# Patient Record
Sex: Male | Born: 1950
Health system: Southern US, Community
[De-identification: ages and names within clinical notes are randomized; demographics above are authoritative.]

## PROBLEM LIST (undated history)

## (undated) DIAGNOSIS — L709 Acne, unspecified: Secondary | ICD-10-CM

## (undated) DIAGNOSIS — M79605 Pain in left leg: Secondary | ICD-10-CM

## (undated) DIAGNOSIS — M51379 Other intervertebral disc degeneration, lumbosacral region without mention of lumbar back pain or lower extremity pain: Secondary | ICD-10-CM

## (undated) DIAGNOSIS — F419 Anxiety disorder, unspecified: Secondary | ICD-10-CM

## (undated) DIAGNOSIS — M79604 Pain in right leg: Secondary | ICD-10-CM

## (undated) DIAGNOSIS — K5730 Diverticulosis of large intestine without perforation or abscess without bleeding: Secondary | ICD-10-CM

## (undated) DIAGNOSIS — M5137 Other intervertebral disc degeneration, lumbosacral region: Secondary | ICD-10-CM

## (undated) DIAGNOSIS — M545 Low back pain, unspecified: Secondary | ICD-10-CM

## (undated) HISTORY — DX: Diverticulosis of large intestine without perforation or abscess without bleeding: K57.30

## (undated) HISTORY — DX: Other intervertebral disc degeneration, lumbosacral region without mention of lumbar back pain or lower extremity pain: M51.379

## (undated) HISTORY — PX: TONSILLECTOMY: SUR1361

## (undated) HISTORY — DX: Pain in left leg: M79.605

## (undated) HISTORY — PX: REFRACTIVE SURGERY: SHX103

## (undated) HISTORY — DX: Low back pain, unspecified: M54.50

## (undated) HISTORY — DX: Acne, unspecified: L70.9

## (undated) HISTORY — PX: HERNIA REPAIR: SHX51

## (undated) HISTORY — DX: Low back pain: M54.5

## (undated) HISTORY — PX: COLONOSCOPY: SHX174

## (undated) HISTORY — DX: Pain in right leg: M79.604

## (undated) HISTORY — DX: Other intervertebral disc degeneration, lumbosacral region: M51.37

---

## 2010-09-22 ENCOUNTER — Ambulatory Visit (INDEPENDENT_AMBULATORY_CARE_PROVIDER_SITE_OTHER): Payer: BC Managed Care – PPO | Admitting: Internal Medicine

## 2010-09-22 ENCOUNTER — Other Ambulatory Visit (INDEPENDENT_AMBULATORY_CARE_PROVIDER_SITE_OTHER): Payer: BC Managed Care – PPO

## 2010-09-22 ENCOUNTER — Encounter: Payer: Self-pay | Admitting: Internal Medicine

## 2010-09-22 VITALS — BP 118/80 | HR 60 | Temp 98.6°F | Resp 16 | Ht 69.0 in | Wt 215.0 lb

## 2010-09-22 DIAGNOSIS — M545 Low back pain, unspecified: Secondary | ICD-10-CM | POA: Insufficient documentation

## 2010-09-22 DIAGNOSIS — IMO0002 Reserved for concepts with insufficient information to code with codable children: Secondary | ICD-10-CM

## 2010-09-22 DIAGNOSIS — Z0000 Encounter for general adult medical examination without abnormal findings: Secondary | ICD-10-CM | POA: Insufficient documentation

## 2010-09-22 DIAGNOSIS — Z136 Encounter for screening for cardiovascular disorders: Secondary | ICD-10-CM

## 2010-09-22 DIAGNOSIS — M5416 Radiculopathy, lumbar region: Secondary | ICD-10-CM

## 2010-09-22 LAB — COMPREHENSIVE METABOLIC PANEL
Albumin: 3.7 g/dL (ref 3.5–5.2)
BUN: 14 mg/dL (ref 6–23)
CO2: 26 mEq/L (ref 19–32)
Calcium: 8.7 mg/dL (ref 8.4–10.5)
GFR: 82.64 mL/min (ref >60.00)
Glucose, Bld: 95 mg/dL (ref 70–99)
Potassium: 4.5 mEq/L (ref 3.5–5.1)
Sodium: 141 mEq/L (ref 135–145)
Total Protein: 6.7 g/dL (ref 6.0–8.3)

## 2010-09-22 LAB — CBC WITH DIFFERENTIAL/PLATELET
Basophils Relative: 0.3 % (ref 0.0–3.0)
Eosinophils Relative: 2.1 % (ref 0.0–5.0)
HCT: 44 % (ref 39.0–52.0)
MCV: 90.1 fl (ref 78.0–100.0)
Monocytes Absolute: 0.3 10*3/uL (ref 0.1–1.0)
Monocytes Relative: 7 % (ref 3.0–12.0)
Neutrophils Relative %: 52.5 % (ref 43.0–77.0)
Platelets: 214 10*3/uL (ref 150.0–400.0)
RBC: 4.89 Mil/uL (ref 4.22–5.81)
WBC: 5 10*3/uL (ref 4.5–10.5)

## 2010-09-22 LAB — LIPID PANEL
HDL: 73.5 mg/dL (ref >39.00)
Total CHOL/HDL Ratio: 3
VLDL: 8.4 mg/dL (ref 0.0–40.0)

## 2010-09-22 LAB — TSH: TSH: 1.15 u[IU]/mL (ref 0.35–5.50)

## 2010-09-22 LAB — PSA: PSA: 1.03 ng/mL (ref 0.10–4.00)

## 2010-09-22 MED ORDER — NAPROXEN-ESOMEPRAZOLE 500-20 MG PO TBEC: 1.0000 | DELAYED_RELEASE_TABLET | Freq: Two times a day (BID) | ORAL | Status: DC | PRN

## 2010-09-22 NOTE — Assessment & Plan Note (Addendum)
Routine labs ordered, EKG is normal, pt ed material given, screening colonoscopy ordered

## 2010-09-22 NOTE — Assessment & Plan Note (Signed)
I have ordered an MRI to look for HNP, spinal stenosis, DDD, tumor, etc.

## 2010-09-22 NOTE — Assessment & Plan Note (Signed)
Will check an MRI to look for hnp, mass, spinal stenosis, nerve impingement, etc.

## 2010-09-22 NOTE — Patient Instructions (Signed)
Back Pain (Lumbosacral Strain) Back pain is one of the most common causes of pain. There are many causes of back pain. Most are not serious conditions.  CAUSES Your backbone (spinal column) is made up of 24 main vertebral bodies, the sacrum, and the coccyx. These are held together by muscles and tough, fibrous tissue (ligaments). Nerve roots pass through the openings between the vertebrae. A sudden move or injury to the back may cause injury to, or pressure on, these nerves. This may result in localized back pain or pain movement (radiation) into the buttocks, down the leg, and into the foot. Sharp, shooting pain from the buttock down the back of the leg (sciatica) is frequently associated with a ruptured (herniated) disc. Pain may be caused by muscle spasm alone. Your caregiver can often find the cause of your pain by the details of your symptoms and an exam. In some cases, you may need tests (such as X-rays). Your caregiver will work with you to decide if any tests are needed based on your specific exam. HOME CARE INSTRUCTIONS  Avoid an underactive lifestyle. Active exercise, as directed by your caregiver, is your greatest weapon against back pain.   Avoid hard physical activities (tennis, racquetball, water-skiing) if you are not in proper physical condition for it. This may aggravate and/or create problems.   If you have a back problem, avoid sports requiring sudden body movements. Swimming and walking are generally safer activities.   Maintain good posture.   Avoid becoming overweight (obese).   Use bed rest for only the most extreme, sudden (acute) episode. Your caregiver will help you determine how much bed rest is necessary.   For acute conditions, you may put ice on the injured area.   Put ice in a plastic bag.   Place a towel between your skin and the bag.   Leave the ice on for 20 minutes at a time, every 2 hours, or as needed.   After you are improved and more active, it may  help to apply heat for 30 minutes before activities.  See your caregiver if you are having pain that lasts longer than expected. Your caregiver can advise appropriate exercises and/or therapy if needed. With conditioning, most back problems can be avoided. SEEK IMMEDIATE MEDICAL CARE IF:  You have numbness, tingling, weakness, or problems with the use of your arms or legs.   You experience severe back pain not relieved with medicines.   There is a change in bowel or bladder control.   You have increasing pain in any area of the body, including your belly (abdomen).   You notice shortness of breath, dizziness, or feel faint.   You feel sick to your stomach (nauseous), are throwing up (vomiting), or become sweaty.   You notice discoloration of your toes or legs, or your feet get very cold.   Your back pain is getting worse.  You have an oral temperature above 100.5Health Maintenance in Males MAINTAIN REGULAR HEALTH EXAMS Maintain a healthy diet and normal weight. Increased weight leads to problems with blood pressure and diabetes. Decrease fat in the diet and increase exercise. Obtain a proper diet from your caregiver if necessary.  High blood pressure causes heart and blood vessel problems. Check blood pressures regularly and keep your blood pressure at normal limits. Aerobic exercise helps this. Persistent elevations of blood pressure should be treated with medications if weight loss and exercise are ineffective.  Avoid smoking, drinking in excess (more than 2 drinks per  day), or use of street drugs. Do not share needles with anyone. Ask for help if you need assistance or instructions on stopping the use of alcohol, cigarettes, or drugs.  Maintain normal blood lipids and cholesterol. Your caregiver can give you information to lower your risk of heart disease or stroke.  Ask your caregiver if you are in need of early heart disease screening because of a strong family history of heart disease  or signs of elevated testosterone (male sex hormone) levels. These can predispose you to early heart disease.  Practice safe sex. Practicing safe sex decreases your risk for a sexually transmitted infection (STI). Some of the STIs are gonorrhea, chlamydia, syphilis, trichimonas, herpes, human papillomavirus (HPV), and human immunodeficiency virus (HIV). Herpes, HIV, and HPV are viral illnesses that have no cure. These can result in disability, cancer, and death.  It is not safe for someone who has AIDS or is HIV positive to have unprotected sex with a partner who is HIV positive. The reason for this is the fact that there are many different strains of HIV. If you have a strain that is readily treated with medications and then suddenly introduce a strain from a partner that has no further treatment options, you may suddenly have a strain of HIV that is untreatable. Even if you are both positive for HIV, it is still necessary to practice safe sex.  Use sunscreen with a SPF of 15 or greater. Being outside in the sun when your shadow caused by the sun is shorter than you are, means you are being exposed to sun at greater intensity. Lighter skinned people are at a greater risk of skin cancer.  Keep carbon monoxide and smoke detectors in your home and functioning at all times. Change the batteries every 6 months.  Do monthly examinations of your testicles. The best time to do this is after a hot shower or bath when the tissues are loose. Notify your caregivers of any lumps, tenderness, or changes in size or shape.  Notify your caregiver of new moles or changes in moles, especially if there is a change in shape or color. Also notify your caregiver if a mole is larger than the size of a pencil eraser.  Stay current with your tetanus shots and other required immunizations.  The Body Mass Index (BMI) is a way of measuring how much of your body is fat. Having a BMI above 27 increases the risk of heart disease,  diabetes, hypertension, stroke, and other problems related to obesity. Document Released: 11/05/2007 Document Re-Released: 10/27/2009  [facility] Patient Information 2011 [place], [place]., not controlled by medicine.  MAKE SURE YOU:   Understand these instructions.   Will watch your condition.   Will get help right away if you are not doing well or get worse.  Document Released: 02/16/2005 Document Re-Released: 08/03/2009 [facility] Patient Information 2011 [place], [place].

## 2010-09-22 NOTE — Assessment & Plan Note (Addendum)
Start vimovo for pain and check labs to look for secondary causes

## 2010-09-22 NOTE — Progress Notes (Signed)
Subjective:    Patient ID: Daniel [name], male    DOB: 1951/05/01, 60 y.o.   MRN: [id number]  Back Pain This is a chronic problem. The current episode started more than 1 year ago. The problem occurs constantly. The problem has been gradually worsening since onset. The pain is present in the lumbar spine. The quality of the pain is described as stabbing and shooting. The pain radiates to the left knee and right knee. The pain is at a severity of 4/10. The pain is mild. The pain is worse during the day. The symptoms are aggravated by bending. Stiffness is present all day. Pertinent negatives include no abdominal pain, bladder incontinence, bowel incontinence, chest pain, dysuria, fever, headaches, leg pain, numbness, paresis, paresthesias, pelvic pain, perianal numbness, tingling, weakness or weight loss. Risk factors include sedentary lifestyle. He has tried chiropractic manipulation for the symptoms. The treatment provided no relief.   Also, he wants to do a complete physical today.   Review of Systems  Constitutional: Positive for unexpected weight change (weight gain). Negative for fever, chills, weight loss, diaphoresis, activity change, appetite change and fatigue.  HENT: Negative for facial swelling, neck pain and neck stiffness.   Eyes: Negative for photophobia and visual disturbance.  Respiratory: Negative for apnea, cough, choking, chest tightness, shortness of breath, wheezing and stridor.   Cardiovascular: Negative for chest pain, palpitations and leg swelling.  Gastrointestinal: Negative for nausea, vomiting, abdominal pain, diarrhea, blood in stool and bowel incontinence.  Genitourinary: Negative for bladder incontinence, dysuria, urgency, frequency, hematuria, flank pain, decreased urine volume, discharge, penile swelling, scrotal swelling, enuresis, difficulty urinating, genital sores, penile pain, testicular pain and pelvic pain.  Musculoskeletal: Positive for back pain. Negative  for myalgias, joint swelling, arthralgias and gait problem.  Skin: Negative for color change, pallor and rash.  Neurological: Negative for dizziness, tingling, tremors, seizures, syncope, facial asymmetry, speech difficulty, weakness, light-headedness, numbness, headaches and paresthesias.  Hematological: Negative for adenopathy. Does not bruise/bleed easily.  Psychiatric/Behavioral: Negative for suicidal ideas, hallucinations, behavioral problems, confusion, sleep disturbance, self-injury, dysphoric mood, decreased concentration and agitation. The patient is not nervous/anxious and is not hyperactive.        Objective:   Physical Exam  Vitals reviewed. Constitutional: He appears well-developed and well-nourished. No distress.  HENT:  Head: Normocephalic and atraumatic.  Right Ear: External ear normal.  Left Ear: External ear normal.  Nose: Nose normal.  Mouth/Throat: Oropharynx is clear and moist. No oropharyngeal exudate.  Eyes: Conjunctivae and EOM are normal. Pupils are equal, round, and reactive to light. Right eye exhibits no discharge. Left eye exhibits no discharge. No scleral icterus.  Neck: Normal range of motion. Neck supple. No JVD present. No tracheal deviation present. No thyromegaly present.  Cardiovascular: Normal rate, regular rhythm, normal heart sounds and intact distal pulses.  Exam reveals no gallop and no friction rub.   No murmur heard. Pulmonary/Chest: Breath sounds normal. No stridor. No respiratory distress. He has no wheezes. He has no rales. He exhibits no tenderness.  Abdominal: Soft. Bowel sounds are normal. He exhibits no distension and no mass. There is no tenderness. There is no rebound and no guarding. Hernia confirmed negative in the right inguinal area and confirmed negative in the left inguinal area.  Genitourinary: Rectum normal, prostate normal, testes normal and penis normal. Rectal exam shows no external hemorrhoid, no internal hemorrhoid, no fissure,  no mass, no tenderness and anal tone normal. Guaiac negative stool. Prostate is not enlarged and not tender. Right  testis shows no mass, no swelling and no tenderness. Left testis shows no mass, no swelling and no tenderness. Circumcised. No penile erythema or penile tenderness. No discharge found.  Musculoskeletal: He exhibits no edema and no tenderness.       Lumbar back: He exhibits decreased range of motion and pain. He exhibits no tenderness, no bony tenderness, no swelling, no edema, no deformity and no spasm.  Lymphadenopathy:    He has no cervical adenopathy.       Right: No inguinal adenopathy present.       Left: No inguinal adenopathy present.  Neurological: He is alert. He has normal strength. He is not disoriented. He displays no atrophy, no tremor and normal reflexes. No cranial nerve deficit or sensory deficit. He exhibits normal muscle tone. He displays a negative Romberg sign. He displays no seizure activity. Coordination and gait normal.  Reflex Scores:      Tricep reflexes are 1+ on the right side and 1+ on the left side.      Bicep reflexes are 1+ on the right side and 1+ on the left side.      Brachioradialis reflexes are 1+ on the right side and 1+ on the left side.      Patellar reflexes are 1+ on the right side and 1+ on the left side.      Achilles reflexes are 1+ on the right side and 1+ on the left side. Skin: Skin is warm and dry. No rash noted. He is not diaphoretic. No erythema. No pallor.  Psychiatric: He has a normal mood and affect. His behavior is normal. Judgment and thought content normal.          Assessment & Plan:

## 2010-09-28 ENCOUNTER — Ambulatory Visit (AMBULATORY_SURGERY_CENTER): Payer: BC Managed Care – PPO

## 2010-09-28 ENCOUNTER — Encounter: Payer: Self-pay | Admitting: Gastroenterology

## 2010-09-28 VITALS — Ht 69.0 in | Wt 219.3 lb

## 2010-09-28 DIAGNOSIS — Z1211 Encounter for screening for malignant neoplasm of colon: Secondary | ICD-10-CM

## 2010-09-28 MED ORDER — PEG-KCL-NACL-NASULF-NA ASC-C 100 G PO SOLR: 1.0000 | Freq: Once | ORAL | Status: AC

## 2010-09-30 ENCOUNTER — Encounter: Payer: Self-pay | Admitting: Internal Medicine

## 2010-09-30 ENCOUNTER — Ambulatory Visit (HOSPITAL_COMMUNITY)
Admission: RE | Admit: 2010-09-30 | Discharge: 2010-09-30 | Disposition: A | Discharge status: Home / Self Care | Payer: BC Managed Care – PPO | Source: Ambulatory Visit | Attending: Internal Medicine | Admitting: Internal Medicine

## 2010-09-30 DIAGNOSIS — M5137 Other intervertebral disc degeneration, lumbosacral region: Secondary | ICD-10-CM | POA: Insufficient documentation

## 2010-09-30 DIAGNOSIS — M545 Low back pain, unspecified: Secondary | ICD-10-CM | POA: Insufficient documentation

## 2010-09-30 DIAGNOSIS — M51379 Other intervertebral disc degeneration, lumbosacral region without mention of lumbar back pain or lower extremity pain: Secondary | ICD-10-CM | POA: Insufficient documentation

## 2010-09-30 DIAGNOSIS — M47817 Spondylosis without myelopathy or radiculopathy, lumbosacral region: Secondary | ICD-10-CM | POA: Insufficient documentation

## 2010-09-30 DIAGNOSIS — R209 Unspecified disturbances of skin sensation: Secondary | ICD-10-CM | POA: Insufficient documentation

## 2010-09-30 DIAGNOSIS — Q7649 Other congenital malformations of spine, not associated with scoliosis: Secondary | ICD-10-CM | POA: Insufficient documentation

## 2010-09-30 DIAGNOSIS — M5416 Radiculopathy, lumbar region: Secondary | ICD-10-CM

## 2010-09-30 DIAGNOSIS — M79609 Pain in unspecified limb: Secondary | ICD-10-CM | POA: Insufficient documentation

## 2010-10-01 ENCOUNTER — Telehealth: Payer: Self-pay | Admitting: *Deleted

## 2010-10-01 DIAGNOSIS — M5416 Radiculopathy, lumbar region: Secondary | ICD-10-CM

## 2010-10-01 DIAGNOSIS — M545 Low back pain, unspecified: Secondary | ICD-10-CM

## 2010-10-01 MED ORDER — NAPROXEN-ESOMEPRAZOLE 500-20 MG PO TBEC: 1.0000 | DELAYED_RELEASE_TABLET | Freq: Two times a day (BID) | ORAL | Status: DC | PRN

## 2010-10-01 NOTE — Telephone Encounter (Signed)
done

## 2010-10-01 NOTE — Telephone Encounter (Signed)
Patient informed - Pharm told pt that he needed PA. Explained PA process, no samples avail at this time. He is taking otc aleve but says vimovo helps more than otc med. Advised pt take some PPI/acid reducer of choice while taking aleve to protect his stomach and would for forward with PA   SHARON, please help, when you see this PA, I will help complete to get covered. THANKS!

## 2010-10-01 NOTE — Telephone Encounter (Signed)
Patient requesting RX for "pain killer" he was given samples of at [facility].

## 2010-10-06 NOTE — Telephone Encounter (Signed)
Completed form faxed for Approval.

## 2010-10-06 NOTE — Telephone Encounter (Signed)
[place], [place] # [phone] ID# [id number] Form received, completed and pending signature

## 2010-10-06 NOTE — Telephone Encounter (Signed)
Still have not seen/recvd PA for this Pt.?

## 2010-10-07 NOTE — Telephone Encounter (Signed)
Approval recvd & faxed to pharmacy. Pt informed.

## 2010-10-11 ENCOUNTER — Ambulatory Visit (AMBULATORY_SURGERY_CENTER): Payer: BC Managed Care – PPO | Admitting: Gastroenterology

## 2010-10-11 ENCOUNTER — Encounter: Payer: Self-pay | Admitting: Gastroenterology

## 2010-10-11 VITALS — BP 129/76 | HR 56 | Temp 99.0°F | Resp 20 | Ht 69.0 in | Wt 215.0 lb

## 2010-10-11 DIAGNOSIS — Z1211 Encounter for screening for malignant neoplasm of colon: Secondary | ICD-10-CM

## 2010-10-11 DIAGNOSIS — K5730 Diverticulosis of large intestine without perforation or abscess without bleeding: Secondary | ICD-10-CM | POA: Insufficient documentation

## 2010-10-11 MED ORDER — SODIUM CHLORIDE 0.9 % IV SOLN: 500.0000 mL | INTRAVENOUS | Status: DC

## 2010-10-11 NOTE — Patient Instructions (Signed)
Discharged instructions given with verbal understanding. Handout on diverticulosis given. Resume previous medications. 

## 2010-10-12 ENCOUNTER — Telehealth: Payer: Self-pay

## 2010-10-12 NOTE — Telephone Encounter (Signed)
Left message on answering machine. 

## 2010-11-23 ENCOUNTER — Encounter: Payer: Self-pay | Admitting: Internal Medicine

## 2010-11-23 ENCOUNTER — Ambulatory Visit (INDEPENDENT_AMBULATORY_CARE_PROVIDER_SITE_OTHER): Payer: BC Managed Care – PPO | Admitting: Internal Medicine

## 2010-11-23 ENCOUNTER — Other Ambulatory Visit: Payer: Self-pay | Admitting: *Deleted

## 2010-11-23 VITALS — BP 148/92 | HR 77 | Temp 98.0°F | Resp 14 | Wt 214.2 lb

## 2010-11-23 DIAGNOSIS — J209 Acute bronchitis, unspecified: Secondary | ICD-10-CM

## 2010-11-23 MED ORDER — HYDROCODONE-HOMATROPINE 5-1.5 MG/5ML PO SYRP: 5.0000 mL | ORAL_SOLUTION | Freq: Four times a day (QID) | ORAL | Status: AC | PRN

## 2010-11-23 MED ORDER — LEVOFLOXACIN 500 MG PO TABS: 500.0000 mg | ORAL_TABLET | Freq: Every day | ORAL | Status: AC

## 2010-11-23 MED ORDER — VALACYCLOVIR HCL 1 G PO TABS: 1000.0000 mg | ORAL_TABLET | Freq: Three times a day (TID) | ORAL | Status: AC | PRN

## 2010-11-23 NOTE — Progress Notes (Signed)
  Subjective:    Patient ID: Daniel [name], male    DOB: [birth year], 60 y.o.   MRN: [id number]  HPI Here with acute onset mild to mod 2-3 days ST, HA, general weakness and malaise, with prod cough greenish sputum, but Pt denies chest pain, increased sob or doe, wheezing, orthopnea, PND, increased LE swelling, palpitations, dizziness or syncope. Pt denies new neurological symptoms such as new headache, or facial or extremity weakness or numbness.  Pt denies polydipsia, polyuria. Past Medical History  Diagnosis Date  . Acne   . Low back pain radiating to both legs   . Neuromuscular disorder     chronic back pain   Past Surgical History  Procedure Date  . Hernia repair   . Tonsillectomy   . Refractive surgery     left eye/ with enhancement    reports that he has never smoked. He has never used smokeless tobacco. He reports that he drinks about 8.4 ounces of alcohol per week. He reports that he does not use illicit drugs. family history includes Arthritis in his father and mother; Diabetes in his mother; Heart disease in his father; and Hypertension in his mother.  There is no history of Cancer. No Known Allergies Current Outpatient Prescriptions on File Prior to Visit  Medication Sig Dispense Refill  . DOXYCYCLINE, ROSACEA, PO Take by mouth as needed.       Marland Kitchen glucosamine-chondroitin 500-400 MG tablet Take 1 tablet by mouth every morning.        . Multiple Vitamins-Minerals (SENIOR MULTIVITAMIN PLUS) TABS Take 1 tablet by mouth every morning.        . Naproxen-Esomeprazole 500-20 MG TBEC Take 1 tablet by mouth 2 (two) times daily as needed (pain).  60 tablet  2  . Omega-3 Fatty Acids (FISH OIL) 1200 MG CAPS Take 1 capsule by mouth every morning.        . vitamin B-12 (CYANOCOBALAMIN) 1000 MCG tablet Take 1,000 mcg by mouth daily.         Current Facility-Administered Medications on File Prior to Visit  Medication Dose Route Frequency Provider Last Rate Last Dose  . 0.9 %  sodium chloride  infusion  500 mL Intravenous Continuous Sheryn Bison, MD       Review of Systems Review of Systems  Constitutional: Negative for diaphoresis and unexpected weight change.  HENT: Negative for drooling and tinnitus.   Eyes: Negative for photophobia and visual disturbance.  Respiratory: Negative for choking and stridor.         Objective:   Physical Exam BP 148/92  Pulse 77  Temp(Src) 98 F (36.7 C) (Oral)  Resp 14  Wt 214 lb 4 oz (97.183 kg)  SpO2 97%Physical Exam  VS noted, mild ill Constitutional: Pt appears well-developed and well-nourished.  HENT: Head: Normocephalic.  Right Ear: External ear normal.  Left Ear: External ear normal.  Bilat tm's mild erythema.  Sinus nontender.  Pharynx mild erythema Eyes: Conjunctivae and EOM are normal. Pupils are equal, round, and reactive to light.  Neck: Normal range of motion. Neck supple.  Cardiovascular: Normal rate and regular rhythm.   Pulmonary/Chest: Effort normal and breath sounds normal.  Neurological: Pt is alert. No cranial nerve deficit.  Skin: Skin is warm. No erythema.  Psychiatric: Pt behavior is normal. Thought content normal.         Assessment & Plan:

## 2010-11-23 NOTE — Assessment & Plan Note (Signed)
Mild to mod, for antibx course,  to f/u any worsening symptoms or concerns 

## 2010-11-23 NOTE — Patient Instructions (Signed)
Take all new medications as prescribed Continue all other medications as before  

## 2011-04-11 ENCOUNTER — Telehealth: Payer: Self-pay | Admitting: Gastroenterology

## 2011-04-11 ENCOUNTER — Telehealth: Payer: Self-pay

## 2011-04-11 NOTE — Telephone Encounter (Signed)
Patient called upset that he was billed for a colonoscopy in which his insurance company pays for as preventative. I advised patient that billing would have come from GI Dr. Jarold Motto office. I recommended that patient calls their number to resolve. Patient was given phone number and transferred to their floor.

## 2011-04-12 NOTE — Telephone Encounter (Signed)
Told patient that his colonscopy was supposed to be filed as a screening colonoscopy and not a diagnostic.  I emailed Ina Kick to get this corrected and refiled.

## 2011-04-19 ENCOUNTER — Encounter: Payer: Self-pay | Admitting: Internal Medicine

## 2011-04-19 ENCOUNTER — Ambulatory Visit (INDEPENDENT_AMBULATORY_CARE_PROVIDER_SITE_OTHER): Payer: BC Managed Care – PPO | Admitting: Internal Medicine

## 2011-04-19 VITALS — BP 112/90 | HR 74 | Temp 98.7°F | Ht 69.0 in | Wt 218.5 lb

## 2011-04-19 DIAGNOSIS — M5137 Other intervertebral disc degeneration, lumbosacral region: Secondary | ICD-10-CM

## 2011-04-19 DIAGNOSIS — IMO0002 Reserved for concepts with insufficient information to code with codable children: Secondary | ICD-10-CM

## 2011-04-19 DIAGNOSIS — M545 Low back pain, unspecified: Secondary | ICD-10-CM

## 2011-04-19 DIAGNOSIS — M5416 Radiculopathy, lumbar region: Secondary | ICD-10-CM

## 2011-04-19 MED ORDER — BUPRENORPHINE 10 MCG/HR TD PTWK: 1.0000 | MEDICATED_PATCH | TRANSDERMAL | Status: DC

## 2011-04-19 MED ORDER — METHYLPREDNISOLONE ACETATE 80 MG/ML IJ SUSP
120.0000 mg | Freq: Once | INTRAMUSCULAR | Status: AC
  Administered 2011-04-19: 120 mg via INTRAMUSCULAR

## 2011-04-19 NOTE — Patient Instructions (Signed)
Degenerative Disc Disease Degenerative disc disease is a condition caused by the changes that occur in the cushions of the backbone (spinal discs) as you grow older. Spinal discs are soft and compressible discs located between the bones of the spine (vertebrae). They act like shock absorbers. Degenerative disc disease can affect the wholespine. However, the neck and lower back are most commonly affected. Many changes can occur in the spinal discs with aging, such as:  The spinal discs may dry and shrink.   Small tears may occur in the tough, outer covering of the disc (annulus).   The disc space may become smaller due to loss of water.   Abnormal growths in the bone (spurs) may occur. This can put pressure on the nerve roots exiting the spinal canal, causing pain.   The spinal canal may become narrowed.  CAUSES  Degenerative disc disease is a condition caused by the changes that occur in the spinal discs with aging. The exact cause is not known, but there is a genetic basis for many patients. Degenerative changes can occur due to loss of fluid in the disc. This makes the disc thinner and reduces the space between the backbones. Small cracks can develop in the outer layer of the disc. This can lead to the breakdown of the disc. You are more likely to get degenerative disc disease if you are overweight. Smoking cigarettes and doing heavy work such as weightlifting can also increase your risk of this condition. Degenerative changes can start after a sudden injury. Growth of bone spurs can compress the nerve roots and cause pain.  SYMPTOMS  The symptoms vary from person to person. Some people may have no pain, while others have severe pain. The pain may be so severe that it can limit your activities. The location of the pain depends on the part of your backbone that is affected. You will have neck or arm pain if a disc in the neck area is affected. You will have pain in your back, buttocks, or legs if a  disc in the lower back is affected. The pain becomes worse while bending, reaching up, or with twisting movements. The pain may start gradually and then get worse as time passes. It may also start after a major or minor injury. You may feel numbness or tingling in the arms or legs.  DIAGNOSIS  Your caregiver will ask you about your symptoms and about activities or habits that may cause the pain. He or she may also ask about any injuries, diseases, ortreatments you have had earlier. Your caregiver will examine you to check for the range of movement that is possible in the affected area, to check for strength in your extremities, and to check for sensation in the areas of the arms and legs supplied by different nerve roots. An X-ray of the spine may be taken. Your caregiver may suggest other imaging tests, such as a computerized magnetic scan (MRI), if needed.  TREATMENT  Treatment includes rest, modifying your activities, and applying ice and heat. Your caregiver may prescribe medicines to reduce your pain and may ask you to do some exercises to strengthen your back. In some cases, you may need surgery. You and your caregiver will decide on the treatment that is best for you. HOME CARE INSTRUCTIONS   Follow proper lifting and walking techniques as advised by your caregiver.   Maintain good posture.   Exercise regularly as advised.   Perform relaxation exercises.   Change your sitting,   standing, and sleeping habits as advised. Change positions frequently.   Lose weight as advised.   Stop smoking if you smoke.   Wear supportive footwear.  SEEK MEDICAL CARE IF:  The pain does not go away within 1 to 4 weeks. SEEK IMMEDIATE MEDICAL CARE IF:   The pain is severe.   You notice weakness in your arms, hands, or legs.   You begin to lose control of your bladder or bowel.  MAKE SURE YOU:   Understand these instructions.   Will watch your condition.   Will get help right away if you are not  doing well or get worse.  Document Released: 03/06/2007 Document Revised: 01/19/2011 Document Reviewed: 03/06/2007 [employer] Patient Information 2012 [employer]. 

## 2011-04-20 NOTE — Assessment & Plan Note (Signed)
He can continue the vimovo and will add on butrans patch as well

## 2011-04-20 NOTE — Assessment & Plan Note (Signed)
I will see if an injection of depo-medrol will help

## 2011-04-20 NOTE — Assessment & Plan Note (Signed)
His MRI shows DDD with nerve impingement so I have asked him to see pain management to see if an ESI will help, he may also consider surgery but he is not ready for that yet

## 2011-04-20 NOTE — Progress Notes (Signed)
Subjective:    Patient ID: Daniel [name], male    DOB: 1951-03-22, 60 y.o.   MRN: [id number]  Back Pain This is a recurrent problem. The current episode started in the past 7 days. The problem occurs constantly. The problem has been gradually worsening since onset. The pain is present in the lumbar spine. The quality of the pain is described as shooting. The pain radiates to the right thigh. The pain is at a severity of 4/10. The pain is moderate. The pain is worse during the day. The symptoms are aggravated by bending. Stiffness is present all day. Associated symptoms include leg pain. Pertinent negatives include no abdominal pain, bladder incontinence, bowel incontinence, chest pain, dysuria, fever, headaches, numbness, paresis, paresthesias, pelvic pain, perianal numbness, tingling, weakness or weight loss. Risk factors include lack of exercise and obesity. He has tried NSAIDs for the symptoms. The treatment provided moderate relief.      Review of Systems  Constitutional: Negative for fever, chills, weight loss, diaphoresis, activity change, appetite change, fatigue and unexpected weight change.  HENT: Negative.   Eyes: Negative.   Respiratory: Negative for cough, chest tightness, shortness of breath, wheezing and stridor.   Cardiovascular: Negative for chest pain, palpitations and leg swelling.  Gastrointestinal: Negative for nausea, vomiting, abdominal pain, diarrhea, constipation, abdominal distention and bowel incontinence.  Genitourinary: Negative for bladder incontinence, dysuria, urgency, frequency, hematuria, flank pain, decreased urine volume, enuresis, difficulty urinating and pelvic pain.  Musculoskeletal: Positive for back pain. Negative for myalgias, joint swelling, arthralgias and gait problem.  Skin: Negative for color change, pallor, rash and wound.  Neurological: Negative for dizziness, tingling, tremors, seizures, syncope, facial asymmetry, speech difficulty, weakness,  light-headedness, numbness, headaches and paresthesias.  Hematological: Negative for adenopathy. Does not bruise/bleed easily.  Psychiatric/Behavioral: Negative.        Objective:   Physical Exam  Vitals reviewed. Constitutional: He is oriented to person, place, and time. He appears well-developed and well-nourished. No distress.  HENT:  Head: Normocephalic and atraumatic.  Mouth/Throat: Oropharynx is clear and moist. No oropharyngeal exudate.  Eyes: Conjunctivae are normal. Right eye exhibits no discharge. Left eye exhibits no discharge. No scleral icterus.  Neck: Normal range of motion. Neck supple. No JVD present. No tracheal deviation present. No thyromegaly present.  Cardiovascular: Normal rate, regular rhythm, normal heart sounds and intact distal pulses.  Exam reveals no gallop and no friction rub.   No murmur heard. Pulmonary/Chest: Effort normal and breath sounds normal. No stridor. No respiratory distress. He has no wheezes. He has no rales. He exhibits no tenderness.  Abdominal: Soft. Bowel sounds are normal. He exhibits no distension and no mass. There is no tenderness. There is no rebound and no guarding.  Musculoskeletal: Normal range of motion. He exhibits no edema and no tenderness.       Lumbar back: Normal. He exhibits normal range of motion, no tenderness, no bony tenderness, no swelling, no edema, no deformity, no laceration, no pain, no spasm and normal pulse.       + SLR in right leg - SLR in left leg  Lymphadenopathy:    He has no cervical adenopathy.  Neurological: He is alert and oriented to person, place, and time. He displays no atrophy, no tremor and normal reflexes. No cranial nerve deficit or sensory deficit. He exhibits abnormal muscle tone. He displays a negative Romberg sign. He displays no seizure activity. Coordination and gait normal. He displays no Babinski's sign on the right side. He displays no  Babinski's sign on the left side.  Reflex Scores:       Tricep reflexes are 1+ on the right side and 1+ on the left side.      Bicep reflexes are 1+ on the right side and 1+ on the left side.      Brachioradialis reflexes are 1+ on the right side and 1+ on the left side.      Patellar reflexes are 1+ on the right side and 1+ on the left side.      Achilles reflexes are 1+ on the right side and 1+ on the left side.      He has mild weakness throughout his RLE  Skin: Skin is warm and dry. No rash noted. He is not diaphoretic. No erythema. No pallor.  Psychiatric: He has a normal mood and affect. His behavior is normal. Judgment and thought content normal.          Assessment & Plan:

## 2011-05-04 ENCOUNTER — Telehealth: Payer: Self-pay

## 2011-05-04 NOTE — Telephone Encounter (Signed)
Patient called lmovm c/o cold congestion.

## 2011-05-05 ENCOUNTER — Ambulatory Visit (INDEPENDENT_AMBULATORY_CARE_PROVIDER_SITE_OTHER): Payer: BC Managed Care – PPO | Admitting: Internal Medicine

## 2011-05-05 ENCOUNTER — Encounter: Payer: Self-pay | Admitting: Internal Medicine

## 2011-05-05 VITALS — BP 142/80 | HR 57 | Temp 98.2°F | Resp 16 | Wt 217.0 lb

## 2011-05-05 DIAGNOSIS — M545 Low back pain, unspecified: Secondary | ICD-10-CM

## 2011-05-05 DIAGNOSIS — J019 Acute sinusitis, unspecified: Secondary | ICD-10-CM

## 2011-05-05 MED ORDER — CEFUROXIME AXETIL 500 MG PO TABS: 500.0000 mg | ORAL_TABLET | Freq: Two times a day (BID) | ORAL | Status: DC

## 2011-05-05 NOTE — Patient Instructions (Signed)

## 2011-05-05 NOTE — Progress Notes (Signed)
Subjective:    Patient ID: Daniel [name], male    DOB: 1950-08-20, 60 y.o.   MRN: [id number]  Sinusitis This is a new problem. The current episode started in the past 7 days. The problem has been gradually worsening since onset. There has been no fever. His pain is at a severity of 0/10. He is experiencing no pain. Associated symptoms include chills, congestion, headaches, sinus pressure and a sore throat. Pertinent negatives include no coughing, diaphoresis, ear pain, hoarse voice, neck pain, shortness of breath, sneezing or swollen glands. Past treatments include nothing.      Review of Systems  Constitutional: Positive for chills and fatigue. Negative for fever, diaphoresis, activity change, appetite change and unexpected weight change.  HENT: Positive for congestion, sore throat and sinus pressure. Negative for ear pain, hoarse voice, facial swelling, sneezing, drooling, mouth sores, trouble swallowing, neck pain, neck stiffness, dental problem, voice change and ear discharge.   Eyes: Negative.   Respiratory: Negative for cough, shortness of breath, wheezing and stridor.   Cardiovascular: Negative for chest pain, palpitations and leg swelling.  Gastrointestinal: Negative for nausea, vomiting, abdominal pain, diarrhea, constipation, blood in stool and abdominal distention.  Genitourinary: Negative for dysuria, urgency, frequency, hematuria, flank pain, decreased urine volume, enuresis and difficulty urinating.  Musculoskeletal: Positive for back pain (pain is much better on butrans patch). Negative for myalgias, joint swelling, arthralgias and gait problem.  Skin: Negative for color change, pallor, rash and wound.  Neurological: Positive for headaches. Negative for dizziness, tremors, seizures, syncope, facial asymmetry, speech difficulty, weakness, light-headedness and numbness.  Hematological: Negative for adenopathy. Does not bruise/bleed easily.  Psychiatric/Behavioral: Negative.        Objective:   Physical Exam  Vitals reviewed. Constitutional: He is oriented to person, place, and time. He appears well-developed and well-nourished. No distress.  HENT:  Head: No trismus in the jaw.  Right Ear: Hearing, tympanic membrane, external ear and ear canal normal.  Left Ear: Hearing, tympanic membrane, external ear and ear canal normal.  Nose: No mucosal edema, rhinorrhea, nose lacerations, sinus tenderness, nasal deformity, septal deviation or nasal septal hematoma. No epistaxis.  No foreign bodies. Right sinus exhibits maxillary sinus tenderness and frontal sinus tenderness. Left sinus exhibits maxillary sinus tenderness and frontal sinus tenderness.  Mouth/Throat: Oropharynx is clear and moist and mucous membranes are normal. Mucous membranes are not pale, not dry and not cyanotic. No uvula swelling. No oropharyngeal exudate, posterior oropharyngeal edema, posterior oropharyngeal erythema or tonsillar abscesses.  Eyes: Conjunctivae are normal. Right eye exhibits no discharge. Left eye exhibits no discharge. No scleral icterus.  Neck: Normal range of motion. Neck supple. No JVD present. No tracheal deviation present. No thyromegaly present.  Cardiovascular: Normal rate, regular rhythm, normal heart sounds and intact distal pulses.  Exam reveals no gallop and no friction rub.   No murmur heard. Pulmonary/Chest: Effort normal and breath sounds normal. No stridor. No respiratory distress. He has no wheezes. He has no rales. He exhibits no tenderness.  Abdominal: Soft. Bowel sounds are normal.  Musculoskeletal: Normal range of motion. He exhibits no edema and no tenderness.  Lymphadenopathy:    He has no cervical adenopathy.  Neurological: He is oriented to person, place, and time.  Skin: Skin is warm and dry. No rash noted. He is not diaphoretic. No erythema. No pallor.  Psychiatric: He has a normal mood and affect. His behavior is normal. Judgment and thought content normal.  Assessment & Plan:

## 2011-05-05 NOTE — Assessment & Plan Note (Signed)
Pain is better on butrans patch

## 2011-05-05 NOTE — Assessment & Plan Note (Signed)
Start ceftin for the infection 

## 2011-05-06 ENCOUNTER — Ambulatory Visit: Payer: BC Managed Care – PPO | Admitting: Physical Medicine & Rehabilitation

## 2011-05-06 ENCOUNTER — Encounter: Payer: BC Managed Care – PPO | Attending: Physical Medicine & Rehabilitation

## 2011-05-06 DIAGNOSIS — M5137 Other intervertebral disc degeneration, lumbosacral region: Secondary | ICD-10-CM

## 2011-05-06 DIAGNOSIS — M549 Dorsalgia, unspecified: Secondary | ICD-10-CM | POA: Insufficient documentation

## 2011-05-06 DIAGNOSIS — M48061 Spinal stenosis, lumbar region without neurogenic claudication: Secondary | ICD-10-CM

## 2011-05-06 DIAGNOSIS — M79609 Pain in unspecified limb: Secondary | ICD-10-CM | POA: Insufficient documentation

## 2011-05-06 DIAGNOSIS — M47817 Spondylosis without myelopathy or radiculopathy, lumbosacral region: Secondary | ICD-10-CM | POA: Insufficient documentation

## 2011-05-06 DIAGNOSIS — M129 Arthropathy, unspecified: Secondary | ICD-10-CM | POA: Insufficient documentation

## 2011-05-06 DIAGNOSIS — IMO0002 Reserved for concepts with insufficient information to code with codable children: Secondary | ICD-10-CM

## 2011-05-06 DIAGNOSIS — M51379 Other intervertebral disc degeneration, lumbosacral region without mention of lumbar back pain or lower extremity pain: Secondary | ICD-10-CM | POA: Insufficient documentation

## 2011-05-09 NOTE — Consult Note (Signed)
CHIEF COMPLAINT:  Back pain and right lower extremity pain.  A 60 year old male who notes onset of back pain and right lower extremity pain somewhere around 2006.  He feels like it has worsened over the last several years.  He has been trialed on Vimovo which is a combination between naproxen and Prilosec.  This improved his driving tolerance from 1 hour to 1.5 hours, but really no other significant relief.  He was started on Butrans 10 mcg/hour and this does give him good relief.  However, he does not like the idea that he is on a narcotic and that it also makes him tired all the time.  His pain is worse with bending, sitting, also with twisting.  Ice does tend to relieve his pain.  His relief from meds is good.  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  He is up on his feet frequently.  He climbs steps.  He drives.  PAST MEDICAL HISTORY:  Fairly unremarkable.  He takes topical medications for rosacea.  He does take anti-fungal medication.  CURRENT MEDICATIONS: 1. Butrans 10 mcg/hour, changed every weeks. 2. Ceftin 500 mg b.i.d. 3. Doxycycline 1 p.o. daily, questioned dose. 4. Glucosamine chondroitin 1 daily. 5. Naproxen. 6. Omeprazole 1 p.o. b.i.d. 7. Omega-3 fatty acids q.a.m. 8. Lamisil 250 daily. 9. B12 1000 mcg q.24 h. as well.  PAST SURGICAL HISTORY:  Tonsillectomy in 1960, hernia in 2006.  SOCIAL HISTORY:  Single.  Alcohol use varies.  He will either have a drink or a glass of wine at night.  This is one of the reasons he really does want to be on the patch.  His blood pressure 139/93, pulse 50, respiration is regular, O2 sat 99% on room air.  Height 5 feet, 9 inches, weight 216 pounds.  In general, no acute distress.  Mood and affect appropriate.  His upper extremity strength is normal.  Upper extremity range of motion is normal.  Upper extremity sensation is normal.  His neck range of motion is full.  In the lower extremities, straight leg raising test  is negative.  Lower extremity strength is normal.  Lower extremity sensation reduced right L3-L4, L5-S1 dermatomal distribution.  Deep tendon reflexes are normal. Strength is normal with exception of mild right ankle dorsiflexor weakness, although this maybe more of a pain inhibition.  His gait shows no evidence of toe drag or knee instability.  His back range of motion is 75% range forward flexion, extension, lateral rotation and bending.  I reviewed his MRI.  It does show multilevel lumbar degenerative disk, disk osteophyte complex at L2-3, mild facet hypertrophy, some left subarticular lateral stenosis at that level.  L3-4 showed right facet arthropathy with disk osteophyte complex, right inferior foraminal disk protrusion, borderline right foraminal stenosis.  AP diameter 9-mm borderline subarticular lateral stenosis bilaterally.  At L4-5 mild left facet arthropathy and mild-to-moderate left foraminal stenosis and at L5-S1 it looks normal.  Of note is that there was a transitional lumbosacral vertebra at L5.  IMPRESSION: Lumbar spondylosis, degenerative disk with right lower extremity radicular symptoms.  Correlating with the MRI appears that L3-4 is the most likely suspect.  I would start out with a right L3-4 translaminar lumbar epidural steroid injection under fluoroscopic guidance and if this fails would go to the transforaminal route.  We discussed medication management.  We will trial him on some tramadol, but he is to come off the [place].  Overall, he would really like to  avoid medications.  We also discussed the importance of building core strength.  I will give him 10 mg of Valium prior to his injection.  We discussed with the patient, agrees with plan.  I used spine model to help straight technique of the procedure and that went over risks and benefits.     Erick Colace, M.D. Electronically Signed    AEK/MedQ D:05/06/2011 15:44:05  T:05/06/2011  21:34:06  Job #:  [id number]  cc:   Sanda Linger, MD [address] [place] [address] [place] [address]

## 2011-05-09 NOTE — Telephone Encounter (Signed)
appt set

## 2011-05-19 ENCOUNTER — Telehealth: Payer: Self-pay

## 2011-05-19 ENCOUNTER — Encounter (HOSPITAL_BASED_OUTPATIENT_CLINIC_OR_DEPARTMENT_OTHER): Payer: BC Managed Care – PPO | Admitting: Physical Medicine & Rehabilitation

## 2011-05-19 DIAGNOSIS — J019 Acute sinusitis, unspecified: Secondary | ICD-10-CM

## 2011-05-19 DIAGNOSIS — IMO0002 Reserved for concepts with insufficient information to code with codable children: Secondary | ICD-10-CM

## 2011-05-19 MED ORDER — CEFUROXIME AXETIL 500 MG PO TABS: 500.0000 mg | ORAL_TABLET | Freq: Two times a day (BID) | ORAL | Status: AC

## 2011-05-19 NOTE — Telephone Encounter (Signed)
Patient notified//LMOVM advising per MD 

## 2011-05-19 NOTE — Telephone Encounter (Signed)
Refill sent.

## 2011-05-19 NOTE — Procedures (Signed)
[name], Daniel [name]               ACCOUNT NO.:  [id number]  MEDICAL RECORD NO.:  [id number]           PATIENT TYPE:  O  LOCATION:  [facility]                          FACILITY:  [facility]  PHYSICIAN:  Erick Colace, M.D.DATE OF BIRTH:  [birth year]  DATE OF PROCEDURE: DATE OF DISCHARGE:                              OPERATIVE REPORT  PROCEDURE:  Right L3-4 transforaminal lumbar epidural steroid injection under fluoroscopic guidance.  INDICATION:  Right L3 radiculitis with L3-4 disk.  He does have mild-to- moderate right foraminal stenosis as well as lateral recess stenosis at that level.  His pain is only partially response to medication management and other conservative care.  Informed consent was obtained after describing risks and benefits of the procedure with the patient.  These include bleeding, bruising, and infection.  He elects to proceed and has given written consent.  The patient placed prone on fluoroscopy table.  Betadine prep, sterile drape, 25-gauge inch and half needle was used to anesthetize skin and subcu tissue, 1% lidocaine x2 mL.  Then, a 22-gauge 3.5 inch spinal needle was inserted under fluoroscopic guidance at right L3-4 intervertebral foramen.  AP and lateral images utilized.  Omnipaque 180 under live fluoro demonstrated no intravascular uptake.  Then, solution containing 2 mL of 1% MPF lidocaine, 1 mL of 10 mg/mL dexamethasone was injected.  The patient tolerated procedure well.  Postprocedure instructions given.  I will see him back in 1 month.     Erick Colace, M.D. Electronically Signed    AEK/MEDQ  D:  05/19/2011 12:19:20  T:  05/19/2011 20:04:34  Job:  [id number]

## 2011-05-19 NOTE — Telephone Encounter (Signed)
Patient called lmovm stated that he was treated with ceftin for a sinus infection. He is about 80% better but still has a few symptoms. He would like to know if MD feels a partial round of abx is needed. Please advise, if so pt will need a rx sent to his pharmacy.

## 2011-06-17 ENCOUNTER — Ambulatory Visit (HOSPITAL_BASED_OUTPATIENT_CLINIC_OR_DEPARTMENT_OTHER): Payer: BC Managed Care – PPO | Admitting: Physical Medicine & Rehabilitation

## 2011-06-17 ENCOUNTER — Encounter: Payer: BC Managed Care – PPO | Attending: Physical Medicine & Rehabilitation

## 2011-06-17 DIAGNOSIS — M545 Low back pain, unspecified: Secondary | ICD-10-CM

## 2011-06-17 DIAGNOSIS — M549 Dorsalgia, unspecified: Secondary | ICD-10-CM | POA: Insufficient documentation

## 2011-06-17 DIAGNOSIS — G894 Chronic pain syndrome: Secondary | ICD-10-CM

## 2011-06-17 DIAGNOSIS — M47817 Spondylosis without myelopathy or radiculopathy, lumbosacral region: Secondary | ICD-10-CM | POA: Insufficient documentation

## 2011-06-17 DIAGNOSIS — M51379 Other intervertebral disc degeneration, lumbosacral region without mention of lumbar back pain or lower extremity pain: Secondary | ICD-10-CM | POA: Insufficient documentation

## 2011-06-17 DIAGNOSIS — M48061 Spinal stenosis, lumbar region without neurogenic claudication: Secondary | ICD-10-CM | POA: Insufficient documentation

## 2011-06-17 DIAGNOSIS — M5137 Other intervertebral disc degeneration, lumbosacral region: Secondary | ICD-10-CM | POA: Insufficient documentation

## 2011-06-17 DIAGNOSIS — M129 Arthropathy, unspecified: Secondary | ICD-10-CM | POA: Insufficient documentation

## 2011-06-17 DIAGNOSIS — M79609 Pain in unspecified limb: Secondary | ICD-10-CM | POA: Insufficient documentation

## 2011-06-18 NOTE — Assessment & Plan Note (Signed)
This is patient of Dr. Wynn Banker seen for low back pain.  He did a right L3 transforaminal ESI in late December.  The patient states he is doing much better since then.  He rates his average pain as 0-2.  General activity level is 1-2.  He has some pain in the evening.  Sleep patterns are fair. Sitting aggravates.  Medication helps but he is not using any medication currently.  He does climb steps and drive.  Functionally he is employed.  He works about 50 hours a week.  REVIEW OF SYSTEMS:  Notable for difficulties described above but otherwise unremarkable.  PAST MEDICAL HISTORY/SOCIAL HISTORY/FAMILY HISTORY:  Unchanged.  PHYSICAL EXAMINATION:  His blood pressure is 138/82, pulse 61, respirations 14, O2 sats 96 on room air.  Motor strength and sensation are intact.  Constitutionally, he is within normal limits.  He is alert and oriented x3.  He has normal gait.  ASSESSMENT:  Lumbago with some radiculitis.  PLAN:  He will follow up in 2 months.  He states he may call and cancel that if he is doing well.  Otherwise his questions were encouraged and answered.     Daniel [name] L. Blima Dessert Electronically Signed    RLW/MedQ D:  06/17/2011 10:32:46  T:  06/18/2011 01:25:57  Job #:  [id number]

## 2011-08-15 ENCOUNTER — Ambulatory Visit: Payer: BC Managed Care – PPO | Admitting: Physical Medicine & Rehabilitation

## 2013-04-11 ENCOUNTER — Ambulatory Visit (INDEPENDENT_AMBULATORY_CARE_PROVIDER_SITE_OTHER): Payer: BC Managed Care – PPO | Admitting: Internal Medicine

## 2013-04-11 ENCOUNTER — Encounter: Payer: Self-pay | Admitting: Internal Medicine

## 2013-04-11 VITALS — BP 124/82 | HR 94 | Temp 101.7°F | Wt 220.1 lb

## 2013-04-11 DIAGNOSIS — K5732 Diverticulitis of large intestine without perforation or abscess without bleeding: Secondary | ICD-10-CM

## 2013-04-11 DIAGNOSIS — K5792 Diverticulitis of intestine, part unspecified, without perforation or abscess without bleeding: Secondary | ICD-10-CM

## 2013-04-11 MED ORDER — PROMETHAZINE HCL 25 MG PO TABS: 25.0000 mg | ORAL_TABLET | Freq: Four times a day (QID) | ORAL | Status: DC | PRN

## 2013-04-11 MED ORDER — CIPROFLOXACIN HCL 500 MG PO TABS: 500.0000 mg | ORAL_TABLET | Freq: Two times a day (BID) | ORAL | Status: DC

## 2013-04-11 MED ORDER — METRONIDAZOLE 500 MG PO TABS: 500.0000 mg | ORAL_TABLET | Freq: Three times a day (TID) | ORAL | Status: DC

## 2013-04-11 NOTE — Progress Notes (Signed)
Subjective:    Patient ID: Daniel [name], male    DOB: 29-Nov-1950, 62 y.o.   MRN: [id number]  Abdominal Pain This is a new problem. The current episode started in the past 7 days. The onset quality is gradual. The problem occurs 2 to 4 times per day. The problem has been gradually worsening. The pain is located in the LLQ and generalized abdominal region. The pain is moderate. The quality of the pain is colicky, aching and a sensation of fullness. The abdominal pain does not radiate. Associated symptoms include anorexia, diarrhea, a fever (in past 24h), nausea and vomiting. Pertinent negatives include no constipation, dysuria, frequency, headaches, hematochezia, hematuria, melena, myalgias or weight loss. Nothing aggravates the pain. The pain is relieved by nothing. He has tried acetaminophen and antacids for the symptoms. The treatment provided mild relief. Prior diagnostic workup includes lower endoscopy. There is no history of abdominal surgery, gallstones, GERD, irritable bowel syndrome or pancreatitis.  Emesis  This is a new problem. The current episode started yesterday. The problem occurs less than 2 times per day. The emesis has an appearance of bile. The fever has been present for less than 1 day. Associated symptoms include abdominal pain, chills, diarrhea and a fever (in past 24h). Pertinent negatives include no chest pain, coughing, headaches, myalgias, URI or weight loss. Risk factors: no ill contacts, travel or recent med change. He has tried acetaminophen and increased fluids for the symptoms. The treatment provided no relief.    Past Medical History  Diagnosis Date  . Acne   . Low back pain radiating to both legs   . Diverticulosis of colon (without mention of hemorrhage)     colo 09/2010  . DDD (degenerative disc disease), lumbosacral     chronic back pain    Review of Systems  Constitutional: Positive for fever (in past 24h) and chills. Negative for weight loss.  Respiratory:  Negative for cough.   Cardiovascular: Negative for chest pain.  Gastrointestinal: Positive for nausea, vomiting, abdominal pain, diarrhea and anorexia. Negative for constipation, melena and hematochezia.  Genitourinary: Negative for dysuria, frequency and hematuria.  Musculoskeletal: Negative for myalgias.  Neurological: Negative for headaches.       Objective:   Physical Exam BP 124/82  Pulse 94  Temp(Src) 101.7 F (38.7 C) (Oral)  Wt 220 lb 1.9 oz (99.846 kg)  SpO2 95% Wt Readings from Last 3 Encounters:  04/11/13 220 lb 1.9 oz (99.846 kg)  05/05/11 217 lb (98.431 kg)  04/19/11 218 lb 8 oz (99.111 kg)   Constitutional: he appears well-developed and well-nourished. No distress. nontoxic Eyes: Conjunctivae and EOM are normal. Pupils are equal, round, and reactive to light. No scleral icterus.  Neck: Normal range of motion. Neck supple. No JVD present. No thyromegaly present.  Cardiovascular: Normal rate, regular rhythm and normal heart sounds.  No murmur heard. No BLE edema. Pulmonary/Chest: Effort normal and breath sounds normal. No respiratory distress. he has no wheezes.  Abdominal: Soft. Bowel sounds are normal. he exhibits no distension. There is tenderness without rebound/gaurding along LLQ, left side. no masses  Skin: Skin is warm and dry. No rash noted. No erythema.  Psychiatric: he has a normal mood and affect. behavior is normal. Judgment and thought content normal.  Lab Results  Component Value Date   WBC 5.0 09/22/2010   HGB 15.0 09/22/2010   HCT 44.0 09/22/2010   PLT 214.0 09/22/2010   GLUCOSE 95 09/22/2010   CHOL 195 09/22/2010   TRIG 42.0  09/22/2010   HDL 73.50 09/22/2010   LDLCALC 113* 09/22/2010   ALT 20 09/22/2010   AST 20 09/22/2010   NA 141 09/22/2010   K 4.5 09/22/2010   CL 107 09/22/2010   CREATININE 1.0 09/22/2010   BUN 14 09/22/2010   CO2 26 09/22/2010   TSH 1.15 09/22/2010   PSA 1.03 09/22/2010       Assessment & Plan:   Acute diverticulitis with diarrhea, abdominal pain,  fever and nausea -  symptoms ongoing times one week, worse in the last 24 hours  Treat with Flagyl Cipro, promethazine as needed -erx done  Discussed role of labs and potential CT to help guide clarification of diagnosis and treatment, patient declines these at this time due to cost concerns, but agrees to call if symptoms unimproved, sooner if worse  Ok for OTC antidiarrheal as needed

## 2013-04-11 NOTE — Patient Instructions (Addendum)
It was good to see you today.  We have reviewed your prior records including labs and tests today  Cipro and flagyl antibiotics x 1 week - also promethazine as needed for nausea - Your prescription(s) have been submitted to your pharmacy. Please take as directed and contact our office if you believe you are having problem(s) with the medication(s).  Other Medications reviewed and updated, no other changes recommended at this time.  Ok for imodium as needed for diarrhea  Call if symptoms unimproved in next 72h for other testing as discussed - call sooner or go to ER if worse  Diverticulitis A diverticulum is a small pouch or sac on the colon. Diverticulosis is the presence of these diverticula on the colon. Diverticulitis is the irritation (inflammation) or infection of diverticula. CAUSES  The colon and its diverticula contain bacteria. If food particles block the tiny opening to a diverticulum, the bacteria inside can grow and cause an increase in pressure. This leads to infection and inflammation and is called diverticulitis. SYMPTOMS   Abdominal pain and tenderness. Usually, the pain is located on the left side of your abdomen. However, it could be located elsewhere.  Fever.  Bloating.  Feeling sick to your stomach (nausea).  Throwing up (vomiting).  Abnormal stools. DIAGNOSIS  Your caregiver will take a history and perform a physical exam. Since many things can cause abdominal pain, other tests may be necessary. Tests may include:  Blood tests.  Urine tests.  X-ray of the abdomen.  CT scan of the abdomen. Sometimes, surgery is needed to determine if diverticulitis or other conditions are causing your symptoms. TREATMENT  Most of the time, you can be treated without surgery. Treatment includes:  Resting the bowels by only having liquids for a few days. As you improve, you will need to eat a low-fiber diet.  Intravenous (IV) fluids if you are losing body fluids  (dehydrated).  Antibiotic medicines that treat infections may be given.  Pain and nausea medicine, if needed.  Surgery if the inflamed diverticulum has burst. HOME CARE INSTRUCTIONS   Try a clear liquid diet (broth, tea, or water for as long as directed by your caregiver). You may then gradually begin a low-fiber diet as tolerated.  A low-fiber diet is a diet with less than 10 grams of fiber. Choose the foods below to reduce fiber in the diet:  White breads, cereals, rice, and pasta.  Cooked fruits and vegetables or soft fresh fruits and vegetables without the skin.  Ground or well-cooked tender beef, ham, veal, lamb, pork, or poultry.  Eggs and seafood.  After your diverticulitis symptoms have improved, your caregiver may put you on a high-fiber diet. A high-fiber diet includes 14 grams of fiber for every 1000 calories consumed. For a standard 2000 calorie diet, you would need 28 grams of fiber. Follow these diet guidelines to help you increase the fiber in your diet. It is important to slowly increase the amount fiber in your diet to avoid gas, constipation, and bloating.  Choose whole-grain breads, cereals, pasta, and brown rice.  Choose fresh fruits and vegetables with the skin on. Do not overcook vegetables because the more vegetables are cooked, the more fiber is lost.  Choose more nuts, seeds, legumes, dried peas, beans, and lentils.  Look for food products that have greater than 3 grams of fiber per serving on the Nutrition Facts label.  Take all medicine as directed by your caregiver.  If your caregiver has given you a  follow-up appointment, it is very important that you go. Not going could result in lasting (chronic) or permanent injury, pain, and disability. If there is any problem keeping the appointment, call to reschedule. SEEK MEDICAL CARE IF:   Your pain does not improve.  You have a hard time advancing your diet beyond clear liquids.  Your bowel movements do  not return to normal. SEEK IMMEDIATE MEDICAL CARE IF:   Your pain becomes worse.  You have an oral temperature above 102 F (38.9 C), not controlled by medicine.  You have repeated vomiting.  You have bloody or black, tarry stools.  Symptoms that brought you to your caregiver become worse or are not getting better. MAKE SURE YOU:   Understand these instructions.  Will watch your condition.  Will get help right away if you are not doing well or get worse. Document Released: 02/16/2005 Document Revised: 08/01/2011 Document Reviewed: 06/14/2010 [facility] Patient Information 2014 [employer], [place].

## 2013-04-11 NOTE — Progress Notes (Signed)
Pre-visit discussion using our clinic review tool. No additional management support is needed unless otherwise documented below in the visit note.  

## 2013-04-24 ENCOUNTER — Ambulatory Visit (INDEPENDENT_AMBULATORY_CARE_PROVIDER_SITE_OTHER): Payer: BC Managed Care – PPO | Admitting: Internal Medicine

## 2013-04-24 ENCOUNTER — Other Ambulatory Visit (INDEPENDENT_AMBULATORY_CARE_PROVIDER_SITE_OTHER): Payer: BC Managed Care – PPO

## 2013-04-24 ENCOUNTER — Encounter: Payer: Self-pay | Admitting: Internal Medicine

## 2013-04-24 VITALS — BP 120/78 | HR 61 | Temp 97.8°F | Resp 16 | Ht 69.0 in | Wt 214.8 lb

## 2013-04-24 DIAGNOSIS — Z0000 Encounter for general adult medical examination without abnormal findings: Secondary | ICD-10-CM

## 2013-04-24 DIAGNOSIS — R10814 Left lower quadrant abdominal tenderness: Secondary | ICD-10-CM

## 2013-04-24 DIAGNOSIS — Z6831 Body mass index (BMI) 31.0-31.9, adult: Secondary | ICD-10-CM

## 2013-04-24 LAB — CBC WITH DIFFERENTIAL/PLATELET
Basophils Absolute: 0 10*3/uL (ref 0.0–0.1)
Basophils Relative: 0.5 % (ref 0.0–3.0)
Eosinophils Relative: 4.5 % (ref 0.0–5.0)
HCT: 45.6 % (ref 39.0–52.0)
Hemoglobin: 15.4 g/dL (ref 13.0–17.0)
Lymphocytes Relative: 39 % (ref 12.0–46.0)
Monocytes Relative: 6.9 % (ref 3.0–12.0)
Neutro Abs: 3 10*3/uL (ref 1.4–7.7)
RBC: 5.14 Mil/uL (ref 4.22–5.81)
WBC: 6.1 10*3/uL (ref 4.5–10.5)

## 2013-04-24 LAB — COMPREHENSIVE METABOLIC PANEL
BUN: 13 mg/dL (ref 6–23)
CO2: 29 mEq/L (ref 19–32)
Calcium: 9.1 mg/dL (ref 8.4–10.5)
Chloride: 106 mEq/L (ref 96–112)
Creatinine, Ser: 0.8 mg/dL (ref 0.4–1.5)
GFR: 99.01 mL/min (ref >60.00)
Total Bilirubin: 0.7 mg/dL (ref 0.3–1.2)

## 2013-04-24 LAB — URINALYSIS, ROUTINE W REFLEX MICROSCOPIC
RBC / HPF: NONE SEEN (ref 0–?)
Specific Gravity, Urine: 1.02 (ref 1.000–1.030)
Total Protein, Urine: NEGATIVE
Urine Glucose: NEGATIVE
Urobilinogen, UA: 0.2 (ref 0.0–1.0)
WBC, UA: NONE SEEN (ref 0–?)
pH: 6 (ref 5.0–8.0)

## 2013-04-24 LAB — LIPID PANEL
Cholesterol: 191 mg/dL (ref 0–200)
HDL: 55.6 mg/dL (ref >39.00)
LDL Cholesterol: 124 mg/dL — ABNORMAL HIGH (ref 0–99)
VLDL: 11 mg/dL (ref 0.0–40.0)

## 2013-04-24 NOTE — Progress Notes (Signed)
Subjective:    Patient ID: Daniel [name], male    DOB: 1951-03-18, 62 y.o.   MRN: [id number]  Abdominal Pain This is a recurrent problem. The current episode started in the past 7 days. The onset quality is gradual. The problem occurs intermittently. The problem has been unchanged. The pain is located in the LLQ. The pain is at a severity of 1/10. The pain is mild. The quality of the pain is aching. The abdominal pain does not radiate. Associated symptoms include diarrhea. Pertinent negatives include no anorexia, arthralgias, belching, constipation, dysuria, fever, flatus, frequency, headaches, hematochezia, hematuria, melena, myalgias, nausea, vomiting or weight loss. Nothing aggravates the pain. The pain is relieved by nothing. He has tried antibiotics (antibiotics and antidiarrheals) for the symptoms. The treatment provided mild relief. There is no history of abdominal surgery, colon cancer, Crohn's disease, gallstones, GERD, irritable bowel syndrome, pancreatitis, PUD or ulcerative colitis.      Review of Systems  Constitutional: Negative.  Negative for fever, chills, weight loss, diaphoresis, activity change, appetite change, fatigue and unexpected weight change.  HENT: Negative.   Eyes: Negative.   Respiratory: Negative.  Negative for cough, chest tightness, shortness of breath, wheezing and stridor.   Cardiovascular: Negative.  Negative for chest pain, palpitations and leg swelling.  Gastrointestinal: Positive for abdominal pain and diarrhea. Negative for nausea, vomiting, constipation, blood in stool, melena, hematochezia, abdominal distention, anal bleeding, rectal pain, anorexia and flatus.  Endocrine: Negative.   Genitourinary: Negative.  Negative for dysuria, urgency, frequency, hematuria, flank pain, decreased urine volume, discharge, penile swelling, scrotal swelling, enuresis, difficulty urinating, genital sores, penile pain and testicular pain.  Musculoskeletal: Negative.   Negative for arthralgias and myalgias.  Skin: Negative.   Allergic/Immunologic: Negative.   Neurological: Negative.  Negative for headaches.  Hematological: Negative.  Negative for adenopathy. Does not bruise/bleed easily.  Psychiatric/Behavioral: Negative.        Objective:   Physical Exam  Vitals reviewed. Constitutional: He is oriented to person, place, and time. He appears well-developed and well-nourished.  Non-toxic appearance. He does not have a sickly appearance. He does not appear ill. No distress.  HENT:  Head: Normocephalic and atraumatic.  Mouth/Throat: Oropharynx is clear and moist. No oropharyngeal exudate.  Eyes: Conjunctivae are normal. Right eye exhibits no discharge. Left eye exhibits no discharge. No scleral icterus.  Neck: Normal range of motion. Neck supple. No JVD present. No tracheal deviation present. No thyromegaly present.  Cardiovascular: Normal rate, regular rhythm, normal heart sounds and intact distal pulses.  Exam reveals no gallop and no friction rub.   No murmur heard. Pulmonary/Chest: Effort normal and breath sounds normal. No stridor. No respiratory distress. He has no wheezes. He has no rales. He exhibits no tenderness.  Abdominal: Soft. Normal appearance and bowel sounds are normal. He exhibits no shifting dullness, no distension, no pulsatile liver, no fluid wave, no abdominal bruit, no ascites, no pulsatile midline mass and no mass. There is no hepatosplenomegaly, splenomegaly or hepatomegaly. There is tenderness in the left lower quadrant. There is no rebound, no guarding, no CVA tenderness, no tenderness at McBurney's point and negative Murphy's sign. No hernia. Hernia confirmed negative in the ventral area, confirmed negative in the right inguinal area and confirmed negative in the left inguinal area.  Genitourinary: Rectum normal, testes normal and penis normal. Rectal exam shows no external hemorrhoid, no internal hemorrhoid, no fissure, no mass, no  tenderness and anal tone normal. Guaiac negative stool. Prostate is enlarged (1+ smooth symm  BPH). Prostate is not tender. Right testis shows no mass, no swelling and no tenderness. Right testis is descended. Left testis shows no swelling and no tenderness. Left testis is descended. Circumcised. No penile erythema or penile tenderness. No discharge found.  Musculoskeletal: Normal range of motion. He exhibits no edema and no tenderness.  Lymphadenopathy:    He has no cervical adenopathy.       Right: No inguinal adenopathy present.       Left: No inguinal adenopathy present.  Neurological: He is oriented to person, place, and time.  Skin: Skin is warm and dry. No rash noted. He is not diaphoretic. No erythema. No pallor.  Psychiatric: He has a normal mood and affect. His behavior is normal. Judgment and thought content normal.     Lab Results  Component Value Date   WBC 5.0 09/22/2010   HGB 15.0 09/22/2010   HCT 44.0 09/22/2010   PLT 214.0 09/22/2010   GLUCOSE 95 09/22/2010   CHOL 195 09/22/2010   TRIG 42.0 09/22/2010   HDL 73.50 09/22/2010   LDLCALC 113* 09/22/2010   ALT 20 09/22/2010   AST 20 09/22/2010   NA 141 09/22/2010   K 4.5 09/22/2010   CL 107 09/22/2010   CREATININE 1.0 09/22/2010   BUN 14 09/22/2010   CO2 26 09/22/2010   TSH 1.15 09/22/2010   PSA 1.03 09/22/2010       Assessment & Plan:

## 2013-04-24 NOTE — Assessment & Plan Note (Signed)
He will work on his lifestyle modifications to lose weight 

## 2013-04-24 NOTE — Progress Notes (Signed)
Pre visit review using our clinic review tool, if applicable. No additional management support is needed unless otherwise documented below in the visit note. 

## 2013-04-24 NOTE — Patient Instructions (Addendum)
Health Maintenance, Males A healthy lifestyle and preventative care can promote health and wellness.  Maintain regular health, dental, and eye exams.  Eat a healthy diet. Foods like vegetables, fruits, whole grains, low-fat dairy products, and lean protein foods contain the nutrients you need without too many calories. Decrease your intake of foods high in solid fats, added sugars, and salt. Get information about a proper diet from your caregiver, if necessary.  Regular physical exercise is one of the most important things you can do for your health. Most adults should get at least 150 minutes of moderate-intensity exercise (any activity that increases your heart rate and causes you to sweat) each week. In addition, most adults need muscle-strengthening exercises on 2 or more days a week.   Maintain a healthy weight. The body mass index (BMI) is a screening tool to identify possible weight problems. It provides an estimate of body fat based on height and weight. Your caregiver can help determine your BMI, and can help you achieve or maintain a healthy weight. For adults 20 years and older:  A BMI below 18.5 is considered underweight.  A BMI of 18.5 to 24.9 is normal.  A BMI of 25 to 29.9 is considered overweight.  A BMI of 30 and above is considered obese.  Maintain normal blood lipids and cholesterol by exercising and minimizing your intake of saturated fat. Eat a balanced diet with plenty of fruits and vegetables. Blood tests for lipids and cholesterol should begin at age 20 and be repeated every 5 years. If your lipid or cholesterol levels are high, you are over 50, or you are a high risk for heart disease, you may need your cholesterol levels checked more frequently.Ongoing high lipid and cholesterol levels should be treated with medicines, if diet and exercise are not effective.  If you smoke, find out from your caregiver how to quit. If you do not use tobacco, do not start.  Lung  cancer screening is recommended for adults aged 55 80 years who are at high risk for developing lung cancer because of a history of smoking. Yearly low-dose computed tomography (CT) is recommended for people who have at least a 30-pack-year history of smoking and are a current smoker or have quit within the past 15 years. A pack year of smoking is smoking an average of 1 pack of cigarettes a day for 1 year (for example: 1 pack a day for 30 years or 2 packs a day for 15 years). Yearly screening should continue until the smoker has stopped smoking for at least 15 years. Yearly screening should also be stopped for people who develop a health problem that would prevent them from having lung cancer treatment.  If you choose to drink alcohol, do not exceed 2 drinks per day. One drink is considered to be 12 ounces (355 mL) of beer, 5 ounces (148 mL) of wine, or 1.5 ounces (44 mL) of liquor.  Avoid use of street drugs. Do not share needles with anyone. Ask for help if you need support or instructions about stopping the use of drugs.  High blood pressure causes heart disease and increases the risk of stroke. Blood pressure should be checked at least every 1 to 2 years. Ongoing high blood pressure should be treated with medicines if weight loss and exercise are not effective.  If you are 45 to 60 years old, ask your caregiver if you should take aspirin to prevent heart disease.  Diabetes screening involves taking a blood   sample to check your fasting blood sugar level. This should be done once every 3 years, after age 76, if you are within normal weight and without risk factors for diabetes. Testing should be considered at a younger age or be carried out more frequently if you are overweight and have at least 1 risk factor for diabetes.  Colorectal cancer can be detected and often prevented. Most routine colorectal cancer screening begins at the age of 75 and continues through age 33. However, your caregiver may  recommend screening at an earlier age if you have risk factors for colon cancer. On a yearly basis, your caregiver may provide home test kits to check for hidden blood in the stool. Use of a small camera at the end of a tube, to directly examine the colon (sigmoidoscopy or colonoscopy), can detect the earliest forms of colorectal cancer. Talk to your caregiver about this at age 81, when routine screening begins. Direct examination of the colon should be repeated every 5 to 10 years through age 17, unless early forms of pre-cancerous polyps or small growths are found.  Hepatitis C blood testing is recommended for all people born from [birth year] through 1963 and any individual with known risks for hepatitis C.  Healthy men should no longer receive prostate-specific antigen (PSA) blood tests as part of routine cancer screening. Consult with your caregiver about prostate cancer screening.  Testicular cancer screening is not recommended for adolescents or adult males who have no symptoms. Screening includes self-exam, caregiver exam, and other screening tests. Consult with your caregiver about any symptoms you have or any concerns you have about testicular cancer.  Practice safe sex. Use condoms and avoid high-risk sexual practices to reduce the spread of sexually transmitted infections (STIs).  Use sunscreen. Apply sunscreen liberally and repeatedly throughout the day. You should seek shade when your shadow is shorter than you. Protect yourself by wearing long sleeves, pants, a wide-brimmed hat, and sunglasses year round, whenever you are outdoors.  Notify your caregiver of new moles or changes in moles, especially if there is a change in shape or color. Also notify your caregiver if a mole is larger than the size of a pencil eraser.  A one-time screening for abdominal aortic aneurysm (AAA) and surgical repair of large AAAs by sound wave imaging (ultrasonography) is recommended for ages 62 to 69 years who are  current or former smokers.  Stay current with your immunizations. Document Released: 11/05/2007 Document Revised: 09/03/2012 Document Reviewed: 10/04/2010 [facility] Patient Information 2014 [place], [place]. Abdominal Pain Abdominal pain can be caused by many things. Your caregiver decides the seriousness of your pain by an examination and possibly blood tests and X-rays. Many cases can be observed and treated at home. Most abdominal pain is not caused by a disease and will probably improve without treatment. However, in many cases, more time must pass before a clear cause of the pain can be found. Before that point, it may not be known if you need more testing, or if hospitalization or surgery is needed. HOME CARE INSTRUCTIONS   Do not take laxatives unless directed by your caregiver.  Take pain medicine only as directed by your caregiver.  Only take over-the-counter or prescription medicines for pain, discomfort, or fever as directed by your caregiver.  Try a clear liquid diet (broth, tea, or water) for as long as directed by your caregiver. Slowly move to a bland diet as tolerated. SEEK IMMEDIATE MEDICAL CARE IF:   The pain does not  go away.  You have a fever.  You keep throwing up (vomiting).  The pain is felt only in portions of the abdomen. Pain in the right side could possibly be appendicitis. In an adult, pain in the left lower portion of the abdomen could be colitis or diverticulitis.  You pass bloody or black tarry stools. MAKE SURE YOU:   Understand these instructions.  Will watch your condition.  Will get help right away if you are not doing well or get worse. Document Released: 02/16/2005 Document Revised: 08/01/2011 Document Reviewed: 12/26/2007 [facility] Patient Information 2014 [place], [place].

## 2013-04-24 NOTE — Assessment & Plan Note (Signed)
He has a history of diverticulitis and was treated a week ago with antibiotics He does not feel like he is much better I will check his labs today to look for secondary causes of pain and I have asked him to get a CT scan done to see if there is a kidney stone,       abscess, ileus, SBO, etc.

## 2013-04-24 NOTE — Assessment & Plan Note (Signed)
Exam done Vaccines were updated Labs ordered Pt ed material was given 

## 2013-04-25 ENCOUNTER — Encounter: Payer: Self-pay | Admitting: Internal Medicine

## 2013-05-02 ENCOUNTER — Other Ambulatory Visit: Payer: BC Managed Care – PPO

## 2014-01-02 ENCOUNTER — Telehealth: Payer: Self-pay | Admitting: Internal Medicine

## 2014-01-02 NOTE — Telephone Encounter (Signed)
I don't know anything about a head injury

## 2014-01-02 NOTE — Telephone Encounter (Signed)
Pt called request referral to neurologist due to the head injury that he had, offer an appt but he want to see what Dr. Cordelia Poche say first.

## 2014-01-06 ENCOUNTER — Encounter: Payer: Self-pay | Admitting: Internal Medicine

## 2014-01-06 ENCOUNTER — Ambulatory Visit (INDEPENDENT_AMBULATORY_CARE_PROVIDER_SITE_OTHER): Payer: BC Managed Care – PPO | Admitting: Internal Medicine

## 2014-01-06 ENCOUNTER — Ambulatory Visit (INDEPENDENT_AMBULATORY_CARE_PROVIDER_SITE_OTHER)
Admission: RE | Admit: 2014-01-06 | Discharge: 2014-01-06 | Disposition: A | Discharge status: Home / Self Care | Payer: BC Managed Care – PPO | Source: Ambulatory Visit | Attending: Internal Medicine | Admitting: Internal Medicine

## 2014-01-06 VITALS — BP 140/90 | HR 69 | Temp 98.2°F | Resp 16 | Ht 69.0 in | Wt 211.4 lb

## 2014-01-06 DIAGNOSIS — S0990XA Unspecified injury of head, initial encounter: Secondary | ICD-10-CM | POA: Insufficient documentation

## 2014-01-06 NOTE — Progress Notes (Signed)
Pre visit review using our clinic review tool, if applicable. No additional management support is needed unless otherwise documented below in the visit note. 

## 2014-01-06 NOTE — Patient Instructions (Signed)

## 2014-01-06 NOTE — Assessment & Plan Note (Signed)
It sounds like a minor head injury but he has persistent symptoms He wants to see a neurologist - referral done I will get a CT scan done today to look for SDH, ICH, fracture, etc

## 2014-01-06 NOTE — Progress Notes (Signed)
Subjective:    Patient ID: Daniel [name], male    DOB: March 11, 1951, 63 y.o.   MRN: [id number]  Head Injury  Incident onset: 6 weeks ago. Injury mechanism: he was cutting the grass and walked into a tree branch, hit the left side of his scalp. He lost consciousness for a period of less than one minute. The volume of blood lost was minimal. The quality of the pain is described as aching. The pain is at a severity of 2/10. The pain is mild. The pain has been intermittent since the injury. Associated symptoms include headaches. Pertinent negatives include no blurred vision, disorientation, memory loss, numbness, tinnitus, vomiting or weakness. He has tried prescription drugs (he was seen at an [facility] 1 week ago and was given meclizine but it has not helped) for the symptoms. The treatment provided no relief.      Review of Systems  Constitutional: Negative.  Negative for fever, chills, diaphoresis, appetite change and fatigue.  HENT: Negative for tinnitus.   Eyes: Negative.  Negative for blurred vision.  Respiratory: Negative.   Cardiovascular: Negative.   Gastrointestinal: Positive for nausea. Negative for vomiting, abdominal pain and diarrhea.  Endocrine: Negative.   Genitourinary: Negative.   Musculoskeletal: Negative.  Negative for arthralgias, back pain, gait problem, joint swelling, myalgias, neck pain and neck stiffness.  Skin: Negative.   Allergic/Immunologic: Negative.   Neurological: Positive for dizziness and headaches. Negative for seizures, syncope, facial asymmetry, speech difficulty, weakness, light-headedness and numbness.  Hematological: Negative.  Negative for adenopathy. Does not bruise/bleed easily.  Psychiatric/Behavioral: Negative.  Negative for memory loss.       Objective:   Physical Exam  Vitals reviewed. Constitutional: He is oriented to person, place, and time. He appears well-developed and well-nourished. No distress.  HENT:  Head: Normocephalic and atraumatic.    Mouth/Throat: Oropharynx is clear and moist. No oropharyngeal exudate.  Eyes: Conjunctivae and EOM are normal. Pupils are equal, round, and reactive to light. Right eye exhibits no discharge. Left eye exhibits no discharge. No scleral icterus.  Neck: Normal range of motion. Neck supple. No JVD present. No tracheal deviation present. No thyromegaly present.  Cardiovascular: Normal rate, regular rhythm, normal heart sounds and intact distal pulses.  Exam reveals no gallop and no friction rub.   No murmur heard. Pulmonary/Chest: Effort normal and breath sounds normal. No stridor. No respiratory distress. He has no wheezes. He has no rales. He exhibits no tenderness.  Abdominal: Soft. Bowel sounds are normal. He exhibits no distension and no mass. There is no tenderness. There is no rebound and no guarding.  Musculoskeletal: Normal range of motion. He exhibits no edema and no tenderness.  Lymphadenopathy:    He has no cervical adenopathy.  Neurological: He is alert and oriented to person, place, and time. He has normal reflexes. He displays normal reflexes. No cranial nerve deficit. He exhibits normal muscle tone. Coordination normal.  Skin: Skin is warm and dry. No rash noted. He is not diaphoretic. No erythema. No pallor.  Psychiatric: He has a normal mood and affect. His behavior is normal. Judgment and thought content normal.    Lab Results  Component Value Date   WBC 6.1 04/24/2013   HGB 15.4 04/24/2013   HCT 45.6 04/24/2013   PLT 277.0 04/24/2013   GLUCOSE 95 04/24/2013   CHOL 191 04/24/2013   TRIG 55.0 04/24/2013   HDL 55.60 04/24/2013   LDLCALC 124* 04/24/2013   ALT 31 04/24/2013   AST 26 04/24/2013  NA 140 04/24/2013   K 5.1 04/24/2013   CL 106 04/24/2013   CREATININE 0.8 04/24/2013   BUN 13 04/24/2013   CO2 29 04/24/2013   TSH 1.02 04/24/2013   PSA 1.12 04/24/2013        Assessment & Plan:

## 2014-01-07 ENCOUNTER — Other Ambulatory Visit: Payer: BC Managed Care – PPO

## 2014-02-14 ENCOUNTER — Ambulatory Visit (INDEPENDENT_AMBULATORY_CARE_PROVIDER_SITE_OTHER): Payer: BC Managed Care – PPO | Admitting: Neurology

## 2014-02-14 ENCOUNTER — Encounter: Payer: Self-pay | Admitting: Neurology

## 2014-02-14 VITALS — BP 130/74 | HR 68 | Resp 16 | Ht 69.0 in | Wt 212.0 lb

## 2014-02-14 DIAGNOSIS — G4440 Drug-induced headache, not elsewhere classified, not intractable: Secondary | ICD-10-CM

## 2014-02-14 DIAGNOSIS — F0781 Postconcussional syndrome: Secondary | ICD-10-CM

## 2014-02-14 MED ORDER — AMITRIPTYLINE HCL 10 MG PO TABS: 10.0000 mg | ORAL_TABLET | Freq: Every day | ORAL | Status: DC

## 2014-02-14 NOTE — Progress Notes (Signed)
NEUROLOGY CONSULTATION NOTE  Daniel [name] MRN: [id number] DOB: 11-27-1950  Referring provider: Dr. Ronnald Ramp Primary care provider: Dr. Ronnald Ramp  Reason for consult:  Head injury  HISTORY OF PRESENT ILLNESS: Daniel [name] is a 63 year old right-handed man with no significant past medical history who presents for head injury.  Limited records and CT reviewed  In July, he was cutting the grass and hit the left side of his scalp on a tree branch.  He walked about another 40 feet before he realized something was wrong.  He noted blood running down his face.  He didn't quite feel right.  The next day, he still didn't feel well and noted neck pain, so he went to urgent care, where an Xray of the cervical spine was reported unremarkable.  He really doesn't remember those first two days very well.  He developed headaches, described as bi-temporal shooting pain, as well as soreness on the back of his neck with a numb feeling on his occiput.  He also has had difficulty focusing.  He has difficulty processing information and cannot follow conversations.  He also has been experiencing insomnia.  He has difficulty falling asleep.  When he finally falls asleep, he sleeps for 9 or 10 hours. He saw his PCP, Dr. Ronnald Ramp.  CT of the head without contrast was performed on 01/06/14, which was unremarkable with no evidence of hemorrhage or contusions.  For the headaches, he was initially taking Aleve daily up until mid August.  He was advised to stop due to potential liver toxicity and he has since been taking Tylenol twice a day.  The Tylenol doesn't work as well as the [place].  He also went to a chiropractor for a month, which has helped with the neck and head pain.  He has noticed improvement in cognition and people have told him he seems more lucid.  He still has symptoms and is not back to baseline, however.  He still has been able to go to work.  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  For insomnia, he takes melatonin 2.5mg , which  makes him very tired during the day.  He feels fatigued but not depressed.  PAST MEDICAL HISTORY: Past Medical History  Diagnosis Date  . Acne   . Low back pain radiating to both legs   . Diverticulosis of colon (without mention of hemorrhage)     colo 09/2010  . DDD (degenerative disc disease), lumbosacral     chronic back pain    PAST SURGICAL HISTORY: Past Surgical History  Procedure Laterality Date  . Hernia repair    . Tonsillectomy    . Refractive surgery      left eye/ with enhancement    MEDICATIONS: Current Outpatient Prescriptions on File Prior to Visit  Medication Sig Dispense Refill  . DOXYCYCLINE, ROSACEA, PO Take by mouth as needed.       . Multiple Vitamins-Minerals (MULTIVITAMIN PO) Take by mouth.      . Omega-3 Fatty Acids (FISH OIL) 1200 MG CAPS Take 1 capsule by mouth every morning.        . vitamin B-12 (CYANOCOBALAMIN) 1000 MCG tablet Take 1,000 mcg by mouth daily.         No current facility-administered medications on file prior to visit.    ALLERGIES: No Known Allergies  FAMILY HISTORY: Family History  Problem Relation Age of Onset  . Diabetes Mother   . Hypertension Mother   . Arthritis Mother   . Arthritis Father   .  Heart disease Father   . Cancer Neg Hx   . Stroke Neg Hx   . Kidney disease Neg Hx   . Early death Neg Hx   . Diabetes Sister     SOCIAL HISTORY: History   Social History  . Marital Status: Single    Spouse Name: N/A    Number of Children: N/A  . Years of Education: N/A   Occupational History  . Not on file.   Social History Main Topics  . Smoking status: Never Smoker   . Smokeless tobacco: Never Used  . Alcohol Use: 8.4 oz/week    7 Glasses of wine, 7 Shots of liquor per week  . Drug Use: No  . Sexual Activity: No   Other Topics Concern  . Not on file   Social History Narrative   Caffienated beverages-Yes   Seat belts often-Yes   Smoke alarm in home-Yes   Firearms/guns in home-Yes   History of  physical abuse-NO   HLE- Masters degree    REVIEW OF SYSTEMS: Constitutional: Fatigue.  No fevers, chills, or sweats, change in appetite Eyes: No visual changes, double vision, eye pain Ear, nose and throat: No hearing loss, ear pain, nasal congestion, sore throat Cardiovascular: No chest pain, palpitations Respiratory:  No shortness of breath at rest or with exertion, wheezes GastrointestinaI: No nausea, vomiting, diarrhea, abdominal pain, fecal incontinence Genitourinary:  No dysuria, urinary retention or frequency Musculoskeletal:  Neck pain, no back pain Integumentary: No rash, pruritus, skin lesions Neurological: as above Psychiatric: Insomnia.  No depression, anxiety Endocrine: No palpitations, fatigue, diaphoresis, mood swings, change in appetite, change in weight, increased thirst Hematologic/Lymphatic:  No anemia, purpura, petechiae. Allergic/Immunologic: no itchy/runny eyes, nasal congestion, recent allergic reactions, rashes  PHYSICAL EXAM: Filed Vitals:   02/14/14 0914  BP: 130/74  Pulse: 68  Resp: 16   General: No acute distress Head:  Normocephalic/atraumatic Neck: supple, no paraspinal tenderness, full range of motion Back: No paraspinal tenderness Heart: regular rate and rhythm Lungs: Clear to auscultation bilaterally. Vascular: No carotid bruits. Neurological Exam: Mental status: alert and oriented to person, place, and time, delayed recall poor, remote memory intact, fund of knowledge intact, attention intact, concentration somewhat impaired, speech fluent and not dysarthric, language intact. Montreal Cognitive Assessment  02/14/2014  Visuospatial/ Executive (0/5) 5  Naming (0/3) 3  Attention: Read list of digits (0/2) 2  Attention: Read list of letters (0/1) 1  Attention: Serial 7 subtraction starting at 100 (0/3) 3  Language: Repeat phrase (0/2) 1  Language : Fluency (0/1) 0  Abstraction (0/2) 2  Delayed Recall (0/5) 1  Orientation (0/6) 6  Total 24    Adjusted Score (based on education) 24   Cranial nerves: CN I: not tested CN II: pupils equal, round and reactive to light, visual fields intact, fundi unremarkable, without vessel changes, exudates, hemorrhages or papilledema. CN III, IV, VI:  full range of motion, no nystagmus, no ptosis CN V: endorses mildly reduced sensation in right V1 distribution. CN VII: upper and lower face symmetric CN VIII: hearing intact CN IX, X: gag intact, uvula midline CN XI: sternocleidomastoid and trapezius muscles intact CN XII: tongue midline Bulk & Tone: normal, no fasciculations. Motor: 5/5 throughout Sensation: temperature and vibration intact Deep Tendon Reflexes: 2+ throughout, except 3+ in the knees bilaterally, toes downgoin Finger to nose testing: some bilateral intention tremor but no dysmetria Heel to shin: no dysmetria Gait: normal station and stride.  Able to turn.  Some difficulty with  tandem walking. Romberg with mild sway.  IMPRESSION: Postconcussion syndrome.  Symptoms will take time to resolve.  The fact that he has noted improvement is a good sign. Medication-overuse headache  He also endorsed mild reduced sensation on the right side of his forehead.  This is such a mild finding, and in the absence of other focal abnormalities, I don't think further imaging with an MRI is warranted at this time.  PLAN: 1.  Will start amitriptyline 10mg  at bedtime to help address headache, insomnia and cognitive problems.  He is to call in 4 weeks with update. 2.  Advised to stop Tylenol all together.  May use Aleve but no more than 2 days out of the week to prevent rebound headache 3.  Follow up in 3 months for reassessment.  Thank you for allowing me to take part in the care of this patient.  Metta Clines, DO  CC:  Scarlette Calico, MD

## 2014-02-14 NOTE — Patient Instructions (Signed)
Your symptoms are related to the concussion.  Sometimes it may take awhile for the symptoms to resolve.  Since you have noticed some improvement already, that is a good sign.    1.  Start amitriptyline 10mg  at bedtime.  This is an antidepressant that is effective for post-concussion symptoms such as headache, insomnia, and difficulty focusing.  Side effects may include sleepiness, dizziness or dry mouth.  Call in 4 weeks and we can adjust dose if needed. 2.  Stop taking the Tylenol.  Instead, you may take the Aleve again, but you cannot take it or any other pain reliever more than 2 days out of the week to prevent rebound headache. 3.  Follow up in 3 months.

## 2014-03-17 ENCOUNTER — Other Ambulatory Visit: Payer: Self-pay | Admitting: *Deleted

## 2014-03-17 ENCOUNTER — Telehealth: Payer: Self-pay | Admitting: *Deleted

## 2014-03-17 ENCOUNTER — Telehealth: Payer: Self-pay | Admitting: Neurology

## 2014-03-17 DIAGNOSIS — G43009 Migraine without aura, not intractable, without status migrainosus: Secondary | ICD-10-CM

## 2014-03-17 MED ORDER — AMITRIPTYLINE HCL 10 MG PO TABS: 10.0000 mg | ORAL_TABLET | Freq: Every day | ORAL | Status: DC

## 2014-03-17 NOTE — Telephone Encounter (Signed)
Pt calling with 30 day update on how he is doing. Confirmed phone number # [phone] / Sherri S.

## 2014-03-17 NOTE — Telephone Encounter (Signed)
Patient called stating the Elavil was working great he has only had maybe 2 headaches . I will call 90 day supply into pharmacy

## 2014-03-17 NOTE — Telephone Encounter (Signed)
Pt calling with 30 day update on how he is doing. Confirmed phone number # [phone] / Sherri S.

## 2014-03-17 NOTE — Telephone Encounter (Signed)
Update on new medication it is working  refills sent to pharmacy

## 2014-05-05 ENCOUNTER — Encounter: Payer: Self-pay | Admitting: Neurology

## 2014-05-05 ENCOUNTER — Ambulatory Visit (INDEPENDENT_AMBULATORY_CARE_PROVIDER_SITE_OTHER): Payer: BC Managed Care – PPO | Admitting: Neurology

## 2014-05-05 VITALS — BP 130/70 | HR 66 | Temp 98.0°F | Resp 16 | Ht 69.0 in | Wt 204.4 lb

## 2014-05-05 DIAGNOSIS — G44219 Episodic tension-type headache, not intractable: Secondary | ICD-10-CM

## 2014-05-05 DIAGNOSIS — M542 Cervicalgia: Secondary | ICD-10-CM

## 2014-05-05 MED ORDER — TIZANIDINE HCL 2 MG PO CAPS: 2.0000 mg | ORAL_CAPSULE | Freq: Three times a day (TID) | ORAL | Status: DC | PRN

## 2014-05-05 NOTE — Patient Instructions (Signed)
1.  Continue amitriptyline 10mg  at bedtime 2.  When you start to feel the tension in the neck, take tizanidine 2mg .  It is a muscle relaxant and hopefully will treat the neck tension before it progresses to headache.  Caution as it may cause drowsiness. 3.  Follow up in 6 months or as needed.

## 2014-05-05 NOTE — Progress Notes (Signed)
NEUROLOGY FOLLOW UP OFFICE NOTE  Daniel [name] [id number]  HISTORY OF PRESENT ILLNESS: Daniel [name] is a 63 year old right-handed man with no significant past medical history who follows up for postconcussion syndrome and medication overuse headache.  UPDATE: He was started on amitriptyline 10mg  and was advised to limit use of Aleve to no more than 2 days out of the week.  He hasn't been taking any pain relievers, however.  Headaches have significantly improved.  He gets them once a week but they last all day because he hasn't taken anything.  The headaches are preceded by neck tension which is related to stress.  The amitriptyline has helped with his sleep and he no longer requires other sleep aids.  His focus and concentration improved.  HISTORY: In July, he was cutting the grass and hit the left side of his scalp on a tree branch.  He walked about another 40 feet before he realized something was wrong.  He noted blood running down his face.  He didn't quite feel right.  The next day, he still didn't feel well and noted neck pain, so he went to urgent care, where an Xray of the cervical spine was reported unremarkable.  He really doesn't remember those first two days very well.  He developed headaches, described as bi-temporal shooting pain, as well as soreness on the back of his neck with a numb feeling on his occiput.  He also has had difficulty focusing.  He has difficulty processing information and cannot follow conversations.  He also has been experiencing insomnia.  He has difficulty falling asleep.  When he finally falls asleep, he sleeps for 9 or 10 hours. He saw his PCP, Dr. Ronnald Ramp.  CT of the head without contrast was performed on 01/06/14, which was unremarkable with no evidence of hemorrhage or contusions.  For the headaches, he was initially taking Aleve daily up until mid August.  He was advised to stop due to potential liver toxicity and he has since been taking Tylenol twice a day.   The Tylenol doesn't work as well as the [place].  He also went to a chiropractor for a month, which has helped with the neck and head pain.  He has noticed improvement in cognition and people have told him he seems more lucid.  He still has symptoms and is not back to baseline, however.  He still has been able to go to work.  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  For insomnia, he takes melatonin 2.5mg , which makes him very tired during the day.  He feels fatigued but not depressed.  PAST MEDICAL HISTORY: Past Medical History  Diagnosis Date  . Acne   . Low back pain radiating to both legs   . Diverticulosis of colon (without mention of hemorrhage)     colo 09/2010  . DDD (degenerative disc disease), lumbosacral     chronic back pain    MEDICATIONS: Current Outpatient Prescriptions on File Prior to Visit  Medication Sig Dispense Refill  . amitriptyline (ELAVIL) 10 MG tablet Take 1 tablet (10 mg total) by mouth at bedtime. 30 tablet 2  . DOXYCYCLINE, ROSACEA, PO Take by mouth as needed.     . Multiple Vitamins-Minerals (MULTIVITAMIN PO) Take by mouth.    . Omega-3 Fatty Acids (FISH OIL) 1200 MG CAPS Take 1 capsule by mouth every morning.      . vitamin B-12 (CYANOCOBALAMIN) 1000 MCG tablet Take 1,000 mcg by mouth daily.      Marland Kitchen  Melatonin 5 MG CAPS Take 5 mg by mouth.     No current facility-administered medications on file prior to visit.    ALLERGIES: No Known Allergies  FAMILY HISTORY: Family History  Problem Relation Age of Onset  . Diabetes Mother   . Hypertension Mother   . Arthritis Mother   . Arthritis Father   . Heart disease Father   . Cancer Neg Hx   . Stroke Neg Hx   . Kidney disease Neg Hx   . Early death Neg Hx   . Diabetes Sister     SOCIAL HISTORY: History   Social History  . Marital Status: Single    Spouse Name: N/A    Number of Children: N/A  . Years of Education: N/A   Occupational History  . Not on file.   Social History Main Topics  . Smoking status:  Never Smoker   . Smokeless tobacco: Never Used  . Alcohol Use: 8.4 oz/week    7 Glasses of wine, 7 Shots of liquor per week  . Drug Use: No  . Sexual Activity: No   Other Topics Concern  . Not on file   Social History Narrative   Caffienated beverages-Yes   Seat belts often-Yes   Smoke alarm in home-Yes   Firearms/guns in home-Yes   History of physical abuse-NO   HLE- Masters degree    REVIEW OF SYSTEMS: Constitutional: No fevers, chills, or sweats, no generalized fatigue, change in appetite Eyes: No visual changes, double vision, eye pain Ear, nose and throat: No hearing loss, ear pain, nasal congestion, sore throat Cardiovascular: No chest pain, palpitations Respiratory:  No shortness of breath at rest or with exertion, wheezes GastrointestinaI: No nausea, vomiting, diarrhea, abdominal pain, fecal incontinence Genitourinary:  No dysuria, urinary retention or frequency Musculoskeletal:  No neck pain, back pain Integumentary: No rash, pruritus, skin lesions Neurological: as above Psychiatric: No depression, insomnia, anxiety Endocrine: No palpitations, fatigue, diaphoresis, mood swings, change in appetite, change in weight, increased thirst Hematologic/Lymphatic:  No anemia, purpura, petechiae. Allergic/Immunologic: no itchy/runny eyes, nasal congestion, recent allergic reactions, rashes  PHYSICAL EXAM: Filed Vitals:   05/05/14 1115  BP: 130/70  Pulse: 66  Temp: 98 F (36.7 C)  Resp: 16   General: No acute distress Head:  Normocephalic/atraumatic Eyes:  Fundoscopic exam unremarkable without vessel changes, exudates, hemorrhages or papilledema. Neck: supple, no paraspinal tenderness, full range of motion Heart:  Regular rate and rhythm Lungs:  Clear to auscultation bilaterally Back: No paraspinal tenderness Neurological Exam: alert and oriented to person, place, and time. Attention span and concentration intact, recent and remote memory intact, fund of knowledge  intact.  Speech fluent and not dysarthric, language intact.  CN II-XII intact. Fundoscopic exam unremarkable without vessel changes, exudates, hemorrhages or papilledema.  Bulk and tone normal, muscle strength 5/5 throughout.  Sensation to light touch intact.  Deep tendon reflexes 2+ throughout.  Finger to nose testing intact.  Gait normal, Romberg negative.  IMPRESSION: Tension-type headaches, improved Neck pain  PLAN: 1.  Continue amitriptyline 10mg  at bedtime. 2.  Will prescribe tizanidine 2mg  to take when he feels neck tension, in hopes to prevent from progressing to headache.  May take OTC pain reliever but limited to no more than 2 days out of the week. 3.  Follow up in 6 months.  15 minutes spent with patient, over 50% spent discussing prognosis and management.  Metta Clines, DO  CC:  Scarlette Calico, MD

## 2014-05-19 ENCOUNTER — Ambulatory Visit: Payer: BC Managed Care – PPO | Admitting: Neurology

## 2014-06-10 ENCOUNTER — Other Ambulatory Visit: Payer: Self-pay | Admitting: Neurology

## 2014-09-09 ENCOUNTER — Other Ambulatory Visit: Payer: Self-pay | Admitting: Neurology

## 2014-11-04 ENCOUNTER — Ambulatory Visit (INDEPENDENT_AMBULATORY_CARE_PROVIDER_SITE_OTHER): Payer: BLUE CROSS/BLUE SHIELD | Admitting: Neurology

## 2014-11-04 ENCOUNTER — Encounter: Payer: Self-pay | Admitting: Neurology

## 2014-11-04 VITALS — BP 126/70 | HR 68 | Resp 16 | Ht 69.0 in | Wt 203.9 lb

## 2014-11-04 DIAGNOSIS — M542 Cervicalgia: Secondary | ICD-10-CM | POA: Diagnosis not present

## 2014-11-04 DIAGNOSIS — G44219 Episodic tension-type headache, not intractable: Secondary | ICD-10-CM

## 2014-11-04 DIAGNOSIS — M25522 Pain in left elbow: Secondary | ICD-10-CM

## 2014-11-04 MED ORDER — TIZANIDINE HCL 2 MG PO CAPS: 2.0000 mg | ORAL_CAPSULE | Freq: Three times a day (TID) | ORAL | Status: DC | PRN

## 2014-11-04 NOTE — Patient Instructions (Signed)
1.  Continue amitriptyline 10mg  at bedtime 2.  Take tizanidine as needed for neck pain 3.  Will refer to [name] (Sports Medicine) for neck pain and left elbow pain 4.  Follow up in 6 months.  Call sooner with any worsening symptoms

## 2014-11-04 NOTE — Progress Notes (Signed)
NEUROLOGY FOLLOW UP OFFICE NOTE  Daniel [name] [id number]  HISTORY OF PRESENT ILLNESS: Daniel [name] is a 64 year old right-handed man with no significant past medical history who follows up for tension type headache.  UPDATE: He takes amitriptyline 10mg  daily.  He was prescribed tizanidine 2mg  for neck tension.  He has noted improvement in headache.  He will get a headache once or twice a week and easily manageable with Tylenol.  He still has neck tension and tingling going into the left trapezius muscle.  He also notes pain and tenderness in the left elbow.  He can't pick up a whole lot of weight with the left arm.  He denies numbness or weakness in the left arm.  HISTORY: In July, he was cutting the grass and hit the left side of his scalp on a tree branch.  He walked about another 40 feet before he realized something was wrong.  He noted blood running down his face.  He didn't quite feel right.  The next day, he still didn't feel well and noted neck pain, so he went to urgent care, where an Xray of the cervical spine was reported unremarkable.  He really doesn't remember those first two days very well.  He developed headaches, described as bi-temporal shooting pain, as well as soreness on the back of his neck with a numb feeling on his occiput.  He also has had difficulty focusing.  He has difficulty processing information and cannot follow conversations.  He also has been experiencing insomnia.  He has difficulty falling asleep.  When he finally falls asleep, he sleeps for 9 or 10 hours. He saw his PCP, Dr. Ronnald Ramp.  CT of the head without contrast was performed on 01/06/14, which was unremarkable with no evidence of hemorrhage or contusions.  For the headaches, he was initially taking Aleve daily up until mid August.  He was advised to stop due to potential liver toxicity and he has since been taking Tylenol twice a day.  The Tylenol doesn't work as well as the [place].  He also went to a  chiropractor for a month, which has helped with the neck and head pain.  He has noticed improvement in cognition and people have told him he seems more lucid.  He still has symptoms and is not back to baseline, however.  He still has been able to go to work.  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  For insomnia, he takes melatonin 2.5mg , which makes him very tired during the day.  He feels fatigued but not depressed.  PAST MEDICAL HISTORY: Past Medical History  Diagnosis Date  . Acne   . Low back pain radiating to both legs   . Diverticulosis of colon (without mention of hemorrhage)     colo 09/2010  . DDD (degenerative disc disease), lumbosacral     chronic back pain    MEDICATIONS: Current Outpatient Prescriptions on File Prior to Visit  Medication Sig Dispense Refill  . amitriptyline (ELAVIL) 10 MG tablet TAKE 1 TABLET BY MOUTH AT BEDTIME 30 tablet 2  . doxycycline (VIBRAMYCIN) 50 MG capsule   6  . DOXYCYCLINE, ROSACEA, PO Take by mouth as needed.     . Melatonin 5 MG CAPS Take 5 mg by mouth.    . Multiple Vitamins-Minerals (MULTIVITAMIN PO) Take by mouth.    . Omega-3 Fatty Acids (FISH OIL) 1200 MG CAPS Take 1 capsule by mouth every morning.      . vitamin B-12 (CYANOCOBALAMIN)  1000 MCG tablet Take 1,000 mcg by mouth daily.       No current facility-administered medications on file prior to visit.    ALLERGIES: No Known Allergies  FAMILY HISTORY: Family History  Problem Relation Age of Onset  . Diabetes Mother   . Hypertension Mother   . Arthritis Mother   . Arthritis Father   . Heart disease Father   . Cancer Neg Hx   . Stroke Neg Hx   . Kidney disease Neg Hx   . Early death Neg Hx   . Diabetes Sister     SOCIAL HISTORY: History   Social History  . Marital Status: Single    Spouse Name: N/A  . Number of Children: N/A  . Years of Education: N/A   Occupational History  . Not on file.   Social History Main Topics  . Smoking status: Never Smoker   . Smokeless tobacco:  Never Used  . Alcohol Use: 4.2 oz/week    7 Shots of liquor per week  . Drug Use: No  . Sexual Activity: No   Other Topics Concern  . Not on file   Social History Narrative   Caffienated beverages-Yes   Seat belts often-Yes   Smoke alarm in home-Yes   Firearms/guns in home-Yes   History of physical abuse-NO   HLE- Masters degree    REVIEW OF SYSTEMS: Constitutional: No fevers, chills, or sweats, no generalized fatigue, change in appetite Eyes: No visual changes, double vision, eye pain Ear, nose and throat: No hearing loss, ear pain, nasal congestion, sore throat Cardiovascular: No chest pain, palpitations Respiratory:  No shortness of breath at rest or with exertion, wheezes GastrointestinaI: No nausea, vomiting, diarrhea, abdominal pain, fecal incontinence Genitourinary:  No dysuria, urinary retention or frequency Musculoskeletal:  No neck pain, back pain Integumentary: No rash, pruritus, skin lesions Neurological: as above Psychiatric: No depression, insomnia, anxiety Endocrine: No palpitations, fatigue, diaphoresis, mood swings, change in appetite, change in weight, increased thirst Hematologic/Lymphatic:  No anemia, purpura, petechiae. Allergic/Immunologic: no itchy/runny eyes, nasal congestion, recent allergic reactions, rashes  PHYSICAL EXAM: Filed Vitals:   11/04/14 0907  BP: 126/70  Pulse: 68  Resp: 16   General: No acute distress Head:  Normocephalic/atraumatic Eyes:  Fundoscopic exam unremarkable without vessel changes, exudates, hemorrhages or papilledema. Neck: supple, no paraspinal tenderness, full range of motion.  Tenderness to palpation of the left trapezius as well as in the left shoulder. Heart:  Regular rate and rhythm Lungs:  Clear to auscultation bilaterally Back: No paraspinal tenderness Neurological Exam: alert and oriented to person, place, and time. Attention span and concentration intact, recent and remote memory intact, fund of knowledge  intact.  Speech fluent and not dysarthric, language intact.  CN II-XII intact. Fundoscopic exam unremarkable without vessel changes, exudates, hemorrhages or papilledema.  Bulk and tone normal, muscle strength 4+/5 in left triceps.  4+/5 in left deltoid related to give-way weakness from pain.  Otherwise  5/5 throughout.  Sensation to light touch, temperature and vibration intact.  Deep tendon reflexes 2+ throughout, toes downgoing.  Finger to nose and heel to shin testing intact.  Gait normal, Romberg negative.  IMPRESSION: Tension-type headaches, improved Neck pain Left elbow pain.  Not sure if it could be arthritis versus tendonitis He does exhibit some weakness in the left triceps. I don't think it is related to the pain, because he wasn't in that much discomfort.  It could be coming from the neck, but he has pain involving  the elbow and shoulder joint as well.  PLAN: 1.  Will refer to Dr. Tamala Julian of Sports Medicine regarding neck and elbow pain.  He would like to hold off on MRI of cervical spine at this time until after seeing Dr. Tamala Julian 2.  Continue amitriptyline 10mg  at bedtime and tizanidine 2mg  as needed 3.  Follow up in 6 months but he is to call sooner with worsening pain or symptoms  15 minutes spent face to face with patient, over 50% spent discussing symptoms, possible diagnoses and management.  Metta Clines, DO  CC: Scarlette Calico, MD

## 2014-11-17 ENCOUNTER — Encounter: Payer: Self-pay | Admitting: Family Medicine

## 2014-11-17 ENCOUNTER — Ambulatory Visit (INDEPENDENT_AMBULATORY_CARE_PROVIDER_SITE_OTHER): Payer: BLUE CROSS/BLUE SHIELD | Admitting: Family Medicine

## 2014-11-17 ENCOUNTER — Other Ambulatory Visit (INDEPENDENT_AMBULATORY_CARE_PROVIDER_SITE_OTHER): Payer: BLUE CROSS/BLUE SHIELD

## 2014-11-17 VITALS — BP 110/80 | HR 61 | Ht 69.0 in | Wt 205.0 lb

## 2014-11-17 DIAGNOSIS — M9903 Segmental and somatic dysfunction of lumbar region: Secondary | ICD-10-CM | POA: Diagnosis not present

## 2014-11-17 DIAGNOSIS — M9904 Segmental and somatic dysfunction of sacral region: Secondary | ICD-10-CM

## 2014-11-17 DIAGNOSIS — M25522 Pain in left elbow: Secondary | ICD-10-CM

## 2014-11-17 DIAGNOSIS — M9901 Segmental and somatic dysfunction of cervical region: Secondary | ICD-10-CM

## 2014-11-17 DIAGNOSIS — M999 Biomechanical lesion, unspecified: Secondary | ICD-10-CM | POA: Insufficient documentation

## 2014-11-17 DIAGNOSIS — G8929 Other chronic pain: Secondary | ICD-10-CM

## 2014-11-17 DIAGNOSIS — M9908 Segmental and somatic dysfunction of rib cage: Secondary | ICD-10-CM

## 2014-11-17 DIAGNOSIS — M542 Cervicalgia: Secondary | ICD-10-CM | POA: Diagnosis not present

## 2014-11-17 NOTE — Assessment & Plan Note (Signed)
She does notice some of the triggers [place] pain is from stress. We discussed icing regimen and home exercises. We discussed postural control exercises that I think will also be beneficial. Patient will continue with the amitriptyline that is open with the headaches. Hopes that this will also help his neck. Patient did respond very well to osteopathic manipulation today. Patient will come back and see me again in 3 weeks for further evaluation and treatment.

## 2014-11-17 NOTE — Assessment & Plan Note (Signed)
Patient does have a lateral epicondylitis. Patient given home exercises, icing protocol and will be put in a brace for short course. Patient will avoid extension and we discussed the proper ways to be lifting. Patient given some topical anti-inflammatory will try some over-the-counter natural supplementation. Patient come back and see me again in 3-4 weeks for further evaluation and treatment.

## 2014-11-17 NOTE — Patient Instructions (Addendum)
Good to see you.  Ice 20 minutes 2 times daily. Usually after activity and before bed. Exercises 3 times a week.  Alternate the back and the elbow exercises pennsaid pinkie amount topically 2 times daily as needed.  Avoid wrist extension and lift with thumbs up.  On wall with heels butt shoulder and head touching for goal of 5 minutes daily.  Turmeric 500mg  twice daily See me again in 3 weeks.   Standing:  Secure a rubber exercise band/tubing so that it is at the height of your shoulders when you are either standing or sitting on a firm arm-less chair.  Grasp an end of the band/tubing in each hand and have your palms face each other. Straighten your elbows and lift your hands straight in front of you at shoulder height. Step back away from the secured end of band/tubing until it becomes tense.  Squeeze your shoulder blades together. Keeping your elbows locked and your hands at shoulder-height, bring your hands out to your side.  Hold __________ seconds. Slowly ease the tension on the band/tubing as you reverse the directions and return to the starting position. Repeat __________ times. Complete this exercise __________ times per day. STRENGTH - Scapular Retractors  Secure a rubber exercise band/tubing so that it is at the height of your shoulders when you are either standing or sitting on a firm arm-less chair.  With a palm-down grip, grasp an end of the band/tubing in each hand. Straighten your elbows and lift your hands straight in front of you at shoulder height. Step back away from the secured end of band/tubing until it becomes tense.  Squeezing your shoulder blades together, draw your elbows back as you bend them. Keep your upper arm lifted away from your body throughout the exercise.  Hold __________ seconds. Slowly ease the tension on the band/tubing as you reverse the directions and return to the starting position. Repeat __________ times. Complete this exercise __________ times  per day. STRENGTH - Shoulder Extensors   Secure a rubber exercise band/tubing so that it is at the height of your shoulders when you are either standing or sitting on a firm arm-less chair.  With a thumbs-up grip, grasp an end of the band/tubing in each hand. Straighten your elbows and lift your hands straight in front of you at shoulder height. Step back away from the secured end of band/tubing until it becomes tense.  Squeezing your shoulder blades together, pull your hands down to the sides of your thighs. Do not allow your hands to go behind you.  Hold for __________ seconds. Slowly ease the tension on the band/tubing as you reverse the directions and return to the starting position. Repeat __________ times. Complete this exercise __________ times per day.  STRENGTH - Scapular Retractors and External Rotators  Secure a rubber exercise band/tubing so that it is at the height of your shoulders when you are either standing or sitting on a firm arm-less chair.  With a palm-down grip, grasp an end of the band/tubing in each hand. Bend your elbows 90 degrees and lift your elbows to shoulder height at your sides. Step back away from the secured end of band/tubing until it becomes tense.  Squeezing your shoulder blades together, rotate your shoulder so that your upper arm and elbow remain stationary, but your fists travel upward to head-height.  Hold __________ for seconds. Slowly ease the tension on the band/tubing as you reverse the directions and return to the starting position. Repeat __________ times.  Complete this exercise __________ times per day.  STRENGTH - Scapular Retractors and External Rotators, Rowing  Secure a rubber exercise band/tubing so that it is at the height of your shoulders when you are either standing or sitting on a firm arm-less chair.  With a palm-down grip, grasp an end of the band/tubing in each hand. Straighten your elbows and lift your hands straight in front of  you at shoulder height. Step back away from the secured end of band/tubing until it becomes tense.  Step 1: Squeeze your shoulder blades together. Bending your elbows, draw your hands to your chest as if you are rowing a boat. At the end of this motion, your hands and elbow should be at shoulder-height and your elbows should be out to your sides.  Step 2: Rotate your shoulder to raise your hands above your head. Your forearms should be vertical and your upper-arms should be horizontal.  Hold for __________ seconds. Slowly ease the tension on the band/tubing as you reverse the directions and return to the starting position. Repeat __________ times. Complete this exercise __________ times per day.  STRENGTH - Scapular Retractors and Elevators  Secure a rubber exercise band/tubing so that it is at the height of your shoulders when you are either standing or sitting on a firm arm-less chair.  With a thumbs-up grip, grasp an end of the band/tubing in each hand. Step back away from the secured end of band/tubing until it becomes tense.  Squeezing your shoulder blades together, straighten your elbows and lift your hands straight over your head.  Hold for __________ seconds. Slowly ease the tension on the band/tubing as you reverse the directions and return to the starting position. Repeat __________ times. Complete this exercise __________ times per day.  Document Released: 05/09/2005 Document Revised: 08/01/2011 Document Reviewed: 08/21/2008 [facility] Patient Information 2015 [place], [place]. This information is not intended to replace advice given to you by your health care provider. Make sure you discuss any questions you have with your health care provider.

## 2014-11-17 NOTE — Assessment & Plan Note (Signed)
Decision today to treat with OMT was based on Physical Exam  After verbal consent patient was treated with HVLA, ME techniques in cervical, thoracic and rib areas  Patient tolerated the procedure well with improvement in symptoms  Patient given exercises, stretches and lifestyle modifications  See medications in patient instructions if given  Patient will follow up in 2-3 weeks

## 2014-11-17 NOTE — Progress Notes (Signed)
Pre visit review using our clinic review tool, if applicable. No additional management support is needed unless otherwise documented below in the visit note. 

## 2014-11-17 NOTE — Progress Notes (Signed)
[name] [address], [place] [address] Phone: [phone] Subjective:    I'm seeing this patient by the request  of:    CC: Headaches, neck pain and left elbow pain  [id number] Daniel [name] is a 64 y.o. male coming in with complaint of headaches and neck pain. Patient has been seen by neurology for this. Patient is taking amitriptyline 10 mg daily as well as has a muscle relaxer for breakthrough pain. Patient states that he has noticed improvement since seen neurology. Patient states he still can have some neck tension and in some that seems to be radiating towards the left side of his trapezius muscle. Patient states that this is more of a dull throbbing aching sensation. No radiation down the arm though. Patient still can do daily activities. Patient is noticing he is improving slowly with the headaches but states that this tingling in the trapezius muscle is not significantly better.  He is also complaining of left elbow pain. Patient has had this left elbow pain for approximate 4 months. Discuss it as a dull throbbing aching sensation that he still hurts only with activity and now seems to be all the time. Patient denies any significant weakness but if he tries to lift something above his head he has severe pain. Patient states that he is started using his right arm for more and more activities. 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  Past Medical History  Diagnosis Date  . Acne   . Low back pain radiating to both legs   . Diverticulosis of colon (without mention of hemorrhage)     colo 09/2010  . DDD (degenerative disc disease), lumbosacral     chronic back pain   Past Surgical History  Procedure Laterality Date  . Hernia repair    . Tonsillectomy    . Refractive surgery      left eye/ with enhancement   History  Substance Use Topics   . Smoking status: Never Smoker   . Smokeless tobacco: Never Used  . Alcohol Use: 4.2 oz/week    7 Shots of liquor per week   No Known Allergies Family History  Problem Relation Age of Onset  . Diabetes Mother   . Hypertension Mother   . Arthritis Mother   . Arthritis Father   . Heart disease Father   . Cancer Neg Hx   . Stroke Neg Hx   . Kidney disease Neg Hx   . Early death Neg Hx   . Diabetes Sister        Past medical history, social, surgical and family history all reviewed in electronic medical record.   Review of Systems: No headache, visual changes, nausea, vomiting, diarrhea, constipation, dizziness, abdominal pain, skin rash, fevers, chills, night sweats, weight loss, swollen lymph nodes, body aches, joint swelling, muscle aches, chest pain, shortness of breath, mood changes.   Objective Blood pressure 110/80, pulse 61, height 5\' 9"  (1.753 m), weight 205 lb (92.987 kg), SpO2 98 %.  General: No apparent distress alert and oriented x3 mood and affect normal, dressed appropriately.  HEENT: Pupils equal, extraocular movements intact  Respiratory: Patient's speak in full sentences and does not appear short of breath  Cardiovascular: No lower extremity edema, non tender, no erythema  Skin: Warm dry intact with no signs of infection or rash on extremities or on  axial skeleton.  Abdomen: Soft nontender  Neuro: Cranial nerves II through XII are intact, neurovascularly intact in all extremities with 2+ DTRs and 2+ pulses.  Lymph: No lymphadenopathy of posterior or anterior cervical chain or axillae bilaterally.  Gait normal with good balance and coordination.  MSK:  Non tender with full range of motion and good stability and symmetric strength and tone of shoulders,  wrist, hip, knee and ankles bilaterally.  Neck: Inspection unremarkable. No palpable stepoffs. Negative Spurling's maneuver. Full neck range of motion Grip strength and sensation normal in bilateral  hands Strength good C4 to T1 distribution No sensory change to C4 to T1 Negative Hoffman sign bilaterally Reflexes normal  Elbow: Left Unremarkable to inspection. Range of motion full pronation, supination, flexion, extension. Strength is full to all of the above directions Stable to varus, valgus stress. Negative moving valgus stress test. Really tender over the lateral epicondylar region Ulnar nerve does not sublux. Negative cubital tunnel Tinel's. Contralateral elbow unremarkable  Musculoskeletal ultrasound was performed and interpreted by Charlann Boxer D.O.   Elbow:  Lateral epicondyle and common extensor tendon origin visualized small tear in hypoechoic changes and increasing Doppler flow but no true retraction or avulsion fracture noted Radial head unremarkable and located in annular ligament Medial epicondyle and common flexor tendon origin visualized.  No edema, effusions, or avulsions seen. Ulnar nerve in cubital tunnel unremarkable. Olecranon and triceps insertion visualized and unremarkable without edema, effusion, or avulsion.  No signs olecranon bursitis. Power doppler signal normal.  IMPRESSION:  Lateral epicondylitis with small tear of the extensor common tendon   Osteopathic findings  Cervical C4 flexed rotated and side bent right Thoracic T2 extended rotated and side bent left with inhaled second rib   Procedure note 97110; 15 minutes spent for Therapeutic exercises as stated in above notes.  This included exercises focusing on stretching, strengthening, with significant focus on eccentric aspects.  Patient given scapular stabilization techniques, patient also given proper lifting techniques, pronation and supination exercises as well as eccentric loading of the extensor common tendon. Proper technique shown and discussed handout in great detail with ATC.  All questions were discussed and answered.     Impression and Recommendations:     This case required  medical decision making of moderate complexity.

## 2014-12-08 ENCOUNTER — Ambulatory Visit (INDEPENDENT_AMBULATORY_CARE_PROVIDER_SITE_OTHER): Payer: BLUE CROSS/BLUE SHIELD | Admitting: Family Medicine

## 2014-12-08 ENCOUNTER — Encounter: Payer: Self-pay | Admitting: Family Medicine

## 2014-12-08 VITALS — BP 118/72 | HR 56 | Wt 207.0 lb

## 2014-12-08 DIAGNOSIS — M9901 Segmental and somatic dysfunction of cervical region: Secondary | ICD-10-CM

## 2014-12-08 DIAGNOSIS — M9904 Segmental and somatic dysfunction of sacral region: Secondary | ICD-10-CM | POA: Diagnosis not present

## 2014-12-08 DIAGNOSIS — M25522 Pain in left elbow: Secondary | ICD-10-CM

## 2014-12-08 DIAGNOSIS — G8929 Other chronic pain: Secondary | ICD-10-CM

## 2014-12-08 DIAGNOSIS — M999 Biomechanical lesion, unspecified: Secondary | ICD-10-CM

## 2014-12-08 DIAGNOSIS — M542 Cervicalgia: Secondary | ICD-10-CM | POA: Diagnosis not present

## 2014-12-08 DIAGNOSIS — M9903 Segmental and somatic dysfunction of lumbar region: Secondary | ICD-10-CM | POA: Diagnosis not present

## 2014-12-08 NOTE — Assessment & Plan Note (Signed)
Decision today to treat with OMT was based on Physical Exam  After verbal consent patient was treated with HVLA, ME techniques in cervical, thoracic and rib areas  Patient tolerated the procedure well with improvement in symptoms  Patient given exercises, stretches and lifestyle modifications  See medications in patient instructions if given  Patient will follow up in 4 weeks

## 2014-12-08 NOTE — Patient Instructions (Signed)
Good to see you You are doing amazing Conitnue the wrist brace for another week during the day and then only at night for 2 weeks.  Continue the vitamins See me again in 4 weeks to make sure you are all better

## 2014-12-08 NOTE — Progress Notes (Signed)
[name] [name], [name] [address] Phone: [phone] Subjective:    CC: Headaches, neck pain and left elbow pain  [id number] Daniel [name] is a 64 y.o. male coming in with complaint of headaches and neck pain. Patient has been seen by neurology for this. Patient is taking amitriptyline 10 mg daily as well as has a muscle relaxer for breakthrough pain. Patient states that he has noticed improvement since seen neurology. There is question that some of this was more of a cervicogenic headaches. Patient did have osteopathic manipulation and was told different postural changes and home exercises. Patient states he isn't feeling much better. States that the headaches are significantly more manageable. Nothing that is stopping him from any type of activities at this time. States that the manipulation did seem to be very beneficial.  He is also complaining of left elbow pain. Patient was seen and did have a lateral epicondylar inflammation as well as a tear of the common extensor tendon.  This was given a wrist brace for stabilization as well as home exercises and icing protocol. Patient states he is approximately 60-70% better. Denies any numbness or any new symptoms. States that the strength is coming back slowly as well. 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  Past Medical History  Diagnosis Date  . Acne   . Low back pain radiating to both legs   . Diverticulosis of colon (without mention of hemorrhage)     colo 09/2010  . DDD (degenerative disc disease), lumbosacral     chronic back pain   Past Surgical History  Procedure Laterality Date  . Hernia repair    . Tonsillectomy    . Refractive surgery      left eye/ with enhancement   History  Substance Use Topics  . Smoking status: Never Smoker   . Smokeless tobacco: Never Used  . Alcohol Use: 4.2 oz/week    7 Shots of  liquor per week   No Known Allergies Family History  Problem Relation Age of Onset  . Diabetes Mother   . Hypertension Mother   . Arthritis Mother   . Arthritis Father   . Heart disease Father   . Cancer Neg Hx   . Stroke Neg Hx   . Kidney disease Neg Hx   . Early death Neg Hx   . Diabetes Sister        Past medical history, social, surgical and family history all reviewed in electronic medical record.   Review of Systems: No headache, visual changes, nausea, vomiting, diarrhea, constipation, dizziness, abdominal pain, skin rash, fevers, chills, night sweats, weight loss, swollen lymph nodes, body aches, joint swelling, muscle aches, chest pain, shortness of breath, mood changes.   Objective There were no vitals taken for this visit.  General: No apparent distress alert and oriented x3 mood and affect normal, dressed appropriately.  HEENT: Pupils equal, extraocular movements intact  Respiratory: Patient's speak in full sentences and does not appear short of breath  Cardiovascular: No lower extremity edema, non tender, no erythema  Skin: Warm dry intact with no signs of infection or rash on extremities or on axial skeleton.  Abdomen: Soft nontender  Neuro: Cranial nerves II through XII are intact, neurovascularly intact in all extremities with 2+ DTRs and 2+ pulses.  Lymph: No lymphadenopathy of posterior or anterior cervical chain or axillae bilaterally.  Gait  normal with good balance and coordination.  MSK:  Non tender with full range of motion and good stability and symmetric strength and tone of shoulders,  wrist, hip, knee and ankles bilaterally.  Neck: Inspection unremarkable. No palpable stepoffs. Negative Spurling's maneuver. Full neck range of motion Grip strength and sensation normal in bilateral hands Strength good C4 to T1 distribution No sensory change to C4 to T1 Negative Hoffman sign bilaterally Reflexes normal  Elbow: Left Unremarkable to  inspection. Range of motion full pronation, supination, flexion, extension. Strength is full to all of the above directions Stable to varus, valgus stress. Negative moving valgus stress test. Really tender over the lateral epicondylar region Ulnar nerve does not sublux. Negative cubital tunnel Tinel's. Contralateral elbow unremarkable  Musculoskeletal ultrasound was performed and interpreted by Charlann Boxer D.O.   Elbow:  Lateral epicondyle and common extensor tendon origin visualized  with scar tissue formation noted. Patient does have a callus formation noted over the lateral epicondylar region. This was not seen previously but does show interval healing. Radial head unremarkable and located in annular ligament Medial epicondyle and common flexor tendon origin visualized.  No edema, effusions, or avulsions seen. Ulnar nerve in cubital tunnel unremarkable. Olecranon and triceps insertion visualized and unremarkable without edema, effusion, or avulsion.  No signs olecranon bursitis. Power doppler signal normal.  IMPRESSION:  Lateral epicondylitis with healing small avulsion fracture and improved tear   Osteopathic findings  Cervical C4 flexed rotated and side bent right Thoracic T2 extended rotated and side bent left with inhaled second rib T4 extended rotated and side bent right     Impression and Recommendations:     This case required medical decision making of moderate complexity.

## 2014-12-08 NOTE — Assessment & Plan Note (Signed)
Improved at this time. We will make no significant changes at this time. Patient will continue the home exercises and will start wearing the brace less. Patient was given an exercise prescription. Patient will come back and see me again in 4 weeks for further evaluation.

## 2014-12-08 NOTE — Assessment & Plan Note (Signed)
Patient is doing significantly better right now. Patient is responding well to osteopathic manipulation and we'll continue the home exercises. Patient continue the over-the-counter natural supplementations. We discussed again about postural control the can be beneficial. Patient will come back in 4 weeks for further evaluation and treatment.

## 2014-12-14 ENCOUNTER — Other Ambulatory Visit: Payer: Self-pay | Admitting: Neurology

## 2014-12-29 NOTE — Addendum Note (Signed)
Addended by: Douglass Rivers T on: 12/29/2014 09:25 AM   Modules accepted: Orders

## 2015-01-05 ENCOUNTER — Encounter: Payer: Self-pay | Admitting: Family Medicine

## 2015-01-05 ENCOUNTER — Ambulatory Visit (INDEPENDENT_AMBULATORY_CARE_PROVIDER_SITE_OTHER): Payer: BLUE CROSS/BLUE SHIELD | Admitting: Family Medicine

## 2015-01-05 VITALS — BP 116/84 | HR 67 | Ht 69.0 in | Wt 205.0 lb

## 2015-01-05 DIAGNOSIS — G8929 Other chronic pain: Secondary | ICD-10-CM

## 2015-01-05 DIAGNOSIS — M9904 Segmental and somatic dysfunction of sacral region: Secondary | ICD-10-CM | POA: Diagnosis not present

## 2015-01-05 DIAGNOSIS — M542 Cervicalgia: Secondary | ICD-10-CM | POA: Diagnosis not present

## 2015-01-05 DIAGNOSIS — M9901 Segmental and somatic dysfunction of cervical region: Secondary | ICD-10-CM | POA: Diagnosis not present

## 2015-01-05 DIAGNOSIS — M999 Biomechanical lesion, unspecified: Secondary | ICD-10-CM

## 2015-01-05 DIAGNOSIS — M9903 Segmental and somatic dysfunction of lumbar region: Secondary | ICD-10-CM

## 2015-01-05 MED ORDER — HYDROXYZINE HCL 10 MG PO TABS: 10.0000 mg | ORAL_TABLET | Freq: Three times a day (TID) | ORAL | Status: DC | PRN

## 2015-01-05 NOTE — Progress Notes (Signed)
[name] [address], [place] [address] Phone: [phone] Subjective:    CC: Headaches, neck pain and left elbow pain follow up  [id number] Daniel [name] is a 64 y.o. male coming in with complaint of headaches and neck pain. Patient has been seen by neurology for this. Patient is taking amitriptyline 10 mg daily as well as has a muscle relaxer for breakthrough pain. Patient saw me and we did work on some postural changes to try to help with this as well. Patient was given ergonomic changes and home exercises. We discussed over-the-counter natural supplementations as well. Patient states he has had significant stress recently. 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 Patient has had difficulty with this and has been very anxious. Not sleeping well and states that this is giving him more neck pain as well as headaches.  Patient was also seen previously and had a lateral epicondylitis. Patient had been responding very well to conservative therapy. Patient was 70% better at last exam and continued with the conservative therapy. Patient states he was doing much better but unfortunately because he been packing boxes some mild increase in pain. Also not doing the exercises regularly.  Past Medical History  Diagnosis Date  . Acne   . Low back pain radiating to both legs   . Diverticulosis of colon (without mention of hemorrhage)     colo 09/2010  . DDD (degenerative disc disease), lumbosacral     chronic back pain   Past Surgical History  Procedure Laterality Date  . Hernia repair    . Tonsillectomy    . Refractive surgery      left eye/ with enhancement   Social History  Substance Use Topics  . Smoking status: Never Smoker   . Smokeless tobacco: Never Used  . Alcohol Use: 4.2 oz/week    7 Shots of liquor per week   No Known Allergies Family History  Problem Relation Age of Onset  . Diabetes Mother   .  Hypertension Mother   . Arthritis Mother   . Arthritis Father   . Heart disease Father   . Cancer Neg Hx   . Stroke Neg Hx   . Kidney disease Neg Hx   . Early death Neg Hx   . Diabetes Sister        Past medical history, social, surgical and family history all reviewed in electronic medical record.   Review of Systems: No headache, visual changes, nausea, vomiting, diarrhea, constipation, dizziness, abdominal pain, skin rash, fevers, chills, night sweats, weight loss, swollen lymph nodes, body aches, joint swelling, muscle aches, chest pain, shortness of breath, mood changes.   Objective Blood pressure 116/84, pulse 67, height 5\' 9"  (1.753 m), weight 205 lb (92.987 kg), SpO2 96 %.  General: No apparent distress alert and oriented x3 mood and affect normal, dressed appropriately.  HEENT: Pupils equal, extraocular movements intact  Respiratory: Patient's speak in full sentences and does not appear short of breath  Cardiovascular: No lower extremity edema, non tender, no erythema  Skin: Warm dry intact with no signs of infection or rash on extremities or on axial skeleton.  Abdomen: Soft nontender  Neuro: Cranial nerves II through XII are intact, neurovascularly intact in all extremities with 2+ DTRs and 2+ pulses.  Lymph: No lymphadenopathy of posterior or anterior cervical chain or axillae bilaterally.  Gait normal with good balance and coordination.  MSK:  Non tender with  full range of motion and good stability and symmetric strength and tone of shoulders,  wrist, hip, knee and ankles bilaterally.  Neck: Inspection unremarkable. No palpable stepoffs. Negative Spurling's maneuver. Full neck range of motion patient does have more muscle tightness and previous exam Grip strength and sensation normal in bilateral hands Strength good C4 to T1 distribution No sensory change to C4 to T1 Negative Hoffman sign bilaterally Reflexes normal  Elbow: Left Unremarkable to inspection. Range  of motion full pronation, supination, flexion, extension. Strength is full to all of the above directions Stable to varus, valgus stress. Negative moving valgus stress test. Really tender over the lateral epicondylar region Ulnar nerve does not sublux. Negative cubital tunnel Tinel's. Contralateral elbow unremarkable  Osteopathic findings Cervical C4 flexed rotated and side bent right Thoracic T2 extended rotated and side bent left with inhaled second rib T4 extended rotated and side bent right  much tighter paraspinal musculature tightness from previous exam.    Impression and Recommendations:     This case required medical decision making of moderate complexity.

## 2015-01-05 NOTE — Assessment & Plan Note (Signed)
Decision today to treat with OMT was based on Physical Exam  After verbal consent patient was treated with HVLA, ME techniques in cervical, thoracic and rib areas  Patient tolerated the procedure well with improvement in symptoms  Patient given exercises, stretches and lifestyle modifications  See medications in patient instructions if given  Patient will follow up in 2-4 weeks

## 2015-01-05 NOTE — Assessment & Plan Note (Signed)
Patient is having more stress overall. I think patient is actually this is causing more of the pain, psychosomatic. Patient will continue with some the exercises but patient given another medication to help with some anxiety. We discussed icing regimen. Patient has been out of that normal routine which I think will be beneficial when he starts. Patient will come back and see me again in 2-3 weeks for further evaluation and treatment. Did respond fairly well to osteopathic manipulation.

## 2015-01-05 NOTE — Patient Instructions (Signed)
Good to see you Prilosec over the counter Hydroxyzine up to 3 times daily A little tighter today and try to remember to take care of your self See me again in 3-4 weeks.

## 2015-01-05 NOTE — Progress Notes (Signed)
Pre visit review using our clinic review tool, if applicable. No additional management support is needed unless otherwise documented below in the visit note. 

## 2015-02-10 ENCOUNTER — Encounter: Payer: Self-pay | Admitting: Family Medicine

## 2015-02-10 ENCOUNTER — Ambulatory Visit (INDEPENDENT_AMBULATORY_CARE_PROVIDER_SITE_OTHER): Payer: BLUE CROSS/BLUE SHIELD | Admitting: Family Medicine

## 2015-02-10 VITALS — BP 118/82 | HR 66 | Ht 69.0 in | Wt 209.0 lb

## 2015-02-10 DIAGNOSIS — M9901 Segmental and somatic dysfunction of cervical region: Secondary | ICD-10-CM

## 2015-02-10 DIAGNOSIS — M542 Cervicalgia: Secondary | ICD-10-CM

## 2015-02-10 DIAGNOSIS — G8929 Other chronic pain: Secondary | ICD-10-CM | POA: Diagnosis not present

## 2015-02-10 DIAGNOSIS — M999 Biomechanical lesion, unspecified: Secondary | ICD-10-CM

## 2015-02-10 NOTE — Patient Instructions (Addendum)
Good to see you You are doing great. Don't change a thing.  Continue all the medicines.  See me again in 6 weeks.

## 2015-02-10 NOTE — Assessment & Plan Note (Signed)
Decision today to treat with OMT was based on Physical Exam  After verbal consent patient was treated with HVLA, ME techniques in cervical, thoracic and rib areas  Patient tolerated the procedure well with improvement in symptoms  Patient given exercises, stretches and lifestyle modifications  See medications in patient instructions if given  Patient will follow up in 6 weeks

## 2015-02-10 NOTE — Progress Notes (Signed)
Pre visit review using our clinic review tool, if applicable. No additional management support is needed unless otherwise documented below in the visit note. 

## 2015-02-10 NOTE — Assessment & Plan Note (Signed)
Patient is doing much better at this time. Encourage him to continue the ergonomics as well as the home exercises fairly regularly. Patient will continue the medication includes a muscle relaxer at night as well as hydroxyzine when he needs it. We will refill his medications if necessary. Patient will continue all other conservative therapy and see me again in 6 weeks for further evaluation and treatment. Responded very well again to osteopathic manipulation.

## 2015-02-10 NOTE — Progress Notes (Signed)
[name] [address] [place], [place] [address] Phone: [phone] Subjective:    CC: Headaches, neck pain and left elbow pain follow up  [id number] Daniel [name] is a 64 y.o. male coming in with complaint of headaches and neck pain. Patient has been seen by neurology for this. Patient is taking amitriptyline 10 mg daily as well as has a muscle relaxer for breakthrough pain. Patient saw me and we did work on some postural changes to try to help with this as well. Patient was given ergonomic changes and home exercises. Patient has been feeling somewhat better. Patient is not having any significant pain. Patient states that he can do all activities without anything that seems to be causing any difficulty. Mild tightness at the end of a long day but otherwise feeling very well..  Elbow has resolved.  Past Medical History  Diagnosis Date  . Acne   . Low back pain radiating to both legs   . Diverticulosis of colon (without mention of hemorrhage)     colo 09/2010  . DDD (degenerative disc disease), lumbosacral     chronic back pain   Past Surgical History  Procedure Laterality Date  . Hernia repair    . Tonsillectomy    . Refractive surgery      left eye/ with enhancement   Social History  Substance Use Topics  . Smoking status: Never Smoker   . Smokeless tobacco: Never Used  . Alcohol Use: 4.2 oz/week    7 Shots of liquor per week   No Known Allergies Family History  Problem Relation Age of Onset  . Diabetes Mother   . Hypertension Mother   . Arthritis Mother   . Arthritis Father   . Heart disease Father   . Cancer Neg Hx   . Stroke Neg Hx   . Kidney disease Neg Hx   . Early death Neg Hx   . Diabetes Sister        Past medical history, social, surgical and family history all reviewed in electronic medical record.   Review of Systems: No headache, visual changes, nausea, vomiting, diarrhea, constipation, dizziness, abdominal pain,  skin rash, fevers, chills, night sweats, weight loss, swollen lymph nodes, body aches, joint swelling, muscle aches, chest pain, shortness of breath, mood changes.   Objective Blood pressure 118/82, pulse 66, weight 209 lb (94.802 kg), SpO2 97 %.  General: No apparent distress alert and oriented x3 mood and affect normal, dressed appropriately.  HEENT: Pupils equal, extraocular movements intact  Respiratory: Patient's speak in full sentences and does not appear short of breath  Cardiovascular: No lower extremity edema, non tender, no erythema  Skin: Warm dry intact with no signs of infection or rash on extremities or on axial skeleton.  Abdomen: Soft nontender  Neuro: Cranial nerves II through XII are intact, neurovascularly intact in all extremities with 2+ DTRs and 2+ pulses.  Lymph: No lymphadenopathy of posterior or anterior cervical chain or axillae bilaterally.  Gait normal with good balance and coordination.  MSK:  Non tender with full range of motion and good stability and symmetric strength and tone of shoulders,  wrist, hip, knee and ankles bilaterally.  Neck: Inspection unremarkable. No palpable stepoffs. Negative Spurling's maneuver. Significant improvement with full range of motion and no tightness Grip strength and sensation normal in bilateral hands Strength good C4 to T1 distribution No sensory change to C4 to T1 Negative Hoffman sign bilaterally Reflexes normal   Osteopathic  findings Cervical C2 flexed rotated and side bent left C4 flexed rotated and side bent right Thoracic T2 extended rotated and side bent left with inhaled second rib T4 extended rotated and side bent right     Impression and Recommendations:     This case required medical decision making of moderate complexity.

## 2015-03-23 ENCOUNTER — Other Ambulatory Visit: Payer: Self-pay | Admitting: Orthopedic Surgery

## 2015-03-24 ENCOUNTER — Ambulatory Visit: Payer: BLUE CROSS/BLUE SHIELD | Admitting: Internal Medicine

## 2015-03-25 ENCOUNTER — Other Ambulatory Visit: Payer: Self-pay | Admitting: Neurology

## 2015-03-25 DIAGNOSIS — F0781 Postconcussional syndrome: Secondary | ICD-10-CM

## 2015-03-25 NOTE — Telephone Encounter (Signed)
Last OV: 11/04/14 Next OV: 04/27/15

## 2015-05-04 ENCOUNTER — Telehealth: Payer: Self-pay | Admitting: Family Medicine

## 2015-05-04 NOTE — Telephone Encounter (Signed)
Spoke to pt, scheduled him Wednesday @ 315.

## 2015-05-04 NOTE — Telephone Encounter (Signed)
Patient has twisted knee.  He is needing to be worked in sooner if possible.  Will schedule for next available.

## 2015-05-06 ENCOUNTER — Encounter: Payer: Self-pay | Admitting: Family Medicine

## 2015-05-06 ENCOUNTER — Ambulatory Visit (INDEPENDENT_AMBULATORY_CARE_PROVIDER_SITE_OTHER): Payer: BLUE CROSS/BLUE SHIELD | Admitting: Family Medicine

## 2015-05-06 ENCOUNTER — Ambulatory Visit (INDEPENDENT_AMBULATORY_CARE_PROVIDER_SITE_OTHER): Payer: BLUE CROSS/BLUE SHIELD

## 2015-05-06 ENCOUNTER — Ambulatory Visit (INDEPENDENT_AMBULATORY_CARE_PROVIDER_SITE_OTHER)
Admission: RE | Admit: 2015-05-06 | Discharge: 2015-05-06 | Disposition: A | Discharge status: Home / Self Care | Payer: BLUE CROSS/BLUE SHIELD | Source: Ambulatory Visit | Attending: Family Medicine | Admitting: Family Medicine

## 2015-05-06 VITALS — BP 132/84 | HR 78 | Ht 69.0 in | Wt 212.0 lb

## 2015-05-06 DIAGNOSIS — M25561 Pain in right knee: Secondary | ICD-10-CM

## 2015-05-06 DIAGNOSIS — Z23 Encounter for immunization: Secondary | ICD-10-CM

## 2015-05-06 DIAGNOSIS — S83206A Unspecified tear of unspecified meniscus, current injury, right knee, initial encounter: Secondary | ICD-10-CM | POA: Diagnosis not present

## 2015-05-06 MED ORDER — HYDROXYZINE HCL 10 MG PO TABS: 10.0000 mg | ORAL_TABLET | Freq: Three times a day (TID) | ORAL | Status: DC | PRN

## 2015-05-06 NOTE — Progress Notes (Signed)
[name] [address], [place] [address] Phone: [phone] Subjective:     CC: Right knee pain  [id number] Daniel [name] is a 64 y.o. male coming in with complaint of right knee pain. Patient states that this may happen after a fall long time ago. Seems to be injury noted no more proximally the last 3 weeks. Patient describes the pain as more of a dull, throbbing aching sensation on the medial aspect of the knee. Seems worse with any type of extending of the knee or even flexing. Seems to hurt him with any movement but no pain with rest. Patient still able to do daily activities but seems to be worsening somewhat. Denies any fever, chills, any redness. Denies any giving out on him at this point or any significant instability. Patient though feels sometimes that when twisting he can be severe.     Past medical history, social, surgical and family history all reviewed in electronic medical record.   Review of Systems: No headache, visual changes, nausea, vomiting, diarrhea, constipation, dizziness, abdominal pain, skin rash, fevers, chills, night sweats, weight loss, swollen lymph nodes, body aches, joint swelling, muscle aches, chest pain, shortness of breath, mood changes.   Objective Blood pressure 132/84, pulse 78, height 5\' 9"  (1.753 m), weight 212 lb (96.163 kg), SpO2 94 %.  General: No apparent distress alert and oriented x3 mood and affect normal, dressed appropriately.  HEENT: Pupils equal, extraocular movements intact  Respiratory: Patient's speak in full sentences and does not appear short of breath  Cardiovascular: No lower extremity edema, non tender, no erythema  Skin: Warm dry intact with no signs of infection or rash on extremities or on axial skeleton.  Abdomen: Soft nontender  Neuro: Cranial nerves II through XII are intact, neurovascularly intact in all extremities with 2+ DTRs and 2+ pulses.  Lymph: No lymphadenopathy of  posterior or anterior cervical chain or axillae bilaterally.  Gait normal with good balance and coordination.  MSK:  Non tender with full range of motion and good stability and symmetric strength and tone of shoulders, elbows, wrist, hip, and ankles bilaterally.  Knee: Right Trace effusion noted no erythema or warmth to touch. Diffuse tenderness but more over the medial joint line and mild over the lateral joint line. ROM full in flexion and extension and lower leg rotation. Ligaments with solid consistent endpoints including ACL, PCL, LCL, MCL. Positive Mcmurray's, Apley's, and Thessalonian tests. Non painful patellar compression. Patellar glide without crepitus. Patellar and quadriceps tendons unremarkable. Hamstring and quadriceps strength is normal.  Contralateral knee unremarkable.  MSK US performed of: Right knee This study was ordered, performed, and interpreted by Charlann Boxer D.O.  Knee: All structures visualized. Trace effusion noted in the suprapatellar pouch Anterior lateral meniscus does have a tear with approximately 25% displacement. Hypoechoic changes noted. Increasing Doppler flow. Patellar Tendon unremarkable on long and transverse views without effusion. No abnormality of prepatellar bursa. LCL and MCL unremarkable on long and transverse views. No abnormality of origin of medial or lateral head of the gastrocnemius.  IMPRESSION: Lateral meniscal tear  After informed written and verbal consent, patient was seated on exam table. Right knee was prepped with alcohol swab and utilizing anterolateral approach, patient's right knee space was injected with 4:1  marcaine 0.5%: Kenalog 40mg /dL. Patient tolerated the procedure well without immediate complications.    Impression and Recommendations:     This case required medical decision making of moderate complexity.

## 2015-05-06 NOTE — Assessment & Plan Note (Signed)
Patient given injection today and tolerated the procedure well. We discussed icing regimen and home exercises. We discussed which activities to do an which was to potentially avoid. Patient has no signs or symptoms of any type of infectious at this time. We will get x-rays to further evaluate but I do think patient will have moderate arthritis. Patient was given a brace. This will help him and I would like to see him again in 2-3 weeks to make sure patient is doing better. Patient may need aspiration of the knee if we do not help the swelling at this time.

## 2015-05-06 NOTE — Progress Notes (Signed)
Pre visit review using our clinic review tool, if applicable. No additional management support is needed unless otherwise documented below in the visit note. 

## 2015-05-06 NOTE — Patient Instructions (Addendum)
Great to see you Wear brace with activity Ice 20 minutes 2 times daily. Usually after activity and before bed. Exercises 3 times a week.  Avoid deep squats and twisting motions Duexis 1 pill 3times a day for 6 days.  pennsaid pinkie amount topically 2 times daily as needed.  Xray downstairs See me again in 3 weeks Happy holidays!

## 2015-05-07 ENCOUNTER — Encounter: Payer: Self-pay | Admitting: Neurology

## 2015-05-07 ENCOUNTER — Ambulatory Visit: Payer: BLUE CROSS/BLUE SHIELD | Admitting: Family Medicine

## 2015-05-07 ENCOUNTER — Ambulatory Visit (INDEPENDENT_AMBULATORY_CARE_PROVIDER_SITE_OTHER): Payer: BLUE CROSS/BLUE SHIELD | Admitting: Neurology

## 2015-05-07 VITALS — BP 126/82 | HR 77 | Ht 69.0 in | Wt 213.0 lb

## 2015-05-07 DIAGNOSIS — G44219 Episodic tension-type headache, not intractable: Secondary | ICD-10-CM | POA: Diagnosis not present

## 2015-05-07 NOTE — Patient Instructions (Signed)
Continue amitriptyline 10mg  at bedtime Follow up in 6 months

## 2015-05-07 NOTE — Progress Notes (Signed)
NEUROLOGY FOLLOW UP OFFICE NOTE  Daniel [name] [id number]  HISTORY OF PRESENT ILLNESS: Daniel [name] is a 64 year old right-handed man with no significant past medical history who follows up for tension type headache.  UPDATE: He takes amitriptyline 10mg  daily.  He was prescribed tizanidine 2mg  for neck tension.  He is also treated by Dr. Tamala Julian with OMT.   He is doing well.  He gets a headache about once a week but it is manageable.  It tends to occur in the evening when he is tired.   HISTORY: In July, he was cutting the grass and hit the left side of his scalp on a tree branch.  He walked about another 40 feet before he realized something was wrong.  He noted blood running down his face.  He didn't quite feel right.  The next day, he still didn't feel well and noted neck pain, so he went to urgent care, where an Xray of the cervical spine was reported unremarkable.  He really doesn't remember those first two days very well.  He developed headaches, described as bi-temporal shooting pain, as well as soreness on the back of his neck with a numb feeling on his occiput.  He also has had difficulty focusing.  He has difficulty processing information and cannot follow conversations.  He also has been experiencing insomnia.  He has difficulty falling asleep.  When he finally falls asleep, he sleeps for 9 or 10 hours. He saw his PCP, Dr. Ronnald Ramp.  CT of the head without contrast was performed on 01/06/14, which was unremarkable with no evidence of hemorrhage or contusions.  For the headaches, he was initially taking Aleve daily up until mid August.  He was advised to stop due to potential liver toxicity and he has since been taking Tylenol twice a day.  The Tylenol doesn't work as well as the Kent.  He also went to a chiropractor for a month, which has helped with the neck and head pain.  He has noticed improvement in cognition and people have told him he seems more lucid.  He still has symptoms and is not  back to baseline, however.  He still has been able to go to work.  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  For insomnia, he takes melatonin 2.5mg , which makes him very tired during the day.  He feels fatigued but not depressed.  PAST MEDICAL HISTORY: Past Medical History  Diagnosis Date  . Acne   . Low back pain radiating to both legs   . Diverticulosis of colon (without mention of hemorrhage)     colo 09/2010  . DDD (degenerative disc disease), lumbosacral     chronic back pain    MEDICATIONS: Current Outpatient Prescriptions on File Prior to Visit  Medication Sig Dispense Refill  . amitriptyline (ELAVIL) 10 MG tablet TAKE 1 TABLET EVERY DAY AT BEDTIME 30 tablet 1  . DOXYCYCLINE, ROSACEA, PO Take by mouth as needed.     . hydrOXYzine (ATARAX/VISTARIL) 10 MG tablet Take 1 tablet (10 mg total) by mouth 3 (three) times daily as needed. 90 tablet 0  . Multiple Vitamins-Minerals (MULTIVITAMIN PO) Take by mouth.    . Omega-3 Fatty Acids (FISH OIL) 1200 MG CAPS Take 1 capsule by mouth every morning.      . vitamin B-12 (CYANOCOBALAMIN) 1000 MCG tablet Take 1,000 mcg by mouth daily.       No current facility-administered medications on file prior to visit.    ALLERGIES: No  Known Allergies  FAMILY HISTORY: Family History  Problem Relation Age of Onset  . Diabetes Mother   . Hypertension Mother   . Arthritis Mother   . Arthritis Father   . Heart disease Father   . Cancer Neg Hx   . Stroke Neg Hx   . Kidney disease Neg Hx   . Early death Neg Hx   . Diabetes Sister     SOCIAL HISTORY: Social History   Social History  . Marital Status: Single    Spouse Name: N/A  . Number of Children: N/A  . Years of Education: N/A   Occupational History  . Not on file.   Social History Main Topics  . Smoking status: Never Smoker   . Smokeless tobacco: Never Used  . Alcohol Use: 4.2 oz/week    7 Shots of liquor per week  . Drug Use: No  . Sexual Activity: No   Other Topics Concern  . Not on  file   Social History Narrative   Caffienated beverages-Yes   Seat belts often-Yes   Smoke alarm in home-Yes   Firearms/guns in home-Yes   History of physical abuse-NO   HLE- Masters degree    REVIEW OF SYSTEMS: Constitutional: No fevers, chills, or sweats, no generalized fatigue, change in appetite Eyes: No visual changes, double vision, eye pain Ear, nose and throat: No hearing loss, ear pain, nasal congestion, sore throat Cardiovascular: No chest pain, palpitations Respiratory:  No shortness of breath at rest or with exertion, wheezes GastrointestinaI: No nausea, vomiting, diarrhea, abdominal pain, fecal incontinence Genitourinary:  No dysuria, urinary retention or frequency Musculoskeletal:  No neck pain, back pain Integumentary: No rash, pruritus, skin lesions Neurological: as above Psychiatric: No depression, insomnia, anxiety Endocrine: No palpitations, fatigue, diaphoresis, mood swings, change in appetite, change in weight, increased thirst Hematologic/Lymphatic:  No anemia, purpura, petechiae. Allergic/Immunologic: no itchy/runny eyes, nasal congestion, recent allergic reactions, rashes  PHYSICAL EXAM: Filed Vitals:   05/07/15 0906  BP: 126/82  Pulse: 77   General: No acute distress.  Patient appears well-groomed.  normal body habitus. Head:  Normocephalic/atraumatic Eyes:  Fundoscopic exam unremarkable without vessel changes, exudates, hemorrhages or papilledema. Neck: supple, no paraspinal tenderness, full range of motion Heart:  Regular rate and rhythm Lungs:  Clear to auscultation bilaterally Back: No paraspinal tenderness Neurological Exam: alert and oriented to person, place, and time. Attention span and concentration intact, recent and remote memory intact, fund of knowledge intact.  Speech fluent and not dysarthric, language intact.  CN II-XII intact. Fundoscopic exam unremarkable without vessel changes, exudates, hemorrhages or papilledema.  Bulk and tone  normal, muscle strength limited in right knee due to pain but otherwise 5/5 throughout.  Sensation to light touch intact.  Deep tendon reflexes 2+ throughout.  Finger to nose and heel to shin testing intact.  Gait normal.  IMPRESSION: Episodic tension-type headache  PLAN: 1.  Continue amitriptyline 10mg  at bedtime 2.  Follow up in 6 months.  15 minutes spent face to face with patient, over 50% spent discussing management.  Metta Clines, DO  CC:  Scarlette Calico, MD

## 2015-05-23 ENCOUNTER — Other Ambulatory Visit: Payer: Self-pay | Admitting: Neurology

## 2015-05-26 NOTE — Telephone Encounter (Signed)
Last OV: 05/07/15 Next OV: 11/09/15

## 2015-05-27 ENCOUNTER — Ambulatory Visit (INDEPENDENT_AMBULATORY_CARE_PROVIDER_SITE_OTHER): Payer: BLUE CROSS/BLUE SHIELD | Admitting: Family Medicine

## 2015-05-27 ENCOUNTER — Encounter: Payer: Self-pay | Admitting: Family Medicine

## 2015-05-27 VITALS — BP 114/82 | HR 62 | Ht 69.0 in | Wt 211.0 lb

## 2015-05-27 DIAGNOSIS — S83206D Unspecified tear of unspecified meniscus, current injury, right knee, subsequent encounter: Secondary | ICD-10-CM

## 2015-05-27 NOTE — Patient Instructions (Signed)
Good to see you Ice is your friend Topical anti-inflammatory  Keep trucking along See me again in 60 weeks Happy New Year!

## 2015-05-27 NOTE — Progress Notes (Signed)
Pre visit review using our clinic review tool, if applicable. No additional management support is needed unless otherwise documented below in the visit note. 

## 2015-05-27 NOTE — Assessment & Plan Note (Signed)
Seems to be doing much better at this time. Only a very small effusion noted. Patient seems to have increase his activity but is still having some mild discomfort over the meniscus. I believe the patient can do well with conservative therapy. Declined formal physical therapy or bracing. Patient will continue with the home exercises. Follow up again in 6 weeks. Any internal derangement signs occur patient will call sooner.

## 2015-05-27 NOTE — Progress Notes (Signed)
  [name] [address], [place] [address] Phone: [phone] Subjective:     CC: Right knee pain f/u [id number] Daniel [name] is a 65 y.o. male coming in with complaint of right knee pain..  Patient was found to have a significant knee effusion with a lateral meniscal tear. Patient was given an injection was also given home exercises and bracing. Patient was doing quite well. Patient states he is a proximal wing 90% better. Overall is feeling better. Not having any of the severe pain. Still having pain though with twisting motion.   Patient had x-rays taken at last exam. These were independently visualized me today. Patient had moderate joint effusion and otherwise very minimal arthritic changes.  Past medical history, social, surgical and family history all reviewed in electronic medical record.   Review of Systems: No headache, visual changes, nausea, vomiting, diarrhea, constipation, dizziness, abdominal pain, skin rash, fevers, chills, night sweats, weight loss, swollen lymph nodes, body aches, joint swelling, muscle aches, chest pain, shortness of breath, mood changes.   Objective Blood pressure 114/82, pulse 62, height 5\' 9"  (1.753 m), weight 211 lb (95.709 kg), SpO2 98 %.  General: No apparent distress alert and oriented x3 mood and affect normal, dressed appropriately.  HEENT: Pupils equal, extraocular movements intact  Respiratory: Patient's speak in full sentences and does not appear short of breath  Cardiovascular: No lower extremity edema, non tender, no erythema  Skin: Warm dry intact with no signs of infection or rash on extremities or on axial skeleton.  Abdomen: Soft nontender  Neuro: Cranial nerves II through XII are intact, neurovascularly intact in all extremities with 2+ DTRs and 2+ pulses.  Lymph: No lymphadenopathy of posterior or anterior cervical chain or axillae bilaterally.  Gait normal with good balance and  coordination.  MSK:  Non tender with full range of motion and good stability and symmetric strength and tone of shoulders, elbows, wrist, hip, and ankles bilaterally.  Knee: Right No effusion noted and only minimally tender over the lateral joint line. ROM full in flexion and extension and lower leg rotation. Ligaments with solid consistent endpoints including ACL, PCL, LCL, MCL. Positive Mcmurray's, Apley's, and Thessalonian test still present. Non painful patellar compression. Patellar glide without crepitus. Patellar and quadriceps tendons unremarkable. Hamstring and quadriceps strength is normal.  Contralateral knee unremarkable.     Impression and Recommendations:     This case required medical decision making of moderate complexity.

## 2015-06-17 ENCOUNTER — Encounter (HOSPITAL_BASED_OUTPATIENT_CLINIC_OR_DEPARTMENT_OTHER): Payer: Self-pay | Admitting: *Deleted

## 2015-06-23 ENCOUNTER — Encounter (HOSPITAL_BASED_OUTPATIENT_CLINIC_OR_DEPARTMENT_OTHER): Payer: Self-pay | Admitting: Orthopedic Surgery

## 2015-06-23 ENCOUNTER — Ambulatory Visit (HOSPITAL_BASED_OUTPATIENT_CLINIC_OR_DEPARTMENT_OTHER)
Admission: RE | Admit: 2015-06-23 | Discharge: 2015-06-23 | Disposition: A | Discharge status: Home / Self Care | Payer: BLUE CROSS/BLUE SHIELD | Source: Ambulatory Visit | Attending: Orthopedic Surgery | Admitting: Orthopedic Surgery

## 2015-06-23 ENCOUNTER — Ambulatory Visit (HOSPITAL_BASED_OUTPATIENT_CLINIC_OR_DEPARTMENT_OTHER): Payer: BLUE CROSS/BLUE SHIELD | Admitting: Certified Registered"

## 2015-06-23 ENCOUNTER — Encounter (HOSPITAL_BASED_OUTPATIENT_CLINIC_OR_DEPARTMENT_OTHER)
Admission: RE | Disposition: A | Discharge status: Home / Self Care | Payer: Self-pay | Source: Ambulatory Visit | Attending: Orthopedic Surgery

## 2015-06-23 DIAGNOSIS — M549 Dorsalgia, unspecified: Secondary | ICD-10-CM | POA: Insufficient documentation

## 2015-06-23 DIAGNOSIS — M1990 Unspecified osteoarthritis, unspecified site: Secondary | ICD-10-CM | POA: Diagnosis not present

## 2015-06-23 DIAGNOSIS — G8929 Other chronic pain: Secondary | ICD-10-CM | POA: Diagnosis not present

## 2015-06-23 DIAGNOSIS — M67442 Ganglion, left hand: Secondary | ICD-10-CM | POA: Diagnosis not present

## 2015-06-23 DIAGNOSIS — Z79899 Other long term (current) drug therapy: Secondary | ICD-10-CM | POA: Insufficient documentation

## 2015-06-23 DIAGNOSIS — R2232 Localized swelling, mass and lump, left upper limb: Secondary | ICD-10-CM | POA: Diagnosis present

## 2015-06-23 HISTORY — PX: CYST EXCISION: SHX5701

## 2015-06-23 HISTORY — DX: Anxiety disorder, unspecified: F41.9

## 2015-06-23 SURGERY — CYST REMOVAL
Anesthesia: Monitor Anesthesia Care | Site: Finger | Laterality: Left

## 2015-06-23 MED ORDER — FENTANYL CITRATE (PF) 100 MCG/2ML IJ SOLN
INTRAMUSCULAR | Status: AC
  Filled 2015-06-23: qty 2

## 2015-06-23 MED ORDER — CEFAZOLIN SODIUM-DEXTROSE 2-3 GM-% IV SOLR
2.0000 g | INTRAVENOUS | Status: AC
  Administered 2015-06-23: 2 g via INTRAVENOUS

## 2015-06-23 MED ORDER — HYDROCODONE-ACETAMINOPHEN 5-325 MG PO TABS: 1.0000 | ORAL_TABLET | Freq: Four times a day (QID) | ORAL | Status: DC | PRN

## 2015-06-23 MED ORDER — CEFAZOLIN SODIUM-DEXTROSE 2-3 GM-% IV SOLR
INTRAVENOUS | Status: AC
  Filled 2015-06-23: qty 50

## 2015-06-23 MED ORDER — CHLORHEXIDINE GLUCONATE 4 % EX LIQD: 60.0000 mL | Freq: Once | CUTANEOUS | Status: DC

## 2015-06-23 MED ORDER — ONDANSETRON HCL 4 MG/2ML IJ SOLN
INTRAMUSCULAR | Status: DC | PRN
  Administered 2015-06-23: 4 mg via INTRAVENOUS

## 2015-06-23 MED ORDER — FENTANYL CITRATE (PF) 100 MCG/2ML IJ SOLN
50.0000 ug | INTRAMUSCULAR | Status: DC | PRN
  Administered 2015-06-23 (×2): 50 ug via INTRAVENOUS

## 2015-06-23 MED ORDER — PROPOFOL 10 MG/ML IV BOLUS
INTRAVENOUS | Status: DC | PRN
  Administered 2015-06-23 (×3): 20 mg via INTRAVENOUS

## 2015-06-23 MED ORDER — LIDOCAINE HCL (CARDIAC) 20 MG/ML IV SOLN
INTRAVENOUS | Status: AC
  Filled 2015-06-23: qty 5

## 2015-06-23 MED ORDER — GLYCOPYRROLATE 0.2 MG/ML IJ SOLN: 0.2000 mg | Freq: Once | INTRAMUSCULAR | Status: DC | PRN

## 2015-06-23 MED ORDER — SCOPOLAMINE 1 MG/3DAYS TD PT72: 1.0000 | MEDICATED_PATCH | Freq: Once | TRANSDERMAL | Status: DC | PRN

## 2015-06-23 MED ORDER — MIDAZOLAM HCL 2 MG/2ML IJ SOLN
1.0000 mg | INTRAMUSCULAR | Status: DC | PRN
Start: 2015-06-23 — End: 2015-06-23
  Administered 2015-06-23: 2 mg via INTRAVENOUS

## 2015-06-23 MED ORDER — CEFAZOLIN SODIUM-DEXTROSE 2-3 GM-% IV SOLR: 2.0000 g | INTRAVENOUS | Status: DC

## 2015-06-23 MED ORDER — MEPERIDINE HCL 25 MG/ML IJ SOLN: 6.2500 mg | INTRAMUSCULAR | Status: DC | PRN

## 2015-06-23 MED ORDER — ONDANSETRON HCL 4 MG/2ML IJ SOLN
INTRAMUSCULAR | Status: AC
  Filled 2015-06-23: qty 2

## 2015-06-23 MED ORDER — LACTATED RINGERS IV SOLN: INTRAVENOUS | Status: DC

## 2015-06-23 MED ORDER — PROMETHAZINE HCL 25 MG/ML IJ SOLN: 6.2500 mg | INTRAMUSCULAR | Status: DC | PRN

## 2015-06-23 MED ORDER — BUPIVACAINE HCL (PF) 0.25 % IJ SOLN
INTRAMUSCULAR | Status: DC | PRN
  Administered 2015-06-23: 7 mL

## 2015-06-23 MED ORDER — HYDROMORPHONE HCL 1 MG/ML IJ SOLN: 0.2500 mg | INTRAMUSCULAR | Status: DC | PRN

## 2015-06-23 MED ORDER — MIDAZOLAM HCL 2 MG/2ML IJ SOLN
INTRAMUSCULAR | Status: AC
  Filled 2015-06-23: qty 2

## 2015-06-23 MED ORDER — LACTATED RINGERS IV SOLN
INTRAVENOUS | Status: DC
  Administered 2015-06-23 (×2): via INTRAVENOUS

## 2015-06-23 SURGICAL SUPPLY — 47 items
BANDAGE COBAN STERILE 2 (GAUZE/BANDAGES/DRESSINGS) IMPLANT
BLADE MINI RND TIP GREEN BEAV (BLADE) ×2 IMPLANT
BLADE SURG 15 STRL LF DISP TIS (BLADE) ×1 IMPLANT
BLADE SURG 15 STRL SS (BLADE) ×1
BNDG COHESIVE 1X5 TAN STRL LF (GAUZE/BANDAGES/DRESSINGS) ×2 IMPLANT
BNDG COHESIVE 3X5 TAN STRL LF (GAUZE/BANDAGES/DRESSINGS) IMPLANT
BNDG ESMARK 4X9 LF (GAUZE/BANDAGES/DRESSINGS) IMPLANT
BNDG GAUZE ELAST 4 BULKY (GAUZE/BANDAGES/DRESSINGS) IMPLANT
CHLORAPREP W/TINT 26ML (MISCELLANEOUS) ×2 IMPLANT
CORDS BIPOLAR (ELECTRODE) ×2 IMPLANT
COVER BACK TABLE 60X90IN (DRAPES) ×2 IMPLANT
COVER MAYO STAND STRL (DRAPES) ×2 IMPLANT
CUFF TOURNIQUET SINGLE 18IN (TOURNIQUET CUFF) IMPLANT
DECANTER SPIKE VIAL GLASS SM (MISCELLANEOUS) IMPLANT
DRAIN PENROSE 1/2X12 LTX STRL (WOUND CARE) IMPLANT
DRAPE EXTREMITY T 121X128X90 (DRAPE) ×2 IMPLANT
DRAPE SURG 17X23 STRL (DRAPES) ×2 IMPLANT
GAUZE SPONGE 4X4 12PLY STRL (GAUZE/BANDAGES/DRESSINGS) ×2 IMPLANT
GAUZE XEROFORM 1X8 LF (GAUZE/BANDAGES/DRESSINGS) ×2 IMPLANT
GLOVE BIOGEL PI IND STRL 7.0 (GLOVE) ×1 IMPLANT
GLOVE BIOGEL PI IND STRL 8.5 (GLOVE) ×1 IMPLANT
GLOVE BIOGEL PI INDICATOR 7.0 (GLOVE) ×1
GLOVE BIOGEL PI INDICATOR 8.5 (GLOVE) ×1
GLOVE ECLIPSE 6.5 STRL STRAW (GLOVE) ×2 IMPLANT
GLOVE SURG ORTHO 8.0 STRL STRW (GLOVE) ×2 IMPLANT
GOWN STRL REUS W/ TWL LRG LVL3 (GOWN DISPOSABLE) ×1 IMPLANT
GOWN STRL REUS W/TWL LRG LVL3 (GOWN DISPOSABLE) ×1
GOWN STRL REUS W/TWL XL LVL3 (GOWN DISPOSABLE) ×2 IMPLANT
NEEDLE PRECISIONGLIDE 27X1.5 (NEEDLE) IMPLANT
NS IRRIG 1000ML POUR BTL (IV SOLUTION) ×2 IMPLANT
PACK BASIN DAY SURGERY FS (CUSTOM PROCEDURE TRAY) ×2 IMPLANT
PAD CAST 3X4 CTTN HI CHSV (CAST SUPPLIES) IMPLANT
PADDING CAST ABS 3INX4YD NS (CAST SUPPLIES)
PADDING CAST ABS 4INX4YD NS (CAST SUPPLIES) ×1
PADDING CAST ABS COTTON 3X4 (CAST SUPPLIES) IMPLANT
PADDING CAST ABS COTTON 4X4 ST (CAST SUPPLIES) ×1 IMPLANT
PADDING CAST COTTON 3X4 STRL (CAST SUPPLIES)
SPLINT PLASTER CAST XFAST 3X15 (CAST SUPPLIES) IMPLANT
SPLINT PLASTER XTRA FASTSET 3X (CAST SUPPLIES)
STOCKINETTE 4X48 STRL (DRAPES) ×2 IMPLANT
SUT ETHILON 4 0 PS 2 18 (SUTURE) ×2 IMPLANT
SUT MERSILENE 5 0 P 3 (SUTURE) ×2 IMPLANT
SUT VIC AB 4-0 P2 18 (SUTURE) IMPLANT
SYR BULB 3OZ (MISCELLANEOUS) ×2 IMPLANT
SYR CONTROL 10ML LL (SYRINGE) IMPLANT
TOWEL OR 17X24 6PK STRL BLUE (TOWEL DISPOSABLE) ×4 IMPLANT
UNDERPAD 30X30 (UNDERPADS AND DIAPERS) ×2 IMPLANT

## 2015-06-23 NOTE — Transfer of Care (Signed)
Immediate Anesthesia Transfer of Care Note  Patient: Daniel [name]  Procedure(s) Performed: Procedure(s) with comments: EXCISION MASS LEFT INDEX FINGER (Left) - ANESTHESIA: IV REGIONAL/FAB  Patient Location: PACU  Anesthesia Type:Bier block  Level of Consciousness: awake, alert , oriented and patient cooperative  Airway & Oxygen Therapy: Patient Spontanous Breathing and Patient connected to face mask oxygen  Post-op Assessment: Report given to RN and Post -op Vital signs reviewed and stable  Post vital signs: Reviewed and stable  Last Vitals:  Filed Vitals:   06/23/15 1019  BP: 144/94  Pulse: 65  Temp: 36.8 C  Resp: 20    Complications: No apparent anesthesia complications

## 2015-06-23 NOTE — Discharge Instructions (Addendum)

## 2015-06-23 NOTE — Anesthesia Preprocedure Evaluation (Addendum)
Anesthesia Evaluation  Patient identified by MRN, date of birth, ID band Patient awake    Reviewed: Allergy & Precautions, NPO status , Patient's Chart, lab work & pertinent test results  Airway Mallampati: II  TM Distance: >3 FB Neck ROM: Full    Dental  (+) Teeth Intact   Pulmonary neg pulmonary ROS,    breath sounds clear to auscultation       Cardiovascular negative cardio ROS   Rhythm:Regular Rate:Normal     Neuro/Psych  Headaches, PSYCHIATRIC DISORDERS Anxiety    GI/Hepatic negative GI ROS, Neg liver ROS,   Endo/Other  negative endocrine ROS  Renal/GU negative Renal ROS  negative genitourinary   Musculoskeletal  (+) Arthritis , Osteoarthritis,    Abdominal   Peds negative pediatric ROS (+)  Hematology negative hematology ROS (+)   Anesthesia Other Findings   Reproductive/Obstetrics negative OB ROS                            Lab Results  Component Value Date   WBC 6.1 04/24/2013   HGB 15.4 04/24/2013   HCT 45.6 04/24/2013   MCV 88.7 04/24/2013   PLT 277.0 04/24/2013     Anesthesia Physical Anesthesia Plan  ASA: II  Anesthesia Plan: MAC and Bier Block   Post-op Pain Management:    Induction: Intravenous  Airway Management Planned: Natural Airway  Additional Equipment:   Intra-op Plan:   Post-operative Plan:   Informed Consent: I have reviewed the patients History and Physical, chart, labs and discussed the procedure including the risks, benefits and alternatives for the proposed anesthesia with the patient or authorized representative who has indicated his/her understanding and acceptance.   Dental advisory given  Plan Discussed with: CRNA  Anesthesia Plan Comments:         Anesthesia Quick Evaluation

## 2015-06-23 NOTE — Anesthesia Procedure Notes (Addendum)
Anesthesia Regional Block:  Bier block (IV Regional)  Pre-Anesthetic Checklist: ,, timeout performed, Correct Patient, Correct Site, Correct Laterality, Correct Procedure,, site marked, surgical consent,, at surgeon's request Needles:  Injection technique: Single-shot  Needle Type: Other      Needle Gauge: 20 and 20 G    Additional Needles: Bier block (IV Regional) Narrative:   Performed by: Personally    Procedure Name: MAC Date/Time: 06/23/2015 11:30 AM Performed by: Nashaun Hillmer D Pre-anesthesia Checklist: Patient identified, Emergency Drugs available, Suction available, Patient being monitored and Timeout performed Patient Re-evaluated:Patient Re-evaluated prior to inductionOxygen Delivery Method: Simple face mask

## 2015-06-23 NOTE — Anesthesia Postprocedure Evaluation (Signed)
Anesthesia Post Note  Patient: Daniel [name]  Procedure(s) Performed: Procedure(s) (LRB): EXCISION MASS LEFT INDEX FINGER (Left)  Patient location during evaluation: PACU Anesthesia Type: MAC and Bier Block Level of consciousness: awake and alert Pain management: pain level controlled Vital Signs Assessment: post-procedure vital signs reviewed and stable Respiratory status: spontaneous breathing, nonlabored ventilation, respiratory function stable and patient connected to nasal cannula oxygen Cardiovascular status: stable and blood pressure returned to baseline Anesthetic complications: no    Last Vitals:  Filed Vitals:   06/23/15 1229 06/23/15 1244  BP: 129/95 134/89  Pulse: 60 53  Temp:  36.4 C  Resp: 19 16    Last Pain:  Filed Vitals:   06/23/15 1244  PainSc: 0-No pain                 Effie Berkshire

## 2015-06-23 NOTE — H&P (Signed)
  Daniel [name] is an 65 y.o. male.   Chief Complaint: mass left index finger HPI: Daniel [name] is a 65 yo male with a mass on the dorsal aspect at the PIP joint of his left index finger. Ultrasound reveals this to be solid.He has no history of trauma to the area. He desires to have this removed.  Past Medical History  Diagnosis Date  . Acne   . Low back pain radiating to both legs   . Diverticulosis of colon (without mention of hemorrhage)     colo 09/2010  . DDD (degenerative disc disease), lumbosacral     chronic back pain  . Anxiety     Past Surgical History  Procedure Laterality Date  . Hernia repair    . Tonsillectomy    . Refractive surgery      left eye/ with enhancement    Family History  Problem Relation Age of Onset  . Diabetes Mother   . Hypertension Mother   . Arthritis Mother   . Arthritis Father   . Heart disease Father   . Cancer Neg Hx   . Stroke Neg Hx   . Kidney disease Neg Hx   . Early death Neg Hx   . Diabetes Sister    Social History:  reports that he has never smoked. He has never used smokeless tobacco. He reports that he drinks about 4.2 oz of alcohol per week. He reports that he does not use illicit drugs.  Allergies: No Known Allergies  No prescriptions prior to admission    No results found for this or any previous visit (from the past 48 hour(s)).  No results found.   Pertinent items noted in HPI and remainder of comprehensive ROS otherwise negative.  Height 5\' 9"  (1.753 m), weight 95.709 kg (211 lb).  General appearance: alert, cooperative and appears stated age Head: Normocephalic, without obvious abnormality Neck: no JVD Resp: clear to auscultation bilaterally Cardio: regular rate and rhythm, S1, S2 normal, no murmur, click, rub or gallop GI: soft, non-tender; bowel sounds normal; no masses,  no organomegaly Extremities: mass left index finger Pulses: 2+ and symmetric Skin: Skin color, texture, turgor normal. No rashes or  lesions Neurologic: Grossly normal Incision/Wound: na  Assessment/Plan Dx: solid tumor left index finger  Plan; Excision mass PIP left index finger. Pre, peri and postoperative course were discussed along with the risks and complications.  The patient is aware there is no guarantee with the surgery, possibility of infection, recurrence, injury to arteries, nerves, tendons, incomplete relief of symptoms and dystrophy.    Daniel [name] R 06/23/2015, 9:55 AM

## 2015-06-23 NOTE — Op Note (Signed)
Dictation Number [id number]

## 2015-06-23 NOTE — Brief Op Note (Signed)
06/23/2015  12:00 PM  PATIENT:  Daniel [name]  65 y.o. male  PRE-OPERATIVE DIAGNOSIS:  MASS LEFT INDEX  POST-OPERATIVE DIAGNOSIS:  MASS LEFT INDEX  PROCEDURE:  Procedure(s) with comments: EXCISION MASS LEFT INDEX FINGER (Left) - ANESTHESIA: IV REGIONAL/FAB  SURGEON:  Surgeon(s) and Role:    * Daryll Brod, MD - Primary  PHYSICIAN ASSISTANT:   ASSISTANTS: none   ANESTHESIA:   local and regional  EBL:  Total I/O In: 800 [I.V.:800] Out: -   BLOOD ADMINISTERED:none  DRAINS: none   LOCAL MEDICATIONS USED:  BUPIVICAINE   SPECIMEN:  Excision  DISPOSITION OF SPECIMEN:  PATHOLOGY  COUNTS:  YES  TOURNIQUET:   Total Tourniquet Time Documented: Forearm (Left) - 20 minutes Total: Forearm (Left) - 20 minutes   DICTATION: .Other Dictation: Dictation Number [id number]  PLAN OF CARE: Discharge to home after PACU  PATIENT DISPOSITION:  PACU - hemodynamically stable.

## 2015-06-23 NOTE — Op Note (Signed)
Daniel [name], Daniel [name] NO.:  [id number]  MEDICAL RECORD NO.:  [id number]  LOCATION:                                 FACILITY:  PHYSICIAN:  Daryll Brod, M.D.       DATE OF BIRTH:  [birth year]  DATE OF PROCEDURE:  06/23/2015 DATE OF DISCHARGE:                              OPERATIVE REPORT   PREOPERATIVE DIAGNOSIS:  Mass of proximal interphalangeal joint (PIP), left index finger.  POSTOPERATIVE DIAGNOSIS:  Mass of proximal interphalangeal joint (PIP), left index finger.  OPERATION:  Excision cyst debridement of proximal interphalangeal joint, left index finger.  SURGEON:  Daryll Brod, M.D.  ASSISTANT:  None.  ANESTHESIA:  Forearm IV regional with local infiltration.  HISTORY:  The patient is a 65 year old male, with a history of a mass over the PIP joint of his left index finger.  Ultrasound reveals this appeared to be solid.  He is desirous having this excised.  Pre, peri, and postoperative course have been discussed along with risks and complications.  He is aware that there is no guarantee with the surgery; possibility of infection; recurrence of injury to arteries, nerves, tendons; incomplete relief of symptoms; and dystrophy.  In the preoperative area, the patient was seen, the extremity marked by both the patient and surgeon.  Antibiotic given.  PROCEDURE IN DETAIL:  The patient was brought to the operating room, where forearm-based IV regional anesthetic was carried out without difficulty.  He was prepped using ChloraPrep, supine position, left arm free.  A 3-minute dry time was allowed.  Time-out taken, confirming the patient and procedure.  A metacarpal block was given with 0.25% bupivacaine without epinephrine, 7 mL was used.  A curvilinear incision was made over the PIP joint mass, carried down through subcutaneous tissue.  Bleeders were electrocauterized with bipolar.  A cystic lesion within the extensor tendon was identified.  This was opened,  sized, and sent to Pathology.  The joint was then debrided with a small hemostatic rongeur, having been entered, taking the cystic lesion out.  There was marked thickening of the extensor tendon, where the cyst was.  The wound was copiously irrigated with saline.  The extensor tendon was then repaired with a running 5-0 Mersilene suture.  The entrance into the tendon was at the ulnar aspect of the central slip, junction of the lateral band, the skin was then closed with interrupted 4-0 nylon sutures.  A sterile compressive dressing, a volar splint was applied. The PIP joint.  Deflation of the tourniquet, remaining fingers pinked. She was taken to the recovery room for observation in satisfactory condition.  He will be discharged home to return to the [facility] in 1 week on [place].          ______________________________ Daryll Brod, M.D.     GK/MEDQ  D:  06/23/2015  T:  06/23/2015  Job:  [id number]

## 2015-06-24 ENCOUNTER — Encounter (HOSPITAL_BASED_OUTPATIENT_CLINIC_OR_DEPARTMENT_OTHER): Payer: Self-pay | Admitting: Orthopedic Surgery

## 2015-06-25 ENCOUNTER — Encounter: Payer: Self-pay | Admitting: Neurology

## 2015-07-10 ENCOUNTER — Other Ambulatory Visit (INDEPENDENT_AMBULATORY_CARE_PROVIDER_SITE_OTHER): Payer: BLUE CROSS/BLUE SHIELD

## 2015-07-10 ENCOUNTER — Encounter: Payer: Self-pay | Admitting: Family Medicine

## 2015-07-10 ENCOUNTER — Ambulatory Visit (INDEPENDENT_AMBULATORY_CARE_PROVIDER_SITE_OTHER): Payer: BLUE CROSS/BLUE SHIELD | Admitting: Family Medicine

## 2015-07-10 DIAGNOSIS — S83206D Unspecified tear of unspecified meniscus, current injury, right knee, subsequent encounter: Secondary | ICD-10-CM | POA: Diagnosis not present

## 2015-07-10 DIAGNOSIS — M25512 Pain in left shoulder: Secondary | ICD-10-CM

## 2015-07-10 DIAGNOSIS — M2550 Pain in unspecified joint: Secondary | ICD-10-CM | POA: Insufficient documentation

## 2015-07-10 DIAGNOSIS — M7552 Bursitis of left shoulder: Secondary | ICD-10-CM | POA: Diagnosis not present

## 2015-07-10 DIAGNOSIS — M7550 Bursitis of unspecified shoulder: Secondary | ICD-10-CM | POA: Insufficient documentation

## 2015-07-10 LAB — COMPREHENSIVE METABOLIC PANEL
ALK PHOS: 65 U/L (ref 39–117)
ALT: 14 U/L (ref 0–53)
AST: 15 U/L (ref 0–37)
Albumin: 4.2 g/dL (ref 3.5–5.2)
BILIRUBIN TOTAL: 0.5 mg/dL (ref 0.2–1.2)
BUN: 13 mg/dL (ref 6–23)
CO2: 27 mEq/L (ref 19–32)
Calcium: 9.6 mg/dL (ref 8.4–10.5)
Chloride: 105 mEq/L (ref 96–112)
Creatinine, Ser: 0.85 mg/dL (ref 0.40–1.50)
GFR: 96.95 mL/min (ref >60.00)
GLUCOSE: 88 mg/dL (ref 70–99)
Potassium: 4.3 mEq/L (ref 3.5–5.1)
Sodium: 139 mEq/L (ref 135–145)
TOTAL PROTEIN: 7.3 g/dL (ref 6.0–8.3)

## 2015-07-10 LAB — CBC WITH DIFFERENTIAL/PLATELET
BASOS ABS: 0 10*3/uL (ref 0.0–0.1)
BASOS PCT: 0.7 % (ref 0.0–3.0)
EOS ABS: 0.1 10*3/uL (ref 0.0–0.7)
Eosinophils Relative: 1.8 % (ref 0.0–5.0)
HCT: 46.3 % (ref 39.0–52.0)
Hemoglobin: 15.7 g/dL (ref 13.0–17.0)
LYMPHS ABS: 2.6 10*3/uL (ref 0.7–4.0)
Lymphocytes Relative: 45 % (ref 12.0–46.0)
MCHC: 33.9 g/dL (ref 30.0–36.0)
MCV: 89 fl (ref 78.0–100.0)
Monocytes Absolute: 0.6 10*3/uL (ref 0.1–1.0)
Monocytes Relative: 10.8 % (ref 3.0–12.0)
NEUTROS ABS: 2.4 10*3/uL (ref 1.4–7.7)
NEUTROS PCT: 41.7 % — AB (ref 43.0–77.0)
PLATELETS: 247 10*3/uL (ref 150.0–400.0)
RBC: 5.21 Mil/uL (ref 4.22–5.81)
RDW: 14 % (ref 11.5–15.5)
WBC: 5.7 10*3/uL (ref 4.0–10.5)

## 2015-07-10 LAB — URIC ACID: URIC ACID, SERUM: 5.2 mg/dL (ref 4.0–7.8)

## 2015-07-10 LAB — SEDIMENTATION RATE: Sed Rate: 15 mm/hr (ref 0–22)

## 2015-07-10 MED ORDER — DICLOFENAC SODIUM 2 % TD SOLN: TRANSDERMAL | Status: DC

## 2015-07-10 MED ORDER — DICLOFENAC SODIUM 2 % TD SOLN: 2.0000 "application " | Freq: Two times a day (BID) | TRANSDERMAL | Status: DC

## 2015-07-10 NOTE — Patient Instructions (Signed)
Good to see you  Physical therapy will be calling  pennsaid pinkie amount topically 2 times daily as needed.  Duexis 3 times a day for 3 days Get labs downstairs today  Keep trucking along See me again in 3-4 weeks and if not better then we will consider injections and possible MRI of the knee.

## 2015-07-10 NOTE — Progress Notes (Signed)
Pre visit review using our clinic review tool, if applicable. No additional management support is needed unless otherwise documented below in the visit note. 

## 2015-07-10 NOTE — Progress Notes (Signed)
[name] [name], [name] [address] Phone: [phone] Subjective:     CC: Right knee pain f/u Worsening left shoulder pain [id number] Daniel [name] is a 65 y.o. male coming in with complaint of right knee pain..  Patient was found to have a significant knee effusion with a lateral meniscal tear. Patient was given an injection was also given home exercises and bracing. Patient 6 weeks ago was doing approximately 90% better. States that it is actually worsening again. His been doing more activity building a building for his horse. Patient states since he been more in a flexed position for a long amount of time he is having more pain. Sometimes has the swelling again. Has not notice any other association except for the activity. States that in 2 or 3 stairs can cause severe pain. States that the discomfort at night can keep, but does not wake him up at night. Sometimes has an audible popping sensation that can be painful.  Patient is also having left shoulder pain. States that it seems to be increasing with the increasing activity as well. States that it can be uncomfortable at night. Sometimes has difficulty lifting anything heavy. Denies any numbness. Rates the severity of pain a 6 out of 10. Does respond to anti-inflammatories when he does take it.   Patient had x-ray, independently visualized by me.Patient had moderate joint effusion and otherwise very minimal arthritic changes.  Past Medical History  Diagnosis Date  . Acne   . Low back pain radiating to both legs   . Diverticulosis of colon (without mention of hemorrhage)     colo 09/2010  . DDD (degenerative disc disease), lumbosacral     chronic back pain  . Anxiety    Past Surgical History  Procedure Laterality Date  . Hernia repair    . Tonsillectomy    . Refractive surgery      left eye/ with enhancement  . Cyst excision Left 06/23/2015    Procedure: EXCISION MASS LEFT INDEX  FINGER;  Surgeon: Daryll Brod, MD;  Location: [facility];  Service: Orthopedics;  Laterality: Left;  ANESTHESIA: IV REGIONAL/FAB   Social History  Substance Use Topics  . Smoking status: Never Smoker   . Smokeless tobacco: Never Used  . Alcohol Use: 4.2 oz/week    7 Shots of liquor per week   No Known Allergies Family History  Problem Relation Age of Onset  . Diabetes Mother   . Hypertension Mother   . Arthritis Mother   . Arthritis Father   . Heart disease Father   . Cancer Neg Hx   . Stroke Neg Hx   . Kidney disease Neg Hx   . Early death Neg Hx   . Diabetes Sister     Past medical history, social, surgical and family history all reviewed in electronic medical record.   Review of Systems: No headache, visual changes, nausea, vomiting, diarrhea, constipation, dizziness, abdominal pain, skin rash, fevers, chills, night sweats, weight loss, swollen lymph nodes, body aches, joint swelling, muscle aches, chest pain, shortness of breath, mood changes.   Objective Blood pressure 128/84, pulse 69, height 5\' 9"  (1.753 m), weight 211 lb (95.709 kg), SpO2 97 %.  General: No apparent distress alert and oriented x3 mood and affect normal, dressed appropriately.  HEENT: Pupils equal, extraocular movements intact  Respiratory: Patient's speak in full sentences and does not appear short of breath  Cardiovascular: No lower extremity edema, non  tender, no erythema  Skin: Warm dry intact with no signs of infection or rash on extremities or on axial skeleton.  Abdomen: Soft nontender  Neuro: Cranial nerves II through XII are intact, neurovascularly intact in all extremities with 2+ DTRs and 2+ pulses.  Lymph: No lymphadenopathy of posterior or anterior cervical chain or axillae bilaterally.  Gait normal with good balance and coordination.  MSK:  Non tender with full range of motion and good stability and symmetric strength and tone of  elbows, wrist, hip, and ankles bilaterally.    Knee: Right Trace effusion noted ROM full in flexion and extension and lower leg rotation. Ligaments with solid consistent endpoints including ACL, PCL, LCL, MCL. Positive Mcmurray's, Apley's, and Thessalonian test still present. Non painful patellar compression. Patellar glide without crepitus. Patellar and quadriceps tendons unremarkable. Hamstring and quadriceps strength is normal.  Contralateral knee unremarkable.  Shoulder: left Inspection reveals no abnormalities, atrophy or asymmetry. Palpation is normal with no tenderness over AC joint or bicipital groove. ROM is full in all planes passively. Rotator cuff strength normal throughout. signs of impingement with positive Neer and Hawkin's tests, but negative empty can sign. Speeds and Yergason's tests normal.  labral pathology noted with positive Obrien's, negative clunk and good stability. Normal scapular function observed. No painful arc and no drop arm sign. No apprehension sign Contralateral shoulder unremarkable     Impression and Recommendations:     This case required medical decision making of moderate complexity.

## 2015-07-10 NOTE — Assessment & Plan Note (Signed)
Patient is 2 months out from the injection is having worsening symptoms again. I do believe that there is some mild patellofemoral as well as a meniscal tear. Patient given different treatment options and has elected to start with formal physical therapy. We discussed icing regimen. Discussed activities to avoid. We discussed possible bracing which patient declined. Per prescription for topical anti-inflammatories given.

## 2015-07-10 NOTE — Assessment & Plan Note (Signed)
Patient is no weakness of the rotator cuff but does have a positive O'Brien's. Labral tears within the differential. Patient though seems to still have good strength. At this time we discussed different treatment options and patient has elected to do formal physical therapy, and topical anti-inflammatories. Patient continued to have these polyarthralgia I do get labs to rule out any other systemic abnormality that can be contribute in. We discussed icing regimen. Patient will come back in 3-4 weeks. If worsening symptoms we'll consider ultrasound and likely injection.

## 2015-07-10 NOTE — Assessment & Plan Note (Signed)
Does have polyarthralgia. We discussed icing regimen and home exercises. We discussed which activities to which ones to avoid. Patient will back and see me again after the labs will discuss any significant changes need to be made.

## 2015-07-13 LAB — ANA: ANA: NEGATIVE

## 2015-07-13 LAB — CYCLIC CITRUL PEPTIDE ANTIBODY, IGG

## 2015-07-14 HISTORY — PX: CATARACT EXTRACTION: SUR2

## 2015-07-27 ENCOUNTER — Other Ambulatory Visit: Payer: Self-pay | Admitting: Neurology

## 2015-07-27 MED ORDER — AMITRIPTYLINE HCL 10 MG PO TABS: 10.0000 mg | ORAL_TABLET | Freq: Every day | ORAL | Status: DC

## 2015-07-27 NOTE — Telephone Encounter (Signed)
Amitriptyline refill requested. Per last office note- patient to remain on medication. Refill approved and sent to patient's pharmacy.   

## 2015-07-28 ENCOUNTER — Encounter: Payer: Self-pay | Admitting: Family Medicine

## 2015-07-28 ENCOUNTER — Emergency Department (HOSPITAL_BASED_OUTPATIENT_CLINIC_OR_DEPARTMENT_OTHER): Payer: BLUE CROSS/BLUE SHIELD

## 2015-07-28 ENCOUNTER — Emergency Department (HOSPITAL_BASED_OUTPATIENT_CLINIC_OR_DEPARTMENT_OTHER)
Admission: EM | Admit: 2015-07-28 | Discharge: 2015-07-28 | Disposition: A | Discharge status: Home / Self Care | Payer: BLUE CROSS/BLUE SHIELD | Attending: Emergency Medicine | Admitting: Emergency Medicine

## 2015-07-28 ENCOUNTER — Encounter (HOSPITAL_BASED_OUTPATIENT_CLINIC_OR_DEPARTMENT_OTHER): Payer: Self-pay

## 2015-07-28 ENCOUNTER — Ambulatory Visit (INDEPENDENT_AMBULATORY_CARE_PROVIDER_SITE_OTHER): Payer: BLUE CROSS/BLUE SHIELD | Admitting: Family Medicine

## 2015-07-28 ENCOUNTER — Emergency Department (HOSPITAL_COMMUNITY)
Admission: EM | Admit: 2015-07-28 | Discharge: 2015-07-28 | Disposition: A | Discharge status: Home / Self Care | Payer: BLUE CROSS/BLUE SHIELD | Source: Home / Self Care

## 2015-07-28 VITALS — BP 112/80 | HR 95 | Temp 98.4°F | Ht 69.0 in | Wt 209.7 lb

## 2015-07-28 DIAGNOSIS — K5732 Diverticulitis of large intestine without perforation or abscess without bleeding: Secondary | ICD-10-CM

## 2015-07-28 DIAGNOSIS — R1030 Lower abdominal pain, unspecified: Secondary | ICD-10-CM

## 2015-07-28 DIAGNOSIS — R1031 Right lower quadrant pain: Secondary | ICD-10-CM | POA: Diagnosis present

## 2015-07-28 DIAGNOSIS — Z79899 Other long term (current) drug therapy: Secondary | ICD-10-CM | POA: Diagnosis not present

## 2015-07-28 DIAGNOSIS — F419 Anxiety disorder, unspecified: Secondary | ICD-10-CM | POA: Diagnosis not present

## 2015-07-28 DIAGNOSIS — Z872 Personal history of diseases of the skin and subcutaneous tissue: Secondary | ICD-10-CM | POA: Insufficient documentation

## 2015-07-28 DIAGNOSIS — Z8739 Personal history of other diseases of the musculoskeletal system and connective tissue: Secondary | ICD-10-CM | POA: Diagnosis not present

## 2015-07-28 LAB — COMPREHENSIVE METABOLIC PANEL
ALT: 14 U/L — AB (ref 17–63)
AST: 16 U/L (ref 15–41)
Albumin: 4 g/dL (ref 3.5–5.0)
Alkaline Phosphatase: 61 U/L (ref 38–126)
Anion gap: 8 (ref 5–15)
BUN: 13 mg/dL (ref 6–20)
CHLORIDE: 105 mmol/L (ref 101–111)
CO2: 25 mmol/L (ref 22–32)
CREATININE: 1 mg/dL (ref 0.61–1.24)
Calcium: 8.6 mg/dL — ABNORMAL LOW (ref 8.9–10.3)
Glucose, Bld: 123 mg/dL — ABNORMAL HIGH (ref 65–99)
POTASSIUM: 4.4 mmol/L (ref 3.5–5.1)
SODIUM: 138 mmol/L (ref 135–145)
Total Bilirubin: 1 mg/dL (ref 0.3–1.2)
Total Protein: 7.4 g/dL (ref 6.5–8.1)

## 2015-07-28 LAB — URINALYSIS, ROUTINE W REFLEX MICROSCOPIC
Bilirubin Urine: NEGATIVE
Glucose, UA: NEGATIVE mg/dL
Hgb urine dipstick: NEGATIVE
Ketones, ur: NEGATIVE mg/dL
LEUKOCYTES UA: NEGATIVE
Nitrite: NEGATIVE
PROTEIN: NEGATIVE mg/dL
SPECIFIC GRAVITY, URINE: 1.022 (ref 1.005–1.030)
pH: 5.5 (ref 5.0–8.0)

## 2015-07-28 LAB — CBC
HCT: 45.7 % (ref 39.0–52.0)
Hemoglobin: 15.4 g/dL (ref 13.0–17.0)
MCH: 30.3 pg (ref 26.0–34.0)
MCHC: 33.7 g/dL (ref 30.0–36.0)
MCV: 90 fL (ref 78.0–100.0)
PLATELETS: 238 10*3/uL (ref 150–400)
RBC: 5.08 MIL/uL (ref 4.22–5.81)
RDW: 13.5 % (ref 11.5–15.5)
WBC: 12 10*3/uL — AB (ref 4.0–10.5)

## 2015-07-28 LAB — LIPASE, BLOOD: LIPASE: 33 U/L (ref 11–51)

## 2015-07-28 MED ORDER — IOHEXOL 300 MG/ML  SOLN
50.0000 mL | Freq: Once | INTRAMUSCULAR | Status: AC | PRN
  Administered 2015-07-28: 50 mL via ORAL

## 2015-07-28 MED ORDER — METRONIDAZOLE 500 MG PO TABS: 500.0000 mg | ORAL_TABLET | Freq: Three times a day (TID) | ORAL | Status: DC

## 2015-07-28 MED ORDER — CIPROFLOXACIN HCL 500 MG PO TABS: 500.0000 mg | ORAL_TABLET | Freq: Two times a day (BID) | ORAL | Status: DC

## 2015-07-28 MED ORDER — IOHEXOL 300 MG/ML  SOLN
100.0000 mL | Freq: Once | INTRAMUSCULAR | Status: AC | PRN
  Administered 2015-07-28: 100 mL via INTRAVENOUS

## 2015-07-28 NOTE — Progress Notes (Signed)
Pre visit review using our clinic review tool, if applicable. No additional management support is needed unless otherwise documented below in the visit note. 

## 2015-07-28 NOTE — Progress Notes (Signed)
HPI:  Daniel [name] is a pleasant 65 yo here for an acute visit for severe abdominal pain. 2 days, but seems to be worsening currently. Reports severe pain in the lower abdomen, worse with coughing and certain movements. The pain is constant. A little better with anti-inflammatory medication. He feels like his abdomen has been swelling as well. Denies fevers, nausea, vomiting, constipation, diarrhea, melena, hematochezia, dysuria or hematuria. He has a history of diverticulosis and a hernia repair. No recent sick contacts. He was on antibiotics for sinus infection about 2 weeks ago. He was on antibiotics for a sinus infection about 2 weeks ago.  ROS: See pertinent positives and negatives per HPI.  Past Medical History  Diagnosis Date  . Acne   . Low back pain radiating to both legs   . Diverticulosis of colon (without mention of hemorrhage)     colo 09/2010  . DDD (degenerative disc disease), lumbosacral     chronic back pain  . Anxiety     Past Surgical History  Procedure Laterality Date  . Hernia repair    . Tonsillectomy    . Refractive surgery      left eye/ with enhancement  . Cyst excision Left 06/23/2015    Procedure: EXCISION MASS LEFT INDEX FINGER;  Surgeon: Daryll Brod, MD;  Location: [facility];  Service: Orthopedics;  Laterality: Left;  ANESTHESIA: IV REGIONAL/FAB  . Cataract extraction Left 07/14/2015    Family History  Problem Relation Age of Onset  . Diabetes Mother   . Hypertension Mother   . Arthritis Mother   . Arthritis Father   . Heart disease Father   . Cancer Neg Hx   . Stroke Neg Hx   . Kidney disease Neg Hx   . Early death Neg Hx   . Diabetes Sister     Social History   Social History  . Marital Status: Single    Spouse Name: N/A  . Number of Children: N/A  . Years of Education: N/A   Social History Main Topics  . Smoking status: Never Smoker   . Smokeless tobacco: Never Used  . Alcohol Use: 4.2 oz/week    7 Shots of liquor per week   . Drug Use: No  . Sexual Activity: No   Other Topics Concern  . None   Social History Narrative   Caffienated beverages-Yes   Seat belts often-Yes   Smoke alarm in home-Yes   Firearms/guns in home-Yes   History of physical abuse-NO   HLE- Masters degree     Current outpatient prescriptions:  .  amitriptyline (ELAVIL) 10 MG tablet, Take 1 tablet (10 mg total) by mouth at bedtime., Disp: 90 tablet, Rfl: 1 .  BESIVANCE 0.6 % SUSP, INSTILL 1 DROP IN LEFT EYE THREE TIMES A DAY, Disp: , Rfl: 1 .  Bimatoprost (LUMIGAN OP), Apply to eye., Disp: , Rfl:  .  Brimonidine Tartrate-Timolol (COMBIGAN OP), Apply to eye., Disp: , Rfl:  .  Bromfenac Sodium (PROLENSA OP), Apply to eye., Disp: , Rfl:  .  cetirizine (ZYRTEC) 10 MG chewable tablet, Chew 10 mg by mouth daily., Disp: , Rfl:  .  Diclofenac Sodium (PENNSAID) 2 % SOLN, Place 2 application onto the skin 2 (two) times daily., Disp: 112 g, Rfl: 3 .  Diclofenac Sodium 2 % SOLN, Apply 1 pump twice daily., Disp: 112 g, Rfl: 3 .  DOXYCYCLINE, ROSACEA, PO, Take by mouth as needed. , Disp: , Rfl:  .  hydrOXYzine (ATARAX/VISTARIL) 10  MG tablet, Take 1 tablet (10 mg total) by mouth 3 (three) times daily as needed., Disp: 90 tablet, Rfl: 0 .  Multiple Vitamins-Minerals (MULTIVITAMIN PO), Take by mouth., Disp: , Rfl:   EXAM:  Filed Vitals:   07/28/15 1416  BP: 112/80  Pulse: 95  Temp: 98.4 F (36.9 C)    Body mass index is 30.95 kg/(m^2).  GENERAL: vitals reviewed and listed above, alert, oriented, appears well hydrated and in no acute distress  HEENT: atraumatic, conjunttiva clear, no obvious abnormalities on inspection of external nose and ears  NECK: no obvious masses on inspection  LUNGS: clear to auscultation bilaterally, no wheezes, rales or rhonchi, good air movement  CV: HRRR, no peripheral edema  ABD: Bowel sounds hypoactive, exquisite tenderness even to the lightest palpation in all 4 quadrants, significantly worse in the  lower abdomen, some guarding and rebound.  MS: moves all extremities without noticeable abnormality  PSYCH: pleasant and cooperative, no obvious depression or anxiety  ASSESSMENT AND PLAN:  Discussed the following assessment and plan:  Lower abdominal pain  I'm concerned with the severity of his symptoms and the degree of tenderness on exam. We discussed various potential causes both benign and serious. He needs prompt lab work and a CT scan. He opted for evaluation in the emergency room given the severity of his symptoms and exam findings. Have advised my assistant to notify the emergency room. He feels he is able to drive himself. Vital signs are currently normal. -Patient advised to return or notify a doctor immediately if symptoms worsen or persist or new concerns arise.  There are no Patient Instructions on file for this visit.   Colin Benton R.

## 2015-07-28 NOTE — ED Notes (Signed)
PA-C at bedside 

## 2015-07-28 NOTE — ED Notes (Signed)
C/o abd pain since Sunday-denies n/v/d-sent from PCP-no tests done

## 2015-07-28 NOTE — Discharge Instructions (Signed)
Please read and follow all provided instructions.  Your diagnoses today include:  1. Diverticulitis of large intestine without perforation or abscess without bleeding    Tests performed today include:  Blood counts and electrolytes  Blood tests to check liver and kidney function  Blood tests to check pancreas function  Urine test to look for infection and pregnancy (in women)  Vital signs. See below for your results today.   Medications prescribed:   Take any prescribed medications only as directed.  Home care instructions:   Follow any educational materials contained in this packet.  Follow-up instructions: Please follow-up with your primary care provider in the next week for further evaluation of your symptoms.    Return instructions:  SEEK IMMEDIATE MEDICAL ATTENTION IF:  The pain does not go away or becomes severe   A temperature above 101F develops   Repeated vomiting occurs (multiple episodes)   The pain becomes localized to portions of the abdomen. The right side could possibly be appendicitis. In an adult, the left lower portion of the abdomen could be colitis or diverticulitis.   Blood is being passed in stools or vomit (bright red or black tarry stools)   You develop chest pain, difficulty breathing, dizziness or fainting, or become confused, poorly responsive, or inconsolable (young children)  If you have any other emergent concerns regarding your health  Additional Information: Abdominal (belly) pain can be caused by many things. Your caregiver performed an examination and possibly ordered blood/urine tests and imaging (CT scan, x-rays, ultrasound). Many cases can be observed and treated at home after initial evaluation in the emergency department. Even though you are being discharged home, abdominal pain can be unpredictable. Therefore, you need a repeated exam if your pain does not resolve, returns, or worsens. Most patients with abdominal pain don't have  to be admitted to the hospital or have surgery, but serious problems like appendicitis and gallbladder attacks can start out as nonspecific pain. Many abdominal conditions cannot be diagnosed in one visit, so follow-up evaluations are very important.  Your vital signs today were: BP 126/90 mmHg   Pulse 72   Temp(Src) 98.5 F (36.9 C) (Oral)   Resp 18   Ht 5\' 9"  (1.753 m)   Wt 94.802 kg   BMI 30.85 kg/m2   SpO2 99% If your blood pressure (bp) was elevated above 135/85 this visit, please have this repeated by your doctor within one month. --------------

## 2015-07-28 NOTE — ED Notes (Signed)
Complains of abd pain x 2 days, denies N/V/D, poor appetite

## 2015-07-28 NOTE — ED Notes (Signed)
DC instructions reviewed with pt along with the two (2) Rx as written by EDP. Also reinforced importance of making follow MD appointment as recommended by ED provider. Opportunity for question provided

## 2015-07-28 NOTE — ED Provider Notes (Signed)
CSN: [id number]     Arrival date & time 07/28/15  1529 History   First MD Initiated Contact with Patient 07/28/15 1601     Chief Complaint  Patient presents with  . Abdominal Pain   (Consider location/radiation/quality/duration/timing/severity/associated sxs/prior Treatment) HPI 65 y.o. male with a hx of Diverticulitis, presents to the Emergency Department today complaining of RLQ and LLQ abdominal pain since Sunday evening. States the pain is sharp in nature and come and goes. Notes that he feels swollen in those areas. States that the pain is normally a 3/10, but when moving or touching the area, it is a 8/10. No N/V/D. No fevers. Notes diaphoretic last night with chills. No CP/SOB. Hx diverticulitis several years ago. Having regular BMs. No blood in stool. No dysuria.   Past Medical History  Diagnosis Date  . Acne   . Low back pain radiating to both legs   . Diverticulosis of colon (without mention of hemorrhage)     colo 09/2010  . DDD (degenerative disc disease), lumbosacral     chronic back pain  . Anxiety    Past Surgical History  Procedure Laterality Date  . Hernia repair    . Tonsillectomy    . Refractive surgery      left eye/ with enhancement  . Cyst excision Left 06/23/2015    Procedure: EXCISION MASS LEFT INDEX FINGER;  Surgeon: Daryll Brod, MD;  Location: [facility];  Service: Orthopedics;  Laterality: Left;  ANESTHESIA: IV REGIONAL/FAB  . Cataract extraction Left 07/14/2015   Family History  Problem Relation Age of Onset  . Diabetes Mother   . Hypertension Mother   . Arthritis Mother   . Arthritis Father   . Heart disease Father   . Cancer Neg Hx   . Stroke Neg Hx   . Kidney disease Neg Hx   . Early death Neg Hx   . Diabetes Sister    Social History  Substance Use Topics  . Smoking status: Never Smoker   . Smokeless tobacco: Never Used  . Alcohol Use: Yes     Comment: weekly    Review of Systems ROS reviewed and all are negative for acute  change except as noted in the HPI.  Allergies  Durezol  Home Medications   Prior to Admission medications   Medication Sig Start Date End Date Taking? Authorizing Provider  amitriptyline (ELAVIL) 10 MG tablet Take 1 tablet (10 mg total) by mouth at bedtime. 07/27/15   Pieter Partridge, DO  BESIVANCE 0.6 % SUSP INSTILL 1 DROP IN LEFT EYE THREE TIMES A DAY 07/15/15   Historical Provider, MD  Bimatoprost (LUMIGAN OP) Apply to eye.    Historical Provider, MD  Brimonidine Tartrate-Timolol (COMBIGAN OP) Apply to eye.    Historical Provider, MD  Bromfenac Sodium (PROLENSA OP) Apply to eye.    Historical Provider, MD  cetirizine (ZYRTEC) 10 MG chewable tablet Chew 10 mg by mouth daily.    Historical Provider, MD  Diclofenac Sodium (PENNSAID) 2 % SOLN Place 2 application onto the skin 2 (two) times daily. 07/10/15   Lyndal Pulley, DO  Diclofenac Sodium 2 % SOLN Apply 1 pump twice daily. 07/10/15   Lyndal Pulley, DO  DOXYCYCLINE, ROSACEA, PO Take by mouth as needed.     Historical Provider, MD  hydrOXYzine (ATARAX/VISTARIL) 10 MG tablet Take 1 tablet (10 mg total) by mouth 3 (three) times daily as needed. 05/06/15   Lyndal Pulley, DO  Multiple Vitamins-Minerals (  MULTIVITAMIN PO) Take by mouth.    Historical Provider, MD   BP 149/97 mmHg  Pulse 88  Temp(Src) 98.5 F (36.9 C) (Oral)  Resp 16  Ht 5\' 9"  (1.753 m)  Wt 94.802 kg  BMI 30.85 kg/m2  SpO2 98%   Physical Exam  Constitutional: He is oriented to person, place, and time. He appears well-developed and well-nourished.  HENT:  Head: Normocephalic and atraumatic.  Eyes: EOM are normal. Pupils are equal, round, and reactive to light.  Neck: Normal range of motion. Neck supple.  Cardiovascular: Normal rate, regular rhythm and normal heart sounds.   Pulmonary/Chest: Effort normal and breath sounds normal. No respiratory distress. He has no wheezes. He has no rales.  Abdominal: Soft. Normal appearance and bowel sounds are normal. There is  tenderness in the right lower quadrant, suprapubic area and left lower quadrant. There is guarding and tenderness at McBurney's point. There is no rigidity, no CVA tenderness and negative Murphy's sign.  Very TTP of RLQ + LLQ with light touch. Pos peritoneal sign.   Musculoskeletal: Normal range of motion.  Neurological: He is alert and oriented to person, place, and time.  Skin: Skin is warm and dry.  Psychiatric: He has a normal mood and affect. His behavior is normal. Thought content normal.  Nursing note and vitals reviewed.   ED Course  Procedures (including critical care time) Labs Review Labs Reviewed  CBC - Abnormal; Notable for the following:    WBC 12.0 (*)    All other components within normal limits  COMPREHENSIVE METABOLIC PANEL - Abnormal; Notable for the following:    Glucose, Bld 123 (*)    Calcium 8.6 (*)    ALT 14 (*)    All other components within normal limits  URINALYSIS, ROUTINE W REFLEX MICROSCOPIC (NOT AT [facility]) - Abnormal; Notable for the following:    Color, Urine AMBER (*)    All other components within normal limits  LIPASE, BLOOD   Imaging Review Ct Abdomen Pelvis W Contrast  07/28/2015  CLINICAL DATA:  Lower abdominal pain and nausea for 2 days. Personal history of diverticulitis. EXAM: CT ABDOMEN AND PELVIS WITH CONTRAST TECHNIQUE: Multidetector CT imaging of the abdomen and pelvis was performed using the standard protocol following bolus administration of intravenous contrast. CONTRAST:  132mL OMNIPAQUE IOHEXOL 300 MG/ML  SOLN COMPARISON:  01/01/2011 FINDINGS: Lower chest:  No acute findings. Hepatobiliary: No masses or other significant abnormality. Gallbladder is unremarkable. Pancreas: No mass, inflammatory changes, or other significant abnormality. Spleen: Within normal limits in size and appearance. Adrenals/Urinary Tract: No masses identified. No evidence of hydronephrosis. Stomach/Bowel: No evidence of obstruction, inflammatory process, or abnormal  fluid collections. Small proximal duodenal diverticulum incidentally noted. Normal appendix visualized. Moderate diverticulitis is seen involving the mid sigmoid colon. There is no evidence of abscess or other complication. Vascular/Lymphatic: No pathologically enlarged lymph nodes. No evidence of abdominal aortic aneurysm. Reproductive: No mass or other significant abnormality. Other: None. Musculoskeletal:  No suspicious bone lesions identified. IMPRESSION: Moderate diverticulitis involving the mid sigmoid colon. No evidence of abscess or other complication. Electronically Signed   By: Earle Gell M.D.   On: 07/28/2015 18:17   I have personally reviewed and evaluated these images and lab results as part of my medical decision-making.   EKG Interpretation None      MDM  I have reviewed and evaluated the relevant laboratory values.I have reviewed and evaluated the relevant imaging studies.I personally evaluated and interpreted the relevant EKG.I have reviewed  the relevant previous healthcare records.I have reviewed EMS Documentation.I obtained HPI from historian. Patient discussed with supervising physician  ED Course:  Assessment: 33y M with hx diverticulitis presents with LLQ and RLQ abd pain since Sunday. On exam, pt in NAD. Sitting comfortably. VSS. Afebrile. Lungs CTA. Heart RRR. Abdomen TTP RLQ + LLQ. Pain minimal at rest. Worse with palpation. Worrisome for Appendicitis vs Diverticulitis. Labs show minimal leukocystosis. UA neg. CT Abd/pelvis showed mod diverticulitis. Will treat with Cipro + Flagyl. Will DC patient with outpatient follow up. At time of discharge, Patient is in no acute distress. Vital Signs are stable. Patient is able to ambulate. Patient able to tolerate PO.     Disposition/Plan:  DC Home Additional Verbal discharge instructions given and discussed with patient.  Pt Instructed to f/u with PCP in the next week for evaluation and treatment of symptoms. Return precautions  given Pt acknowledges and agrees with plan  Supervising Physician Sharlett Iles, MD   Final diagnoses:  Diverticulitis of large intestine without perforation or abscess without bleeding       Shary Decamp, PA-C 07/28/15 [place], MD 07/31/15 [phone]

## 2015-08-05 ENCOUNTER — Ambulatory Visit (INDEPENDENT_AMBULATORY_CARE_PROVIDER_SITE_OTHER): Payer: BLUE CROSS/BLUE SHIELD | Admitting: Family Medicine

## 2015-08-05 ENCOUNTER — Encounter: Payer: Self-pay | Admitting: Family Medicine

## 2015-08-05 VITALS — BP 130/80 | HR 63 | Ht 69.0 in | Wt 210.0 lb

## 2015-08-05 DIAGNOSIS — S83206D Unspecified tear of unspecified meniscus, current injury, right knee, subsequent encounter: Secondary | ICD-10-CM | POA: Diagnosis not present

## 2015-08-05 DIAGNOSIS — M7552 Bursitis of left shoulder: Secondary | ICD-10-CM | POA: Diagnosis not present

## 2015-08-05 NOTE — Assessment & Plan Note (Signed)
Patient does have some shoulder bursitis and is working with formal physical therapy. Patient has declined any type of injection in the shoulder. I'm concerned the patient may also have some labral pathology. We may need to consider further workup if patient does not make any significant improvement. Does have a history of neck pain as well as degenerative disc disease. We may also need to consider advanced imaging of the neck if any of the weakness is from a neurologic

## 2015-08-05 NOTE — Progress Notes (Signed)
[name] [address] [place], [place] [address] Phone: [phone] Subjective:     CC: Right knee pain f/u Follow-up left shoulder pain [id number] Daniel [name] is a 65 y.o. male coming in with complaint of right knee pain..  Patient was found to have a significant knee effusion with a lateral meniscal tear. Patient is doing very well overall. Has started with formal physical therapy. Does still have some mild pain. Sometimes has a catching sensation but never gives out on him. States that the pain is very minimal at this time. Just more of a weakness. Denies any new symptoms.  Patient is also having left shoulder pain. Patient was found to have more rotator cuff tendinopathy will as well as possible labral pathology. Patient is also going to formal physical therapy. States that he has some mild weakness. Not stopping him from any activities but still does not feel normal. States that he has noticed may be some mild increase in weakness. No numbness.    Past Medical History  Diagnosis Date  . Acne   . Low back pain radiating to both legs   . Diverticulosis of colon (without mention of hemorrhage)     colo 09/2010  . DDD (degenerative disc disease), lumbosacral     chronic back pain  . Anxiety    Past Surgical History  Procedure Laterality Date  . Hernia repair    . Tonsillectomy    . Refractive surgery      left eye/ with enhancement  . Cyst excision Left 06/23/2015    Procedure: EXCISION MASS LEFT INDEX FINGER;  Surgeon: Daryll Brod, MD;  Location: [facility];  Service: Orthopedics;  Laterality: Left;  ANESTHESIA: IV REGIONAL/FAB  . Cataract extraction Left 07/14/2015   Social History  Substance Use Topics  . Smoking status: Never Smoker   . Smokeless tobacco: Never Used  . Alcohol Use: Yes     Comment: weekly   Allergies  Allergen Reactions  . Durezol [Difluprednate]     Eye drops-swelling   Family History  Problem  Relation Age of Onset  . Diabetes Mother   . Hypertension Mother   . Arthritis Mother   . Arthritis Father   . Heart disease Father   . Cancer Neg Hx   . Stroke Neg Hx   . Kidney disease Neg Hx   . Early death Neg Hx   . Diabetes Sister     Past medical history, social, surgical and family history all reviewed in electronic medical record.   Review of Systems: No headache, visual changes, nausea, vomiting, diarrhea, constipation, dizziness, abdominal pain, skin rash, fevers, chills, night sweats, weight loss, swollen lymph nodes, body aches, joint swelling, muscle aches, chest pain, shortness of breath, mood changes.   Objective Blood pressure 130/80, pulse 63, weight 210 lb (95.255 kg), SpO2 97 %.  General: No apparent distress alert and oriented x3 mood and affect normal, dressed appropriately.  HEENT: Pupils equal, extraocular movements intact  Respiratory: Patient's speak in full sentences and does not appear short of breath  Cardiovascular: No lower extremity edema, non tender, no erythema  Skin: Warm dry intact with no signs of infection or rash on extremities or on axial skeleton.  Abdomen: Soft nontender  Neuro: Cranial nerves II through XII are intact, neurovascularly intact in all extremities with 2+ DTRs and 2+ pulses.  Lymph: No lymphadenopathy of posterior or anterior cervical chain or axillae bilaterally.  Gait normal with good  balance and coordination.  MSK:  Non tender with full range of motion and good stability and symmetric strength and tone of  elbows, wrist, hip, and ankles bilaterally.   Neck: Inspection unremarkable. No palpable stepoffs. Negative Spurling's maneuver. Lacks last 5 of extension Grip strength and sensation normal in bilateral hands Strength good C4 to T1 distribution No sensory change to C4 to T1 Negative Hoffman sign bilaterally Reflexes normal  Knee: Right Trace effusion noted ROM full in flexion and extension and lower leg  rotation. Ligaments with solid consistent endpoints including ACL, PCL, LCL, MCL. Negative McMurray's which is an improvement Non painful patellar compression. Patellar glide without crepitus. Patellar and quadriceps tendons unremarkable. Hamstring and quadriceps strength is normal.  Contralateral knee unremarkable.  Shoulder: left Inspection reveals no abnormalities, atrophy or asymmetry. Palpation is normal with no tenderness over AC joint or bicipital groove. ROM is full in all planes passively. Rotator cuff strength 4 out of 5 compared to 5 out of 5 on the contralateral side signs of impingement with positive Neer and Hawkin's tests, but negative empty can sign. Speeds and Yergason's tests normal.  labral pathology noted with positive Obrien's, negative clunk and good stability. Normal scapular function observed. No painful arc and no drop arm sign. No apprehension sign Contralateral shoulder unremarkable     Impression and Recommendations:     This case required medical decision making of moderate complexity.

## 2015-08-05 NOTE — Assessment & Plan Note (Signed)
Patient is making progress. Has been 3 months since he injection. Is going to formal physical therapy. I do think that the patient has any internal derangement type symptoms advance imaging may be warranted. We can also repeat injection if necessary. Would like to see patient again in 6 weeks for further evaluation.

## 2015-08-05 NOTE — Patient Instructions (Signed)
Good to see you  Ice is your friend still  Go to PT either 1 or 2 times a week as much as you feel.  Stay active at this time.  We do need to monitor the strength in the shoulder. See me again in 6 weeks.

## 2015-08-05 NOTE — Progress Notes (Signed)
Pre visit review using our clinic review tool, if applicable. No additional management support is needed unless otherwise documented below in the visit note. 

## 2015-09-18 ENCOUNTER — Encounter: Payer: Self-pay | Admitting: Family Medicine

## 2015-09-18 ENCOUNTER — Ambulatory Visit (INDEPENDENT_AMBULATORY_CARE_PROVIDER_SITE_OTHER): Payer: BLUE CROSS/BLUE SHIELD | Admitting: Family Medicine

## 2015-09-18 VITALS — BP 124/82 | HR 57 | Ht 69.0 in | Wt 207.0 lb

## 2015-09-18 DIAGNOSIS — S83206D Unspecified tear of unspecified meniscus, current injury, right knee, subsequent encounter: Secondary | ICD-10-CM

## 2015-09-18 MED ORDER — HYDROXYZINE HCL 10 MG PO TABS: 10.0000 mg | ORAL_TABLET | Freq: Three times a day (TID) | ORAL | Status: DC | PRN

## 2015-09-18 NOTE — Patient Instructions (Signed)
God to see you  Injected knee again  I would wear the brace daily for the next week and then any day you will be on concrete a lot.  Ice 20 minutes 2 times daily. Usually after activity and before bed. Continue with therapy for now, you are making progress.  See me again in 6 weeks if anything is still hurting.  You are doing great overall.!

## 2015-09-18 NOTE — Assessment & Plan Note (Addendum)
Patient given injection today and tolerated the procedure well. We discussed continuing icing regimen as well as formal physical therapy. Think it has been beneficial. If patient has any locking or giving out on him we will need to consider further workup as well as potential surgical intervention.patient has mild to moderate arthritic changes. Patient continues to have trouble he could be a candidate for meniscectomy and advance imaging could be warranted.  Spent  25 minutes with patient face-to-face and had greater than 50% of counseling including as described above in assessment and plan.

## 2015-09-18 NOTE — Progress Notes (Signed)
[name], [place] [address] Phone: [phone] Subjective:     CC: Right knee pain f/u  [id number] [name] is a 65 y.o. male coming in with complaint of right knee pain..  Patient was found to have a significant knee effusion with a lateral meniscal tear. Was doing significantly better. Continues with formal physical therapy and is making progress. Patient states that mild increase in discomfort down unfortunately secondary to him having to work a lot more on his feet recently. Has had some increasing swelling again.  Patient is also having left shoulder pain. Patient was found to have more rotator cuff tendinopathy will as well as possible labral pathology. Overall doing well. Think he is making progress with formal physical therapy.    Past Medical History  Diagnosis Date  . Acne   . Low back pain radiating to both legs   . Diverticulosis of colon (without mention of hemorrhage)     colo 09/2010  . DDD (degenerative disc disease), lumbosacral     chronic back pain  . Anxiety    Past Surgical History  Procedure Laterality Date  . Hernia repair    . Tonsillectomy    . Refractive surgery      left eye/ with enhancement  . Cyst excision Left 06/23/2015    Procedure: EXCISION MASS LEFT INDEX FINGER;  Surgeon: Daryll Brod, MD;  Location: [facility];  Service: Orthopedics;  Laterality: Left;  ANESTHESIA: IV REGIONAL/FAB  . Cataract extraction Left 07/14/2015   Social History  Substance Use Topics  . Smoking status: Never Smoker   . Smokeless tobacco: Never Used  . Alcohol Use: Yes     Comment: weekly   Allergies  Allergen Reactions  . Durezol [Difluprednate]     Eye drops-swelling   Family History  Problem Relation Age of Onset  . Diabetes Mother   . Hypertension Mother   . Arthritis Mother   . Arthritis Father   . Heart disease Father   . Cancer Neg Hx   . Stroke Neg Hx   . Kidney disease  Neg Hx   . Early death Neg Hx   . Diabetes Sister     Past medical history, social, surgical and family history all reviewed in electronic medical record.   Review of Systems: No headache, visual changes, nausea, vomiting, diarrhea, constipation, dizziness, abdominal pain, skin rash, fevers, chills, night sweats, weight loss, swollen lymph nodes, body aches, joint swelling, muscle aches, chest pain, shortness of breath, mood changes.   Objective Blood pressure 124/82, pulse 57, height 5\' 9"  (1.753 m), weight 207 lb (93.895 kg), SpO2 97 %.  General: No apparent distress alert and oriented x3 mood and affect normal, dressed appropriately.  HEENT: Pupils equal, extraocular movements intact  Respiratory: Patient's speak in full sentences and does not appear short of breath  Cardiovascular: No lower extremity edema, non tender, no erythema  Skin: Warm dry intact with no signs of infection or rash on extremities or on axial skeleton.  Abdomen: Soft nontender  Neuro: Cranial nerves II through XII are intact, neurovascularly intact in all extremities with 2+ DTRs and 2+ pulses.  Lymph: No lymphadenopathy of posterior or anterior cervical chain or axillae bilaterally.  Gait normal with good balance and coordination.  MSK:  Non tender with full range of motion and good stability and symmetric strength and tone of  elbows, wrist, hip, and ankles bilaterally.   Neck: Inspection unremarkable.  No palpable stepoffs. Negative Spurling's maneuver. Improvement in range of motion Grip strength and sensation normal in bilateral hands Strength good C4 to T1 distribution No sensory change to C4 to T1 Negative Hoffman sign bilaterally Reflexes normal  Knee: Right Mild increase in effusion from previous exam ROM full in flexion and extension and lower leg rotation. Ligaments with solid consistent endpoints including ACL, PCL, LCL, MCL. Mild positiveMcMurray's which is worsening Non painful patellar  compression. Patellar glide without crepitus. Patellar and quadriceps tendons unremarkable. Hamstring and quadriceps strength is normal.  Contralateral knee unremarkable.  Shoulder: left Inspection reveals no abnormalities, atrophy or asymmetry. Palpation is normal with no tenderness over AC joint or bicipital groove. ROM is full in all planes passively. Rotator cuff strength is full which is improved from previous exam signs of impingement with positive Neer and Hawkin's tests, but negative empty can sign. Speeds and Yergason's tests normal.  labral pathology noted with positive Obrien's, negative clunk and good stability. Normal scapular function observed. No painful arc and no drop arm sign. No apprehension sign Contralateral shoulder unremarkable  After informed written and verbal consent, patient was seated on exam table. Right knee was prepped with alcohol swab and utilizing anterolateral approach, patient's right knee space was injected with 4:1  marcaine 0.5%: Kenalog 40mg /dL. Patient tolerated the procedure well without immediate complications.   Impression and Recommendations:     This case required medical decision making of moderate complexity.

## 2015-09-18 NOTE — Progress Notes (Signed)
Pre visit review using our clinic review tool, if applicable. No additional management support is needed unless otherwise documented below in the visit note. 

## 2015-11-03 ENCOUNTER — Ambulatory Visit (INDEPENDENT_AMBULATORY_CARE_PROVIDER_SITE_OTHER): Payer: BLUE CROSS/BLUE SHIELD | Admitting: Family Medicine

## 2015-11-03 ENCOUNTER — Encounter: Payer: Self-pay | Admitting: Family Medicine

## 2015-11-03 VITALS — BP 118/82 | HR 64 | Ht 69.0 in | Wt 205.0 lb

## 2015-11-03 DIAGNOSIS — S83206D Unspecified tear of unspecified meniscus, current injury, right knee, subsequent encounter: Secondary | ICD-10-CM | POA: Diagnosis not present

## 2015-11-03 NOTE — Progress Notes (Signed)
Pre visit review using our clinic review tool, if applicable. No additional management support is needed unless otherwise documented below in the visit note. 

## 2015-11-03 NOTE — Assessment & Plan Note (Signed)
Patient overall seems to be doing relatively well. We discussed continuing the conservative therapy. We did discuss the possibility of advanced imaging if any locking or internal derangement symptoms start to occur. Patient states that overall he feels like it is doing well at this time. We will continue to monitor. Follow-up again in 6 weeks.

## 2015-11-03 NOTE — Progress Notes (Signed)
[name] [address] [place], [place] [address] Phone: [phone] Subjective:     CC: Right knee pain f/u  [id number] Daniel [name] is a 65 y.o. male coming in with complaint of right knee pain..  Patient was found to have a significant knee effusion with a lateral meniscal tear. Patient did have recurrent swelling and did have an injection 6 weeks ago. States that he is doing very well. Now noticing some popping but no pain. No instability. Patient did twist the knee proximal wing 3 weeks ago that did give him some swelling but it seemed to dissipate after icing for approximate one week. Patient denies any radiation down the leg or any new symptoms.    Past Medical History  Diagnosis Date  . Acne   . Low back pain radiating to both legs   . Diverticulosis of colon (without mention of hemorrhage)     colo 09/2010  . DDD (degenerative disc disease), lumbosacral     chronic back pain  . Anxiety    Past Surgical History  Procedure Laterality Date  . Hernia repair    . Tonsillectomy    . Refractive surgery      left eye/ with enhancement  . Cyst excision Left 06/23/2015    Procedure: EXCISION MASS LEFT INDEX FINGER;  Surgeon: Daryll Brod, MD;  Location: [facility];  Service: Orthopedics;  Laterality: Left;  ANESTHESIA: IV REGIONAL/FAB  . Cataract extraction Left 07/14/2015   Social History  Substance Use Topics  . Smoking status: Never Smoker   . Smokeless tobacco: Never Used  . Alcohol Use: Yes     Comment: weekly   Allergies  Allergen Reactions  . Durezol [Difluprednate]     Eye drops-swelling   Family History  Problem Relation Age of Onset  . Diabetes Mother   . Hypertension Mother   . Arthritis Mother   . Arthritis Father   . Heart disease Father   . Cancer Neg Hx   . Stroke Neg Hx   . Kidney disease Neg Hx   . Early death Neg Hx   . Diabetes Sister     Past medical history, social, surgical and family  history all reviewed in electronic medical record.   Review of Systems: No headache, visual changes, nausea, vomiting, diarrhea, constipation, dizziness, abdominal pain, skin rash, fevers, chills, night sweats, weight loss, swollen lymph nodes, body aches, joint swelling, muscle aches, chest pain, shortness of breath, mood changes.   Objective Blood pressure 118/82, pulse 64, height 5\' 9"  (1.753 m), weight 205 lb (92.987 kg), SpO2 97 %.  General: No apparent distress alert and oriented x3 mood and affect normal, dressed appropriately.  HEENT: Pupils equal, extraocular movements intact  Respiratory: Patient's speak in full sentences and does not appear short of breath  Cardiovascular: No lower extremity edema, non tender, no erythema  Skin: Warm dry intact with no signs of infection or rash on extremities or on axial skeleton.  Abdomen: Soft nontender  Neuro: Cranial nerves II through XII are intact, neurovascularly intact in all extremities with 2+ DTRs and 2+ pulses.  Lymph: No lymphadenopathy of posterior or anterior cervical chain or axillae bilaterally.  Gait normal with good balance and coordination.  MSK:  Non tender with full range of motion and good stability and symmetric strength and tone of  elbows, wrist, hip, and ankles bilaterally.   Neck: Inspection unremarkable. No palpable stepoffs. Negative Spurling's maneuver. Improvement in range of  motion Grip strength and sensation normal in bilateral hands Strength good C4 to T1 distribution No sensory change to C4 to T1 Negative Hoffman sign bilaterally Reflexes normal  Knee: Right No swelling noted ROM full in flexion and extension and lower leg rotation. Ligaments with solid consistent endpoints including ACL, PCL, LCL, MCL. Mild positiveMcMurray's  Non painful patellar compression. Patellar glide without crepitus. Patellar and quadriceps tendons unremarkable. Hamstring and quadriceps strength is normal.  Contralateral  knee unremarkable. Improvement from last exam   Shoulder: left Inspection reveals no abnormalities, atrophy or asymmetry. Palpation is normal with no tenderness over AC joint or bicipital groove. ROM is full in all planes passively. Rotator cuff strength is full Minimal signs of impingement Speeds and Yergason's tests normal.  labral pathology noted with positive Obrien's, negative clunk and good stability. Normal scapular function observed. No painful arc and no drop arm sign. No apprehension sign Contralateral shoulder unremarkable     Impression and Recommendations:     This case required medical decision making of moderate complexity.

## 2015-11-03 NOTE — Patient Instructions (Signed)
Good to see you Exercises 2-3 times a week.  Ice when swelling If worsening give me a call Otherwise see me in 6 weeks if we need to readdress

## 2015-11-09 ENCOUNTER — Ambulatory Visit (INDEPENDENT_AMBULATORY_CARE_PROVIDER_SITE_OTHER): Payer: BLUE CROSS/BLUE SHIELD | Admitting: Neurology

## 2015-11-09 ENCOUNTER — Encounter: Payer: Self-pay | Admitting: Neurology

## 2015-11-09 VITALS — BP 128/72 | HR 67 | Ht 69.0 in | Wt 208.0 lb

## 2015-11-09 DIAGNOSIS — G44219 Episodic tension-type headache, not intractable: Secondary | ICD-10-CM | POA: Diagnosis not present

## 2015-11-09 MED ORDER — AMITRIPTYLINE HCL 10 MG PO TABS: 10.0000 mg | ORAL_TABLET | Freq: Every day | ORAL | Status: DC

## 2015-11-09 NOTE — Progress Notes (Signed)
NEUROLOGY FOLLOW UP OFFICE NOTE  Daniel [name] [id number]  HISTORY OF PRESENT ILLNESS: Daniel [name] is a 65 year old right-handed man with no significant past medical history who follows up for tension type headache.  UPDATE: He takes amitriptyline 10mg  daily.  He was prescribed tizanidine 2mg  for neck tension.  He is also treated by Dr. Tamala Julian with OMT.   He is doing well.  He gets a headache about once every 6 weeks.  It is 4/10 intensity and resolves in 45 to 60 minutes with Tylenol.  HISTORY: In July, he was cutting the grass and hit the left side of his scalp on a tree branch.  He walked about another 40 feet before he realized something was wrong.  He noted blood running down his face.  He didn't quite feel right.  The next day, he still didn't feel well and noted neck pain, so he went to urgent care, where an Xray of the cervical spine was reported unremarkable.  He really doesn't remember those first two days very well.  He developed headaches, described as bi-temporal shooting pain, as well as soreness on the back of his neck with a numb feeling on his occiput.  He also has had difficulty focusing.  He has difficulty processing information and cannot follow conversations.  He also has been experiencing insomnia.  He has difficulty falling asleep.  When he finally falls asleep, he sleeps for 9 or 10 hours. He saw his PCP, Dr. Ronnald Ramp.  CT of the head without contrast was performed on 01/06/14, which was unremarkable with no evidence of hemorrhage or contusions.  For the headaches, he was initially taking Aleve daily up until mid August.  He was advised to stop due to potential liver toxicity and he has since been taking Tylenol twice a day.  The Tylenol doesn't work as well as the [place].  He also went to a chiropractor for a month, which has helped with the neck and head pain.  He has noticed improvement in cognition and people have told him he seems more lucid.  He still has symptoms and is not  back to baseline, however.  He still has been able to go to work.  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  For insomnia, he takes melatonin 2.5mg , which makes him very tired during the day.  He feels fatigued but not depressed.  PAST MEDICAL HISTORY: Past Medical History  Diagnosis Date  . Acne   . Low back pain radiating to both legs   . Diverticulosis of colon (without mention of hemorrhage)     colo 09/2010  . DDD (degenerative disc disease), lumbosacral     chronic back pain  . Anxiety     MEDICATIONS: Current Outpatient Prescriptions on File Prior to Visit  Medication Sig Dispense Refill  . cetirizine (ZYRTEC) 10 MG chewable tablet Chew 10 mg by mouth daily.    . Diclofenac Sodium (PENNSAID) 2 % SOLN Place 2 application onto the skin 2 (two) times daily. 112 g 3  . Diclofenac Sodium 2 % SOLN Apply 1 pump twice daily. 112 g 3  . doxycycline (VIBRAMYCIN) 50 MG capsule   1  . hydrOXYzine (ATARAX/VISTARIL) 10 MG tablet Take 1 tablet (10 mg total) by mouth 3 (three) times daily as needed. 90 tablet 3  . Multiple Vitamins-Minerals (MULTIVITAMIN PO) Take by mouth.     No current facility-administered medications on file prior to visit.    ALLERGIES: Allergies  Allergen Reactions  .  Durezol [Difluprednate]     Eye drops-swelling    FAMILY HISTORY: Family History  Problem Relation Age of Onset  . Diabetes Mother   . Hypertension Mother   . Arthritis Mother   . Arthritis Father   . Heart disease Father   . Cancer Neg Hx   . Stroke Neg Hx   . Kidney disease Neg Hx   . Early death Neg Hx   . Diabetes Sister     SOCIAL HISTORY: Social History   Social History  . Marital Status: Single    Spouse Name: N/A  . Number of Children: N/A  . Years of Education: N/A   Occupational History  . Not on file.   Social History Main Topics  . Smoking status: Never Smoker   . Smokeless tobacco: Never Used  . Alcohol Use: Yes     Comment: weekly  . Drug Use: No  . Sexual Activity: Not  on file   Other Topics Concern  . Not on file   Social History Narrative   Caffienated beverages-Yes   Seat belts often-Yes   Smoke alarm in home-Yes   Firearms/guns in home-Yes   History of physical abuse-NO   HLE- Masters degree    REVIEW OF SYSTEMS: Constitutional: No fevers, chills, or sweats, no generalized fatigue, change in appetite Eyes: No visual changes, double vision, eye pain Ear, nose and throat: No hearing loss, ear pain, nasal congestion, sore throat Cardiovascular: No chest pain, palpitations Respiratory:  No shortness of breath at rest or with exertion, wheezes GastrointestinaI: No nausea, vomiting, diarrhea, abdominal pain, fecal incontinence Genitourinary:  No dysuria, urinary retention or frequency Musculoskeletal:  No neck pain, back pain Integumentary: No rash, pruritus, skin lesions Neurological: as above Psychiatric: No depression, insomnia, anxiety Endocrine: No palpitations, fatigue, diaphoresis, mood swings, change in appetite, change in weight, increased thirst Hematologic/Lymphatic:  No purpura, petechiae. Allergic/Immunologic: no itchy/runny eyes, nasal congestion, recent allergic reactions, rashes  PHYSICAL EXAM: Filed Vitals:   11/09/15 1301  BP: 128/72  Pulse: 67   General: No acute distress.  Patient appears well-groomed.  normal body habitus. Head:  Normocephalic/atraumatic Eyes:  Fundi examined but not visualized Neck: supple, no paraspinal tenderness, full range of motion Heart:  Regular rate and rhythm Lungs:  Clear to auscultation bilaterally Back: No paraspinal tenderness Neurological Exam: alert and oriented to person, place, and time. Attention span and concentration intact, recent and remote memory intact, fund of knowledge intact.  Speech fluent and not dysarthric, language intact.  CN II-XII intact. Bulk and tone normal, muscle strength 5/5 throughout.  Sensation to light touch, temperature and vibration intact.  Deep tendon  reflexes 2+ throughout, toes downgoing.  Finger to nose and heel to shin testing intact.  Gait normal, Romberg negative.  IMPRESSION: Episodic tension-type headache,   PLAN: Amitriptyline 10mg  at bedtime OMM with Dr. Tamala Julian Follow up in 9 months.   15 minutes spent face to face with patient, over 50% spent discussing management.  Metta Clines, DO  CC:  Scarlette Calico, MD

## 2015-11-09 NOTE — Patient Instructions (Signed)
1.  Continue amitriptyline 10mg  at bedtime 2.  Follow up in 9 months.

## 2015-12-01 ENCOUNTER — Telehealth: Payer: Self-pay | Admitting: Neurology

## 2015-12-01 MED ORDER — METHYLPREDNISOLONE 4 MG PO TBPK: ORAL_TABLET | ORAL | Status: DC

## 2015-12-01 MED ORDER — TIZANIDINE HCL 2 MG PO CAPS: 2.0000 mg | ORAL_CAPSULE | Freq: Three times a day (TID) | ORAL | Status: DC | PRN

## 2015-12-01 NOTE — Telephone Encounter (Signed)
Left message on vm for pt to return call.

## 2015-12-01 NOTE — Telephone Encounter (Signed)
Daniel [name] 12/25/2050. He is having new problems since he was seen in June. He is having pain in the base of his skull on the right side and it is pain full to touch. He said his neck is also stiff. He would like to come in earlier that September. I did add him to the wait list. His number is [phone]. Thank you

## 2015-12-01 NOTE — Telephone Encounter (Signed)
Spoke with patient. Pain has been going on for about 1.5 weeks. It is on the right hand side at the base of his skull, where his skull and neck meet. He is also experiencing a stiff neck. Pt stated that he is also now experiencing headaches when he gets stressed out, which was not happening before. Pt has not seen PCP about this issue.  Pt is heading out of town tomorrow on a flight. Please advise.

## 2015-12-01 NOTE — Telephone Encounter (Signed)
Probably coming from the neck.  We can prescribe a Medrol Dosepak (take as directed) to help reduce inflammation.  I would continue using the tizanidine which we had prescribed in the past (2mg  every 8 hours as needed.  If 2mg  didn't help, he can take 4mg , but caution for drowsiness).

## 2015-12-01 NOTE — Telephone Encounter (Signed)
Message relayed to patient. Verbalized understanding and denied questions. RX's sent to pharmacy. Pt will call with update when he returns from his trip.

## 2015-12-14 ENCOUNTER — Encounter: Payer: Self-pay | Admitting: Neurology

## 2015-12-14 ENCOUNTER — Ambulatory Visit (INDEPENDENT_AMBULATORY_CARE_PROVIDER_SITE_OTHER): Payer: BLUE CROSS/BLUE SHIELD | Admitting: Neurology

## 2015-12-14 VITALS — HR 86 | Ht 69.0 in | Wt 208.0 lb

## 2015-12-14 DIAGNOSIS — R200 Anesthesia of skin: Secondary | ICD-10-CM

## 2015-12-14 DIAGNOSIS — M542 Cervicalgia: Secondary | ICD-10-CM | POA: Diagnosis not present

## 2015-12-14 DIAGNOSIS — R51 Headache: Secondary | ICD-10-CM

## 2015-12-14 DIAGNOSIS — R202 Paresthesia of skin: Secondary | ICD-10-CM | POA: Diagnosis not present

## 2015-12-14 DIAGNOSIS — G4486 Cervicogenic headache: Secondary | ICD-10-CM

## 2015-12-14 NOTE — Progress Notes (Signed)
[facility] [place],  [address] ph: [phone] fax: [phone]). Pt wishes to call to schedule himself. Contact info given. Will verify pt is scheduled at time of prior authorization.

## 2015-12-14 NOTE — Progress Notes (Addendum)
NEUROLOGY FOLLOW UP OFFICE NOTE  Daniel [name] [id number]  HISTORY OF PRESENT ILLNESS: Daniel [name] is a 65 year old right-handed man with no significant past medical history who follows up for a new type of posterior headache.  UPDATE: He takes amitriptyline 10mg  daily.  He was prescribed tizanidine 2mg  for neck tension.  He is also treated by Dr. Tamala Julian with OMT.     Since around the first of July, he has had right sided pain at the base of his skull with neck stiffness.  He denies any preceding event that triggered it.  It is in the right suboccipital region and radiates up the back of his head on the right.  It feels swollen.  He also has right paraspinal tenderness radiating into the trapezius.  Neck movement in all directions exacerbates it.  He denies radicular pain or weakness in the arm, but he reports occasional numbness and tingling in the right hand and forearm, noticeable when sitting in his chair at night.  Tizanidine helps for when the pain is more severe.  He was prescribed a Medrol Dosepak but didn't take it.  He cannot remember why.  He denies side effects or allergy to steroids.  He denies GI issues.  PAST MEDICAL HISTORY: Past Medical History:  Diagnosis Date  . Acne   . Anxiety   . DDD (degenerative disc disease), lumbosacral    chronic back pain  . Diverticulosis of colon (without mention of hemorrhage)    colo 09/2010  . Low back pain radiating to both legs     MEDICATIONS: Current Outpatient Prescriptions on File Prior to Visit  Medication Sig Dispense Refill  . amitriptyline (ELAVIL) 10 MG tablet Take 1 tablet (10 mg total) by mouth at bedtime. 90 tablet 3  . cetirizine (ZYRTEC) 10 MG chewable tablet Chew 10 mg by mouth daily.    . Diclofenac Sodium (PENNSAID) 2 % SOLN Place 2 application onto the skin 2 (two) times daily. 112 g 3  . doxycycline (VIBRAMYCIN) 50 MG capsule   1  . hydrOXYzine (ATARAX/VISTARIL) 10 MG tablet Take 1 tablet (10 mg total) by  mouth 3 (three) times daily as needed. 90 tablet 3  . Multiple Vitamins-Minerals (MULTIVITAMIN PO) Take by mouth.    . tizanidine (ZANAFLEX) 2 MG capsule Take 1 capsule (2 mg total) by mouth 3 (three) times daily as needed for muscle spasms. 90 capsule 0   No current facility-administered medications on file prior to visit.     ALLERGIES: Allergies  Allergen Reactions  . Durezol [Difluprednate]     Eye drops-swelling    FAMILY HISTORY: Family History  Problem Relation Age of Onset  . Diabetes Mother   . Hypertension Mother   . Arthritis Mother   . Arthritis Father   . Heart disease Father   . Cancer Neg Hx   . Stroke Neg Hx   . Kidney disease Neg Hx   . Early death Neg Hx   . Diabetes Sister     SOCIAL HISTORY: Social History   Social History  . Marital status: Single    Spouse name: N/A  . Number of children: N/A  . Years of education: N/A   Occupational History  . Not on file.   Social History Main Topics  . Smoking status: Never Smoker  . Smokeless tobacco: Never Used  . Alcohol use Yes     Comment: weekly  . Drug use: No  . Sexual activity: Not on file  Other Topics Concern  . Not on file   Social History Narrative   Caffienated beverages-Yes   Seat belts often-Yes   Smoke alarm in home-Yes   Firearms/guns in home-Yes   History of physical abuse-NO   HLE- Masters degree    REVIEW OF SYSTEMS: Constitutional: No fevers, chills, or sweats, no generalized fatigue, change in appetite Eyes: No visual changes, double vision, eye pain Ear, nose and throat: No hearing loss, ear pain, nasal congestion, sore throat Cardiovascular: No chest pain, palpitations Respiratory:  No shortness of breath at rest or with exertion, wheezes GastrointestinaI: No nausea, vomiting, diarrhea, abdominal pain, fecal incontinence Genitourinary:  No dysuria, urinary retention or frequency Musculoskeletal:  No neck pain, back pain Integumentary: No rash, pruritus, skin  lesions Neurological: as above Psychiatric: No depression, insomnia, anxiety Endocrine: No palpitations, fatigue, diaphoresis, mood swings, change in appetite, change in weight, increased thirst Hematologic/Lymphatic:  No purpura, petechiae. Allergic/Immunologic: no itchy/runny eyes, nasal congestion, recent allergic reactions, rashes  PHYSICAL EXAM: Vitals:   12/14/15 0801  Pulse: 86   General: No acute distress.  Patient appears well-groomed.  normal body habitus. Head:  Normocephalic/atraumatic Eyes:  Fundi examined but not visualized Neck: supple, right paraspinal tenderness, decreased right side bending, otherwise full range of motion although painful. Neurological Exam: alert and oriented to person, place, and time. Attention span and concentration intact, recent and remote memory intact, fund of knowledge intact.  Speech fluent and not dysarthric, language intact.  CN II-XII intact. Bulk and tone normal, muscle strength 5/5 throughout.  Sensation to light touch, temperature and vibration intact.  Deep tendon reflexes 2+ throughout, toes downgoing.  Finger to nose and heel to shin testing intact.  Gait normal, Romberg negative.  IMPRESSION: Cervicogenic headache Acute posterior neck pain, no apparent injury or strain Paresthesia in right hand and forearm, likely carpal tunnel but cannot rule out radiculopathy.  PLAN: 1.  If no contraindication (and there doesn't appear to be), recommended taking Medrol Dosepak to reduce inflammation 2.  Tizanidine 2mg  to 4mg  as needed. 3.  Will get MRI of cervical spine to look for structural pathology 4.  He has a follow up appointment with Dr. Tamala Julian on Wednesday and will also discuss it with him. 5.  Follow up soon.  25 minutes spent face to face with patient, over 50% spent counseling.  Metta Clines, DO  CC:  Scarlette Calico, MD

## 2015-12-14 NOTE — Patient Instructions (Signed)
1.  I would take the steroid taper, which can help reduce inflammation 2.  Take the tizanidine 1 to 2 tablets as needed for acute pain 3.  We will get MRI of cervical spine to look for any structural cause of pain. 4.  Discuss it with Dr. Tamala Julian as well. 5.  Follow up

## 2015-12-15 NOTE — Progress Notes (Signed)
[name] Daniel [name], [place] [address] Phone: [phone] Subjective:     CC: Right knee pain f/u  [id number]  Daniel [name] is a 65 y.o. male coming in with complaint of right knee pain..  Patient was found to have a significant knee effusion with a lateral meniscal tear. Patient did have recurrent swelling and did have an injection 12 weeks ago.patient has done home exercises, formal physical therapy, and all other conservative therapy. Patient states he is doing very well. Some mild discomfort from time to time. No swelling. No giving out on him. Overall happy.  Previous x-rays of patient's name in December 2016 showed moderate joint effusion but otherwise fairly unremarkable.  Patient previously has had difficulty with shoulder and had signs and symptoms consistent with a labral tear. Had been doing very well with conservative therapy.  Patient states having more of a neck pain. Patient didn't follow-up with neurology. They are getting an MRI. Overall though patient's previous x-rays 2015 very minimal degenerative disc disease.patient states that in the long day he can have significant pain on the right upper aspect of the neck. Denies any significant radiation to the shoulder at the moment.states that this seems to be worsening pain. Even at night can give him discomfort when changing position. States associated with headaches.  Past Medical History:  Diagnosis Date  . Acne   . Anxiety   . DDD (degenerative disc disease), lumbosacral    chronic back pain  . Diverticulosis of colon (without mention of hemorrhage)    colo 09/2010  . Low back pain radiating to both legs    Past Surgical History:  Procedure Laterality Date  . CATARACT EXTRACTION Left 07/14/2015  . CYST EXCISION Left 06/23/2015   Procedure: EXCISION MASS LEFT INDEX FINGER;  Surgeon: Daryll Brod, MD;  Location: [facility];  Service: Orthopedics;  Laterality:  Left;  ANESTHESIA: IV REGIONAL/FAB  . HERNIA REPAIR    . REFRACTIVE SURGERY     left eye/ with enhancement  . TONSILLECTOMY     Social History  Substance Use Topics  . Smoking status: Never Smoker  . Smokeless tobacco: Never Used  . Alcohol use Yes     Comment: weekly   Allergies  Allergen Reactions  . Durezol [Difluprednate]     Eye drops-swelling   Family History  Problem Relation Age of Onset  . Diabetes Mother   . Hypertension Mother   . Arthritis Mother   . Arthritis Father   . Heart disease Father   . Cancer Neg Hx   . Stroke Neg Hx   . Kidney disease Neg Hx   . Early death Neg Hx   . Diabetes Sister     Past medical history, social, surgical and family history all reviewed in electronic medical record.   Review of Systems: No headache, visual changes, nausea, vomiting, diarrhea, constipation, dizziness, abdominal pain, skin rash, fevers, chills, night sweats, weight loss, swollen lymph nodes, body aches, joint swelling, muscle aches, chest pain, shortness of breath, mood changes.   Objective  There were no vitals taken for this visit.  General: No apparent distress alert and oriented x3 mood and affect normal, dressed appropriately.  HEENT: Pupils equal, extraocular movements intact  Respiratory: Patient's speak in full sentences and does not appear short of breath  Cardiovascular: No lower extremity edema, non tender, no erythema  Skin: Warm dry intact with no signs of infection or rash on extremities or  on axial skeleton.  Abdomen: Soft nontender  Neuro: Cranial nerves II through XII are intact, neurovascularly intact in all extremities with 2+ DTRs and 2+ pulses.  Lymph: No lymphadenopathy of posterior or anterior cervical chain or axillae bilaterally.  Gait normal with good balance and coordination.  MSK:  Non tender with full range of motion and good stability and symmetric strength and tone of  elbows, wrist, hip, and ankles bilaterally.    Neck: Inspection unremarkable. No palpable stepoffs. Negative Spurling's maneuver. Mild decrease in range of motion especially with right-sided side bending in left-sided rotation. Grip strength and sensation normal in bilateral hands Strength good C4 to T1 distribution No sensory change to C4 to T1 Negative Hoffman sign bilaterally Reflexes normal  Knee: Right No swelling noted ROM full in flexion and extension and lower leg rotation. Ligaments with solid consistent endpoints including ACL, PCL, LCL, MCL. Negative McMurray's Non painful patellar compression. Patellar glide without crepitus. Patellar and quadriceps tendons unremarkable. Hamstring and quadriceps strength is normal.  Contralateral knee unremarkable. Doing very well.  Osteopathic findings C2 flexed rotated and side bent right T3 extended rotated and side bent right inhaled third rib      Impression and Recommendations:     This case required medical decision making of moderate complexity.

## 2015-12-16 ENCOUNTER — Encounter: Payer: Self-pay | Admitting: Family Medicine

## 2015-12-16 ENCOUNTER — Ambulatory Visit (INDEPENDENT_AMBULATORY_CARE_PROVIDER_SITE_OTHER): Payer: BLUE CROSS/BLUE SHIELD | Admitting: Family Medicine

## 2015-12-16 VITALS — BP 122/82 | HR 64 | Wt 207.0 lb

## 2015-12-16 DIAGNOSIS — M9903 Segmental and somatic dysfunction of lumbar region: Secondary | ICD-10-CM | POA: Diagnosis not present

## 2015-12-16 DIAGNOSIS — M542 Cervicalgia: Secondary | ICD-10-CM

## 2015-12-16 DIAGNOSIS — M9908 Segmental and somatic dysfunction of rib cage: Secondary | ICD-10-CM

## 2015-12-16 DIAGNOSIS — M9901 Segmental and somatic dysfunction of cervical region: Secondary | ICD-10-CM | POA: Diagnosis not present

## 2015-12-16 DIAGNOSIS — M999 Biomechanical lesion, unspecified: Secondary | ICD-10-CM

## 2015-12-16 DIAGNOSIS — S83206D Unspecified tear of unspecified meniscus, current injury, right knee, subsequent encounter: Secondary | ICD-10-CM

## 2015-12-16 DIAGNOSIS — M9904 Segmental and somatic dysfunction of sacral region: Secondary | ICD-10-CM

## 2015-12-16 DIAGNOSIS — G8929 Other chronic pain: Secondary | ICD-10-CM

## 2015-12-16 NOTE — Patient Instructions (Addendum)
Good to see you  Ice still can be a great ally On wall with heels, butt shoulder and head touching for a goal of 5 minutes daily  Tennis ball between shoulder blades with sitting Monitor at eye level.  When tightness 2 tennisballs in a tube sock and lay on them where head meets neck. Usually take about 5 minutes for release.  See me again in 3-4 weeks.

## 2015-12-16 NOTE — Assessment & Plan Note (Signed)
Decision today to treat with OMT was based on Physical Exam  After verbal consent patient was treated with HVLA, ME techniques in cervical, thoracic and rib areas  Patient tolerated the procedure well with improvement in symptoms  Patient given exercises, stretches and lifestyle modifications  See medications in patient instructions if given  Patient will follow up in 3-6 weeks

## 2015-12-16 NOTE — Assessment & Plan Note (Signed)
Has had difficulty in the past. Patient has had tightness. Has responded to osteopathic manipulation previously. We will start again and see if this will be helpful. We discussed seeing him every 4-6 weeks for the next multiple intervals. Patient has seen neurology today we'll begin in an MRI to further evaluate any structural pathology. I think that this will be hopefully normal. We'll discuss continuing changes and ergonomics. Patient did respond well to the osteopathic manipulation and closely with the interventions will notice improvement. Can take the muscle relaxer as needed.

## 2015-12-16 NOTE — Assessment & Plan Note (Signed)
Doing well at this time. We'll continue to monitor. Patient will continue with conservative therapy. Does not want advance imaging would not want surgical intervention.

## 2015-12-20 ENCOUNTER — Ambulatory Visit
Admission: RE | Admit: 2015-12-20 | Discharge: 2015-12-20 | Disposition: A | Discharge status: Home / Self Care | Payer: BLUE CROSS/BLUE SHIELD | Source: Ambulatory Visit | Attending: Neurology | Admitting: Neurology

## 2015-12-20 DIAGNOSIS — M542 Cervicalgia: Secondary | ICD-10-CM

## 2015-12-20 MED ORDER — GADOBENATE DIMEGLUMINE 529 MG/ML IV SOLN
19.0000 mL | Freq: Once | INTRAVENOUS | Status: AC | PRN
  Administered 2015-12-20: 19 mL via INTRAVENOUS

## 2015-12-21 ENCOUNTER — Telehealth: Payer: Self-pay

## 2015-12-21 NOTE — Telephone Encounter (Signed)
-----   Message from Pieter Partridge, DO sent at 12/21/2015  8:32 AM EDT ----- MRI shows nothing in the neck to explain neck pain.  We will continue conservative treatment.

## 2015-12-21 NOTE — Telephone Encounter (Signed)
Left message on machine for pt to return call to the office.  

## 2015-12-22 NOTE — Telephone Encounter (Signed)
Message relayed to patient. Verbalized understanding and denied questions.   

## 2015-12-22 NOTE — Telephone Encounter (Signed)
Left message on machine for pt to return call to the office.  

## 2016-01-04 DIAGNOSIS — M5030 Other cervical disc degeneration, unspecified cervical region: Secondary | ICD-10-CM | POA: Insufficient documentation

## 2016-01-04 NOTE — Assessment & Plan Note (Addendum)
MRI does show degenerative disc disease Patient will do more icing, responding to osteopathic manipulation, we discussed icing regimen. Patient is on a low dose of amitriptyline. Patient has muscle relaxer for breakthrough pain. We discussed over-the-counter medications. Patient will follow-up with me again in 4-6 weeks.

## 2016-01-04 NOTE — Assessment & Plan Note (Signed)
Decision today to treat with OMT was based on Physical Exam  After verbal consent patient was treated with HVLA, ME techniques in cervical, thoracic and rib areas  Patient tolerated the procedure well with improvement in symptoms  Patient given exercises, stretches and lifestyle modifications  See medications in patient instructions if given  Patient will follow up in 4-6 weeks

## 2016-01-04 NOTE — Progress Notes (Signed)
[name] [place], [place] [address] Phone: [phone] Subjective:     CC: Right knee pain f/u Neck and headache follow-up  [id number]  Lavone Lauture is a 65 y.o. male coming in with complaint of right knee pain..  Patient was found to have a significant knee effusion with a lateral meniscal tear. Having no pain whatsoever.  Patient is also having neck pain. Patient is seen neurology. Patient did have an MRI of the cervical spine 12/20/2015 showed moderate cervical disc degenerative changes. Otherwise unremarkable. No true nerve impingement.  We attempted osteopathic manipulation. We discussed icing regimen and home exercises. We discussed which activities to do ergonomics throughout the day. Patient states It did seem to help. Patient has not been doing the exercises on areolar basis though. Patient states that if he does work on posture he does seem to do better. Patient states that right now it is his slow season and overall does think he is improving slowly but having difficulty once again being motivated to do it on a regular basis.  Previous x-rays of patient's name in December 2016 showed moderate joint effusion but otherwise fairly unremarkable.      Past Medical History:  Diagnosis Date  . Acne   . Anxiety   . DDD (degenerative disc disease), lumbosacral    chronic back pain  . Diverticulosis of colon (without mention of hemorrhage)    colo 09/2010  . Low back pain radiating to both legs    Past Surgical History:  Procedure Laterality Date  . CATARACT EXTRACTION Left 07/14/2015  . CYST EXCISION Left 06/23/2015   Procedure: EXCISION MASS LEFT INDEX FINGER;  Surgeon: Daryll Brod, MD;  Location: [facility];  Service: Orthopedics;  Laterality: Left;  ANESTHESIA: IV REGIONAL/FAB  . HERNIA REPAIR    . REFRACTIVE SURGERY     left eye/ with enhancement  . TONSILLECTOMY     Social History  Substance Use Topics    . Smoking status: Never Smoker  . Smokeless tobacco: Never Used  . Alcohol use Yes     Comment: weekly   Allergies  Allergen Reactions  . Durezol [Difluprednate]     Eye drops-swelling   Family History  Problem Relation Age of Onset  . Diabetes Mother   . Hypertension Mother   . Arthritis Mother   . Arthritis Father   . Heart disease Father   . Cancer Neg Hx   . Stroke Neg Hx   . Kidney disease Neg Hx   . Early death Neg Hx   . Diabetes Sister     Past medical history, social, surgical and family history all reviewed in electronic medical record.   Review of Systems: No headache, visual changes, nausea, vomiting, diarrhea, constipation, dizziness, abdominal pain, skin rash, fevers, chills, night sweats, weight loss, swollen lymph nodes, body aches, joint swelling, muscle aches, chest pain, shortness of breath, mood changes.   Objective  Blood pressure 128/84, pulse (!) 59, weight 211 lb (95.7 kg), SpO2 97 %.  General: No apparent distress alert and oriented x3 mood and affect normal, dressed appropriately.  HEENT: Pupils equal, extraocular movements intact  Respiratory: Patient's speak in full sentences and does not appear short of breath  Cardiovascular: No lower extremity edema, non tender, no erythema  Skin: Warm dry intact with no signs of infection or rash on extremities or on axial skeleton.  Abdomen: Soft nontender  Neuro: Cranial nerves II through XII are  intact, neurovascularly intact in all extremities with 2+ DTRs and 2+ pulses.  Lymph: No lymphadenopathy of posterior or anterior cervical chain or axillae bilaterally.  Gait normal with good balance and coordination.  MSK:  Non tender with full range of motion and good stability and symmetric strength and tone of  elbows, wrist, hip, and ankles bilaterally.   Neck: Inspection unremarkable. No palpable stepoffs. Negative Spurling's maneuver. Continued decrease in range of motion lacking the last 5 of extension  as well as side bending bilaterally tightness noted with some mild crepitus Grip strength and sensation normal in bilateral hands Strength good C4 to T1 distribution No sensory change to C4 to T1 Negative Hoffman sign bilaterally Reflexes normal  Knee: Right No swelling noted ROM full in flexion and extension and lower leg rotation. Ligaments with solid consistent endpoints including ACL, PCL, LCL, MCL. Negative McMurray's Non painful patellar compression. Patellar glide without crepitus. Patellar and quadriceps tendons unremarkable. Hamstring and quadriceps strength is normal.  Contralateral knee unremarkable.   Osteopathic findings C2 flexed rotated and side bent right T3 extended rotated and side bent right inhaled third rib Sacrum left on left     Impression and Recommendations:     This case required medical decision making of moderate complexity.

## 2016-01-05 ENCOUNTER — Encounter: Payer: Self-pay | Admitting: Family Medicine

## 2016-01-05 ENCOUNTER — Ambulatory Visit (INDEPENDENT_AMBULATORY_CARE_PROVIDER_SITE_OTHER): Payer: BLUE CROSS/BLUE SHIELD | Admitting: Family Medicine

## 2016-01-05 VITALS — BP 128/84 | HR 59 | Wt 211.0 lb

## 2016-01-05 DIAGNOSIS — M5030 Other cervical disc degeneration, unspecified cervical region: Secondary | ICD-10-CM | POA: Diagnosis not present

## 2016-01-05 DIAGNOSIS — M9903 Segmental and somatic dysfunction of lumbar region: Secondary | ICD-10-CM

## 2016-01-05 DIAGNOSIS — M999 Biomechanical lesion, unspecified: Secondary | ICD-10-CM

## 2016-01-05 DIAGNOSIS — M9904 Segmental and somatic dysfunction of sacral region: Secondary | ICD-10-CM | POA: Diagnosis not present

## 2016-01-05 DIAGNOSIS — G8929 Other chronic pain: Secondary | ICD-10-CM

## 2016-01-05 DIAGNOSIS — M542 Cervicalgia: Secondary | ICD-10-CM

## 2016-01-05 DIAGNOSIS — M9901 Segmental and somatic dysfunction of cervical region: Secondary | ICD-10-CM

## 2016-01-05 NOTE — Patient Instructions (Signed)
God to se Arlan Organ is your friend Stay active Tennis ball between shoulder blades in the car or sitting a long amount of time.  On wall with heels, butt shoulder and head touching for a goal of 5 minutes daily  See me again in 6-8 weeks or call sooner if worsening.

## 2016-01-05 NOTE — Assessment & Plan Note (Signed)
Patient does have some arthritic changes noted. Patient though is responding to conservative therapy. If worsening symptoms she he could be a candidate for epidural. Discussed icing regimen. Discussed posture. Patient will see me on a fairly regular basis and see me again in 6-8 weeks.

## 2016-01-07 ENCOUNTER — Other Ambulatory Visit: Payer: Self-pay | Admitting: Neurology

## 2016-02-03 ENCOUNTER — Other Ambulatory Visit: Payer: Self-pay | Admitting: Neurology

## 2016-02-05 ENCOUNTER — Other Ambulatory Visit: Payer: Self-pay | Admitting: *Deleted

## 2016-02-05 MED ORDER — HYDROXYZINE HCL 10 MG PO TABS: 10.0000 mg | ORAL_TABLET | Freq: Three times a day (TID) | ORAL | 3 refills | Status: DC | PRN

## 2016-02-05 NOTE — Telephone Encounter (Signed)
Hydroxyzine 10mg  sent into pharmacy

## 2016-02-10 ENCOUNTER — Ambulatory Visit: Payer: BLUE CROSS/BLUE SHIELD | Admitting: Neurology

## 2016-02-17 ENCOUNTER — Encounter: Payer: Self-pay | Admitting: Family Medicine

## 2016-02-17 ENCOUNTER — Ambulatory Visit (INDEPENDENT_AMBULATORY_CARE_PROVIDER_SITE_OTHER): Payer: BLUE CROSS/BLUE SHIELD | Admitting: Family Medicine

## 2016-02-17 VITALS — BP 128/84 | HR 66 | Wt 209.0 lb

## 2016-02-17 DIAGNOSIS — M9908 Segmental and somatic dysfunction of rib cage: Secondary | ICD-10-CM

## 2016-02-17 DIAGNOSIS — M9903 Segmental and somatic dysfunction of lumbar region: Secondary | ICD-10-CM | POA: Diagnosis not present

## 2016-02-17 DIAGNOSIS — M999 Biomechanical lesion, unspecified: Secondary | ICD-10-CM

## 2016-02-17 DIAGNOSIS — M9901 Segmental and somatic dysfunction of cervical region: Secondary | ICD-10-CM | POA: Diagnosis not present

## 2016-02-17 DIAGNOSIS — M9904 Segmental and somatic dysfunction of sacral region: Secondary | ICD-10-CM

## 2016-02-17 DIAGNOSIS — M5030 Other cervical disc degeneration, unspecified cervical region: Secondary | ICD-10-CM | POA: Diagnosis not present

## 2016-02-17 MED ORDER — VENLAFAXINE HCL ER 37.5 MG PO CP24: 37.5000 mg | ORAL_CAPSULE | Freq: Every day | ORAL | 1 refills | Status: DC

## 2016-02-17 NOTE — Progress Notes (Signed)
[name] [place], [place] [address] Phone: [phone] Subjective:     CC:  Neck and headache follow-up  [id number]  Daniel [name] is a 65 y.o. male coming in with complaint of   Patient is also having neck pain. Patient is seen neurology. Patient did have an MRI of the cervical spine 12/20/2015 showed moderate cervical disc degenerative changes. Otherwise unremarkable. No true nerve impingement.  We attempted osteopathic manipulation. We discussed icing regimen and home exercises. We discussed which activities to do ergonomics throughout the day. Patient has been having more stress or swelling. Having difficult even rotating had a little bit. Patient and send it seems to be aggravating him. States that the medication he is been taking at night has helped a little bit but not a significant amount. Continues amitriptyline a low dose. Has not used the muscle relaxer.    Past Medical History:  Diagnosis Date  . Acne   . Anxiety   . DDD (degenerative disc disease), lumbosacral    chronic back pain  . Diverticulosis of colon (without mention of hemorrhage)    colo 09/2010  . Low back pain radiating to both legs    Past Surgical History:  Procedure Laterality Date  . CATARACT EXTRACTION Left 07/14/2015  . CYST EXCISION Left 06/23/2015   Procedure: EXCISION MASS LEFT INDEX FINGER;  Surgeon: Daryll Brod, MD;  Location: [facility];  Service: Orthopedics;  Laterality: Left;  ANESTHESIA: IV REGIONAL/FAB  . HERNIA REPAIR    . REFRACTIVE SURGERY     left eye/ with enhancement  . TONSILLECTOMY     Social History  Substance Use Topics  . Smoking status: Never Smoker  . Smokeless tobacco: Never Used  . Alcohol use Yes     Comment: weekly   Allergies  Allergen Reactions  . Durezol [Difluprednate]     Eye drops-swelling   Family History  Problem Relation Age of Onset  . Diabetes Mother   . Hypertension Mother   .  Arthritis Mother   . Arthritis Father   . Heart disease Father   . Cancer Neg Hx   . Stroke Neg Hx   . Kidney disease Neg Hx   . Early death Neg Hx   . Diabetes Sister     Past medical history, social, surgical and family history all reviewed in electronic medical record.   Review of Systems: No headache, visual changes, nausea, vomiting, diarrhea, constipation, dizziness, abdominal pain, skin rash, fevers, chills, night sweats, weight loss, swollen lymph nodes, body aches, joint swelling, muscle aches, chest pain, shortness of breath, mood changes.   Objective  Blood pressure 128/84, pulse 66, weight 209 lb (94.8 kg), SpO2 95 %.  General: No apparent distress alert and oriented x3 mood and affect normal, dressed appropriately.  HEENT: Pupils equal, extraocular movements intact  Respiratory: Patient's speak in full sentences and does not appear short of breath  Cardiovascular: No lower extremity edema, non tender, no erythema  Skin: Warm dry intact with no signs of infection or rash on extremities or on axial skeleton.  Abdomen: Soft nontender  Neuro: Cranial nerves II through XII are intact, neurovascularly intact in all extremities with 2+ DTRs and 2+ pulses.  Lymph: No lymphadenopathy of posterior or anterior cervical chain or axillae bilaterally.  Gait normal with good balance and coordination.  MSK:  Non tender with full range of motion and good stability and symmetric strength and tone of  elbows,  wrist, hip, and ankles bilaterally.   Neck: Inspection unremarkable. No palpable stepoffs. Negative Spurling's maneuver. Continued decrease in range of motion lacking the last 5 of extension as well as side bending bilaterally tightness noted with some mild crepitus Grip strength and sensation normal in bilateral hands Strength good C4 to T1 distribution No sensory change to C4 to T1 Negative Hoffman sign bilaterally Reflexes normal   Osteopathic findings C2 flexed rotated and  side bent right T3 extended rotated and side bent right inhaled third rib L2 flexed rotated and side bent right Sacrum left on left     Impression and Recommendations:     This case required medical decision making of moderate complexity.

## 2016-02-17 NOTE — Assessment & Plan Note (Signed)
Decision today to treat with OMT was based on Physical Exam  After verbal consent patient was treated with HVLA, ME techniques in cervical, thoracic and rib areas  Patient tolerated the procedure well with improvement in symptoms  Patient given exercises, stretches and lifestyle modifications  See medications in patient instructions if given  Patient will follow up in 4-6 weeks

## 2016-02-17 NOTE — Patient Instructions (Signed)
Good to see you  Ice is your friend effexor at a low dose.  37.5 mg daily  Take muscle relaxer at night for next 3 nights to help with sleep  See me again in 3-4 weeks.

## 2016-02-17 NOTE — Assessment & Plan Note (Signed)
Overall seems to be doing relatively well. We discussed icing regimen and home exercises again. I do believe the patient is having increasing stress with some increasing muscle tightness. Patient encouraged to take the muscle relaxer on a regular basis. Patient given Effexor as well that I'm hopeful help with some anxiety that is underlying this as well. Patient will come back and see me again in 3-4 weeks.

## 2016-03-02 ENCOUNTER — Encounter: Payer: Self-pay | Admitting: Neurology

## 2016-03-02 ENCOUNTER — Other Ambulatory Visit: Payer: Self-pay | Admitting: Neurology

## 2016-03-14 NOTE — Progress Notes (Signed)
[name] [place], [place] [address] Phone: [phone] Subjective:     CC:  Neck and headache follow-up  [id number]  Daniel [name] is a 65 y.o. male coming in with complaint of   Patient is also having neck pain. Patient is seen neurology. Patient did have an MRI of the cervical spine 12/20/2015 showed moderate cervical disc degenerative changes. Otherwise unremarkable. No true nerve impingement.  We attempted osteopathic manipulation. Patient was doing home exercises and working on Personal assistant. Seem to be making some progress. Patient states some increasing upper back and neck pain secondary to him pain kinking. 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 Since he's been doing that having more discomfort overall.    Past Medical History:  Diagnosis Date  . Acne   . Anxiety   . DDD (degenerative disc disease), lumbosacral    chronic back pain  . Diverticulosis of colon (without mention of hemorrhage)    colo 09/2010  . Low back pain radiating to both legs    Past Surgical History:  Procedure Laterality Date  . CATARACT EXTRACTION Left 07/14/2015  . CYST EXCISION Left 06/23/2015   Procedure: EXCISION MASS LEFT INDEX FINGER;  Surgeon: Daryll Brod, MD;  Location: [facility];  Service: Orthopedics;  Laterality: Left;  ANESTHESIA: IV REGIONAL/FAB  . HERNIA REPAIR    . REFRACTIVE SURGERY     left eye/ with enhancement  . TONSILLECTOMY     Social History  Substance Use Topics  . Smoking status: Never Smoker  . Smokeless tobacco: Never Used  . Alcohol use Yes     Comment: weekly   Allergies  Allergen Reactions  . Durezol [Difluprednate]     Eye drops-swelling   Family History  Problem Relation Age of Onset  . Diabetes Mother   . Hypertension Mother   . Arthritis Mother   . Arthritis Father   . Heart disease Father   . Cancer Neg Hx   . Stroke Neg Hx   . Kidney disease Neg Hx     . Early death Neg Hx   . Diabetes Sister     Past medical history, social, surgical and family history all reviewed in electronic medical record.   Review of Systems: No headache, visual changes, nausea, vomiting, diarrhea, constipation, dizziness, abdominal pain, skin rash, fevers, chills, night sweats, weight loss, swollen lymph nodes, body aches, joint swelling, muscle aches, chest pain, shortness of breath, mood changes.   Objective  Blood pressure 128/82, pulse 70, weight 208 lb (94.3 kg), SpO2 96 %.  General: No apparent distress alert and oriented x3 mood and affect normal, dressed appropriately.  HEENT: Pupils equal, extraocular movements intact  Respiratory: Patient's speak in full sentences and does not appear short of breath  Cardiovascular: No lower extremity edema, non tender, no erythema  Skin: Warm dry intact with no signs of infection or rash on extremities or on axial skeleton.  Abdomen: Soft nontender  Neuro: Cranial nerves II through XII are intact, neurovascularly intact in all extremities with 2+ DTRs and 2+ pulses.  Lymph: No lymphadenopathy of posterior or anterior cervical chain or axillae bilaterally.  Gait normal with good balance and coordination.  MSK:  Non tender with full range of motion and good stability and symmetric strength and tone of  elbows, wrist, hip, and ankles bilaterally.   Neck: Inspection unremarkable. No palpable stepoffs. Negative Spurling's maneuver. Mild increasing tenderness from  previous exam Lacking the last 10 of extension. Significant more tightness and tenderness and appears palmar musculature of the cervical and upper thoracic spine Grip strength and sensation normal in bilateral hands Strength good C4 to T1 distribution No sensory change to C4 to T1 Negative Hoffman sign bilaterally Reflexes normal   Osteopathic findings C2 flexed rotated and side bent right T3 extended rotated and side bent right inhaled third rib T6  extended rotated and side bent left L2 flexed rotated and side bent right Sacrum left on left     Impression and Recommendations:     This case required medical decision making of moderate complexity.

## 2016-03-14 NOTE — Assessment & Plan Note (Signed)
Doing relatively well at this time. We discussed icing regimen and home exercises. Discussed which activities doing which ones to avoid. Patient will continue to be active. Patient will continue to work on posture and ergonomics are up-to-date. Follow-up again 4-8 weeks.

## 2016-03-14 NOTE — Assessment & Plan Note (Signed)
Decision today to treat with OMT was based on Physical Exam  After verbal consent patient was treated with HVLA, ME techniques in cervical, thoracic and rib areas  Patient tolerated the procedure well with improvement in symptoms  Patient given exercises, stretches and lifestyle modifications  See medications in patient instructions if given  Patient will follow up in 4-8 weeks

## 2016-03-15 ENCOUNTER — Encounter: Payer: Self-pay | Admitting: Family Medicine

## 2016-03-15 ENCOUNTER — Ambulatory Visit (INDEPENDENT_AMBULATORY_CARE_PROVIDER_SITE_OTHER): Payer: BLUE CROSS/BLUE SHIELD | Admitting: Family Medicine

## 2016-03-15 VITALS — BP 128/82 | HR 70 | Wt 208.0 lb

## 2016-03-15 DIAGNOSIS — Z23 Encounter for immunization: Secondary | ICD-10-CM

## 2016-03-15 DIAGNOSIS — G8929 Other chronic pain: Secondary | ICD-10-CM

## 2016-03-15 DIAGNOSIS — M542 Cervicalgia: Secondary | ICD-10-CM | POA: Diagnosis not present

## 2016-03-15 DIAGNOSIS — M999 Biomechanical lesion, unspecified: Secondary | ICD-10-CM

## 2016-03-15 NOTE — Patient Instructions (Addendum)
Great to see you as always.  Stay active overall.  No real changes With painting try to keep your shoulder back  Consider extending your back over the exercise ball after a lot of painting.  See me again in 3 weeks.

## 2016-03-28 ENCOUNTER — Other Ambulatory Visit: Payer: Self-pay | Admitting: Family Medicine

## 2016-03-28 NOTE — Telephone Encounter (Signed)
Refill done.  

## 2016-04-05 ENCOUNTER — Ambulatory Visit (INDEPENDENT_AMBULATORY_CARE_PROVIDER_SITE_OTHER): Payer: BLUE CROSS/BLUE SHIELD | Admitting: Family Medicine

## 2016-04-05 ENCOUNTER — Encounter: Payer: Self-pay | Admitting: Family Medicine

## 2016-04-05 VITALS — BP 128/82 | HR 56 | Ht 69.0 in | Wt 205.0 lb

## 2016-04-05 DIAGNOSIS — M999 Biomechanical lesion, unspecified: Secondary | ICD-10-CM

## 2016-04-05 DIAGNOSIS — G8929 Other chronic pain: Secondary | ICD-10-CM | POA: Diagnosis not present

## 2016-04-05 DIAGNOSIS — S83206D Unspecified tear of unspecified meniscus, current injury, right knee, subsequent encounter: Secondary | ICD-10-CM | POA: Diagnosis not present

## 2016-04-05 DIAGNOSIS — M542 Cervicalgia: Secondary | ICD-10-CM

## 2016-04-05 MED ORDER — DICLOFENAC SODIUM 2 % TD SOLN: 2.0000 "application " | Freq: Two times a day (BID) | TRANSDERMAL | 3 refills | Status: DC

## 2016-04-05 NOTE — Assessment & Plan Note (Signed)
Decision today to treat with OMT was based on Physical Exam  After verbal consent patient was treated with HVLA, ME techniques in cervical, thoracic and rib areas  Patient tolerated the procedure well with improvement in symptoms  Patient given exercises, stretches and lifestyle modifications  See medications in patient instructions if given  Patient will follow up in 4-8 weeks

## 2016-04-05 NOTE — Assessment & Plan Note (Signed)
Continues to be stable h. Patient does have arthritic changes and may need exacerbation. Still taking the amitriptyline at night. Patient is making some response with the Effexor and may want to increase int will come back and see me again at that time. RTC in 4 weeks.

## 2016-04-05 NOTE — Patient Instructions (Signed)
Good see you  Watch the knee.  pennsaid pinkie amount topically 2 times daily as needed.  Ice is your friend Try to keep workin on the posture [employer] has a great desk  See me again in 4 weeks.

## 2016-04-05 NOTE — Progress Notes (Signed)
[name] [place], [place] [address] Phone: [phone] Subjective:     CC:  Neck and headache follow-up  [id number]  Daniel [name] is a 65 y.o. male coming in with complaint of   Patient is also having neck pain. Patient is seen neurology. Patient did have an MRI of the cervical spine 12/20/2015 showed moderate cervical disc degenerative changes. Otherwise unremarkable. No true nerve impingement.  We attempted osteopathic manipulation. Patient was doing home exercises and working on Personal assistant.Still increase stress. Has been doing the exercises regularly. Patient states though that if he tries to increase the activities to much she has more trouble. Patient is having trouble As he has been more busy recently secondary to being busy.     Past Medical History:  Diagnosis Date  . Acne   . Anxiety   . DDD (degenerative disc disease), lumbosacral    chronic back pain  . Diverticulosis of colon (without mention of hemorrhage)    colo 09/2010  . Low back pain radiating to both legs    Past Surgical History:  Procedure Laterality Date  . CATARACT EXTRACTION Left 07/14/2015  . CYST EXCISION Left 06/23/2015   Procedure: EXCISION MASS LEFT INDEX FINGER;  Surgeon: Daryll Brod, MD;  Location: [facility];  Service: Orthopedics;  Laterality: Left;  ANESTHESIA: IV REGIONAL/FAB  . HERNIA REPAIR    . REFRACTIVE SURGERY     left eye/ with enhancement  . TONSILLECTOMY     Social History  Substance Use Topics  . Smoking status: Never Smoker  . Smokeless tobacco: Never Used  . Alcohol use Yes     Comment: weekly   Allergies  Allergen Reactions  . Durezol [Difluprednate]     Eye drops-swelling   Family History  Problem Relation Age of Onset  . Diabetes Mother   . Hypertension Mother   . Arthritis Mother   . Arthritis Father   . Heart disease Father   . Cancer Neg Hx   . Stroke Neg Hx   . Kidney disease Neg Hx   . Early  death Neg Hx   . Diabetes Sister     Past medical history, social, surgical and family history all reviewed in electronic medical record.   Review of Systems: No headache, visual changes, nausea, vomiting, diarrhea, constipation, dizziness, abdominal pain, skin rash, fevers, chills, night sweats, weight loss, swollen lymph nodes, body aches, joint swelling, muscle aches, chest pain, shortness of breath, mood changes.   Objective  Blood pressure 128/82, pulse (!) 56, height 5\' 9"  (1.753 m), weight 205 lb (93 kg), SpO2 97 %.  General: No apparent distress alert and oriented x3 mood and affect normal, dressed appropriately.  HEENT: Pupils equal, extraocular movements intact  Respiratory: Patient's speak in full sentences and does not appear short of breath  Cardiovascular: No lower extremity edema, non tender, no erythema  Skin: Warm dry intact with no signs of infection or rash on extremities or on axial skeleton.  Abdomen: Soft nontender  Neuro: Cranial nerves II through XII are intact, neurovascularly intact in all extremities with 2+ DTRs and 2+ pulses.  Lymph: No lymphadenopathy of posterior or anterior cervical chain or axillae bilaterally.  Gait normal with good balance and coordination.  MSK:  Non tender with full range of motion and good stability and symmetric strength and tone of  elbows, wrist, hip, and ankles bilaterally.   Left knee exam does show the patient does have some  effusion again. Mild positive McMurray's. No significant instability.   Neck: Inspection unremarkable. No palpable stepoffs.  still tightness  Lacking the last 10 of extension.  Grip strength and sensation normal in bilateral hands Strength good C4 to T1 distribution No sensory change to C4 to T1 Negative Hoffman sign bilaterally Reflexes normal   Osteopathic findings C2 flexed rotated and side bent right T3 extended rotated and side bent right inhaled third rib T7 extended rotated and side bent  left L4 flexed rotated and side bent right Sacrum left on left     Impression and Recommendations:     This case required medical decision making of moderate complexity.

## 2016-04-05 NOTE — Assessment & Plan Note (Signed)
Mild swelling again, will monitor if worsening symptoms may need injection and encouraged bracing.

## 2016-04-19 ENCOUNTER — Other Ambulatory Visit: Payer: Self-pay | Admitting: Family Medicine

## 2016-04-19 NOTE — Telephone Encounter (Signed)
Refill done.  

## 2016-04-21 ENCOUNTER — Other Ambulatory Visit: Payer: Self-pay | Admitting: *Deleted

## 2016-04-26 ENCOUNTER — Other Ambulatory Visit: Payer: Self-pay | Admitting: *Deleted

## 2016-04-26 MED ORDER — HYDROXYZINE HCL 10 MG PO TABS: 10.0000 mg | ORAL_TABLET | Freq: Three times a day (TID) | ORAL | 3 refills | Status: DC | PRN

## 2016-04-26 NOTE — Telephone Encounter (Signed)
Refill done.  

## 2016-05-02 NOTE — Progress Notes (Signed)
[name] [address] [place], [place] [address] Phone: [phone] Subjective:     CC:  Neck and headache follow-up  [id number]  Daniel [name] is a 65 y.o. male coming in with complaint of   Patient is also having neck pain. Patient is seen neurology. Patient did have an MRI of the cervical spine 12/20/2015 showed moderate cervical disc degenerative changes. Otherwise unremarkable. No true nerve impingement.  Patient has done very well with conservative therapy. Has responded very well to osteopathic manipulation. One month out from last treatment. 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 Feels like he is making progress.    Past Medical History:  Diagnosis Date  . Acne   . Anxiety   . DDD (degenerative disc disease), lumbosacral    chronic back pain  . Diverticulosis of colon (without mention of hemorrhage)    colo 09/2010  . Low back pain radiating to both legs    Past Surgical History:  Procedure Laterality Date  . CATARACT EXTRACTION Left 07/14/2015  . CYST EXCISION Left 06/23/2015   Procedure: EXCISION MASS LEFT INDEX FINGER;  Surgeon: Daryll Brod, MD;  Location: [facility];  Service: Orthopedics;  Laterality: Left;  ANESTHESIA: IV REGIONAL/FAB  . HERNIA REPAIR    . REFRACTIVE SURGERY     left eye/ with enhancement  . TONSILLECTOMY     Social History  Substance Use Topics  . Smoking status: Never Smoker  . Smokeless tobacco: Never Used  . Alcohol use Yes     Comment: weekly   Allergies  Allergen Reactions  . Durezol [Difluprednate]     Eye drops-swelling   Family History  Problem Relation Age of Onset  . Diabetes Mother   . Hypertension Mother   . Arthritis Mother   . Arthritis Father   . Heart disease Father   . Cancer Neg Hx   . Stroke Neg Hx   . Kidney disease Neg Hx   . Early death Neg Hx   . Diabetes Sister     Past  medical history, social, surgical and family history all reviewed in electronic medical record.   Review of Systems: No  visual changes, nausea, vomiting, diarrhea, constipation, dizziness, abdominal pain, skin rash, fevers, chills, night sweats, weight loss, swollen lymph nodes, chest pain, shortness of breath, mood changes.  .   Objective  Blood pressure 124/82, pulse 60, height 5\' 9"  (1.753 m), weight 210 lb (95.3 kg), SpO2 97 %.  Systems examined below as of 05/03/16 General: NAD A&O x3 mood, affect normal  HEENT: Pupils equal, extraocular movements intact no nystagmus Respiratory: not short of breath at rest or with speaking Cardiovascular: No lower extremity edema, non tender Skin: Warm dry intact with no signs of infection or rash on extremities or on axial skeleton. Abdomen: Soft nontender, no masses Neuro: Cranial nerves  intact, neurovascularly intact in all extremities with 2+ DTRs and 2+ pulses. Lymph: No lymphadenopathy appreciated today  Gait normal with good balance and coordination.  MSK: Non tender with full range of motion and good stability and symmetric strength and tone of shoulders, elbows, wrist,  hips and ankles bilaterally.    Left knee exam dNo effusion but continues to have positive McMurray's. Patient feels though that it is stable   Neck: Inspection unremarkable. No palpable stepoffs.  Still lacking proximal wing 5 in most planes worse  with extension. Negative Spurling's  Grip strength and sensation normal in bilateral hands Strength good C4 to T1 distribution No sensory change to C4 to T1 Negative Hoffman sign bilaterally Reflexes normal   Osteopathic findings C2 flexed rotated and side bent right T3 extended rotated and side bent right inhaled third rib T9 extended rotated and side bent left L2 flexed rotated and side bent right Sacrum left on left     Impression and Recommendations:     This case required medical decision making of  moderate complexity.

## 2016-05-03 ENCOUNTER — Ambulatory Visit (INDEPENDENT_AMBULATORY_CARE_PROVIDER_SITE_OTHER): Payer: BLUE CROSS/BLUE SHIELD | Admitting: Family Medicine

## 2016-05-03 ENCOUNTER — Encounter: Payer: Self-pay | Admitting: Family Medicine

## 2016-05-03 VITALS — BP 124/82 | HR 60 | Ht 69.0 in | Wt 210.0 lb

## 2016-05-03 DIAGNOSIS — M5137 Other intervertebral disc degeneration, lumbosacral region: Secondary | ICD-10-CM | POA: Diagnosis not present

## 2016-05-03 DIAGNOSIS — M999 Biomechanical lesion, unspecified: Secondary | ICD-10-CM

## 2016-05-03 DIAGNOSIS — G44219 Episodic tension-type headache, not intractable: Secondary | ICD-10-CM

## 2016-05-03 MED ORDER — VENLAFAXINE HCL ER 37.5 MG PO CP24: 37.5000 mg | ORAL_CAPSULE | Freq: Every day | ORAL | 1 refills | Status: DC

## 2016-05-03 MED ORDER — AMITRIPTYLINE HCL 10 MG PO TABS: 10.0000 mg | ORAL_TABLET | Freq: Every day | ORAL | 3 refills | Status: DC

## 2016-05-03 NOTE — Assessment & Plan Note (Signed)
Decision today to treat with OMT was based on Physical Exam  After verbal consent patient was treated with HVLA, ME techniques in cervical, thoracic and rib areas  Patient tolerated the procedure well with improvement in symptoms  Patient given exercises, stretches and lifestyle modifications  See medications in patient instructions if given  Patient will follow up in 4-8 weeks

## 2016-05-03 NOTE — Patient Instructions (Signed)
Good to see you  Ice when you need it Monitor the knee.  Stay active Refilled meds Happy New Year!  See you in 5-6 weeks!

## 2016-05-03 NOTE — Assessment & Plan Note (Signed)
More stable with the Effexor.

## 2016-05-03 NOTE — Assessment & Plan Note (Signed)
Stable overall. We will continue with conservative therapy. We discussed again about core strength and weight loss. Patient has topical anti-inflammatories. Prescription refill for everything else. Follow-up again in 4-6 weeks

## 2016-05-09 ENCOUNTER — Other Ambulatory Visit: Payer: Self-pay | Admitting: *Deleted

## 2016-05-09 MED ORDER — HYDROXYZINE HCL 10 MG PO TABS: 10.0000 mg | ORAL_TABLET | Freq: Three times a day (TID) | ORAL | 1 refills | Status: DC | PRN

## 2016-05-09 MED ORDER — VENLAFAXINE HCL ER 37.5 MG PO CP24: 37.5000 mg | ORAL_CAPSULE | Freq: Every day | ORAL | 1 refills | Status: DC

## 2016-05-09 NOTE — Telephone Encounter (Signed)
Pt called requesting that hydroxyzine & velafaxine be sent into pharmacy for 90-day supply. Refill done.

## 2016-06-07 ENCOUNTER — Encounter: Payer: Self-pay | Admitting: Family Medicine

## 2016-06-07 ENCOUNTER — Ambulatory Visit (INDEPENDENT_AMBULATORY_CARE_PROVIDER_SITE_OTHER): Payer: BLUE CROSS/BLUE SHIELD | Admitting: Family Medicine

## 2016-06-07 VITALS — BP 122/78 | HR 60 | Ht 69.0 in | Wt 214.0 lb

## 2016-06-07 DIAGNOSIS — S83206D Unspecified tear of unspecified meniscus, current injury, right knee, subsequent encounter: Secondary | ICD-10-CM | POA: Diagnosis not present

## 2016-06-07 DIAGNOSIS — M999 Biomechanical lesion, unspecified: Secondary | ICD-10-CM | POA: Diagnosis not present

## 2016-06-07 DIAGNOSIS — M542 Cervicalgia: Secondary | ICD-10-CM

## 2016-06-07 DIAGNOSIS — G8929 Other chronic pain: Secondary | ICD-10-CM | POA: Diagnosis not present

## 2016-06-07 MED ORDER — ALLOPURINOL 100 MG PO TABS: 200.0000 mg | ORAL_TABLET | Freq: Every day | ORAL | 6 refills | Status: DC

## 2016-06-07 NOTE — Assessment & Plan Note (Signed)
Degenerative disc disease noted. We discussed continuing to monitor. As long as patient does well continue with the same regimen and the same medications. Has responded fairly well to the Effexor. Patient will be started on allopurinol case there is some mild increase in uric acid be contributing to the joint swelling from time to time. Patient will come back and see me again in 4-6 weeks for further evaluation. Continues respond well to last a passive manipulation.

## 2016-06-07 NOTE — Assessment & Plan Note (Signed)
Mild worsening effusion noted. We discussed the possibility of aspiration which patient declined. We will continue to monitor. Follow-up again in 4-6 weeks. We'll call sooner if needed.

## 2016-06-07 NOTE — Progress Notes (Signed)
[name] [place] [place], [place] [address] Phone: [phone] Subjective:     CC:  Neck and headache follow-up  [id number]  Daniel [name] is a 66 y.o. male coming in with complaint of   Patient is also having neck pain. Patient is seen neurology. Patient did have an MRI of the cervical spine 12/20/2015 showed moderate cervical disc degenerative changes. Otherwise unremarkable. No true nerve impingement.  Still has tightness overall. Seems that the neck seems to be doing relatively well. No radiation down the arm at this time.  Patient is having some knee swelling again. 3 days ago had significant amount pain but now seems to be resolving on its own. Did not do anything different. When asked diet did consistent more meets as well as seafood recently.   Past Medical History:  Diagnosis Date  . Acne   . Anxiety   . DDD (degenerative disc disease), lumbosacral    chronic back pain  . Diverticulosis of colon (without mention of hemorrhage)    colo 09/2010  . Low back pain radiating to both legs    Past Surgical History:  Procedure Laterality Date  . CATARACT EXTRACTION Left 07/14/2015  . CYST EXCISION Left 06/23/2015   Procedure: EXCISION MASS LEFT INDEX FINGER;  Surgeon: Daryll Brod, MD;  Location: [facility];  Service: Orthopedics;  Laterality: Left;  ANESTHESIA: IV REGIONAL/FAB  . HERNIA REPAIR    . REFRACTIVE SURGERY     left eye/ with enhancement  . TONSILLECTOMY     Social History  Substance Use Topics  . Smoking status: Never Smoker  . Smokeless tobacco: Never Used  . Alcohol use Yes     Comment: weekly   Allergies  Allergen Reactions  . Durezol [Difluprednate]     Eye drops-swelling   Family History  Problem Relation Age of Onset  . Diabetes Mother   . Hypertension Mother   . Arthritis Mother   . Arthritis Father   . Heart disease Father   . Cancer Neg Hx   . Stroke Neg Hx   . Kidney disease Neg Hx     . Early death Neg Hx   . Diabetes Sister     Past medical history, social, surgical and family history all reviewed in electronic medical record.   Review of Systems: No headache, visual changes, nausea, vomiting, diarrhea, constipation, dizziness, abdominal pain, skin rash, fevers, chills, night sweats, weight loss, swollen lymph nodes, chest pain, shortness of breath, mood changes.   .   Objective  Blood pressure 122/78, pulse 60, height 5\' 9"  (1.753 m), weight 214 lb (97.1 kg), SpO2 97 %.  Systems examined below as of 06/07/16 General: NAD A&O x3 mood, affect normal  HEENT: Pupils equal, extraocular movements intact no nystagmus Respiratory: not short of breath at rest or with speaking Cardiovascular: No lower extremity edema, non tender Skin: Warm dry intact with no signs of infection or rash on extremities or on axial skeleton. Abdomen: Soft nontender, no masses Neuro: Cranial nerves  intact, neurovascularly intact in all extremities with 2+ DTRs and 2+ pulses. Lymph: No lymphadenopathy appreciated today  Gait normal with good balance and coordination.  MSK: Non tender with full range of motion and good stability and symmetric strength and tone of shoulders, elbows, wrist,  hips and ankles bilaterally.    Left knee does have a trace effusion noted today. Mild increasing tenderness from previous exam. Still positive McMurray's.   Neck: Inspection  unremarkable. No palpable stepoffs. Still lacking some motion Negative Spurling's  Grip strength and sensation normal in bilateral hands Strength good C4 to T1 distribution No sensory change to C4 to T1 Negative Hoffman sign bilaterally Reflexes normal   Osteopathic findings Osteopathic findings Cervical C4 flexed rotated and side bent left C6 flexed rotated and side bent left T3 extended rotated and side bent right inhaled third rib T9 extended rotated and side bent left L2 flexed rotated and side bent right Sacrum right  on right     Impression and Recommendations:     This case required medical decision making of moderate complexity.

## 2016-06-07 NOTE — Patient Instructions (Signed)
Go to see you  Lets try allopurinol 200mg  daily and see if that helps everything.  Ice is your friend Call me if the knee gets worse See me again in 4-6 weeks!

## 2016-06-07 NOTE — Assessment & Plan Note (Signed)
Decision today to treat with OMT was based on Physical Exam  After verbal consent patient was treated with HVLA, ME techniques in cervical, thoracic and rib areas  Patient tolerated the procedure well with improvement in symptoms  Patient given exercises, stretches and lifestyle modifications  See medications in patient instructions if given  Patient will follow up in 4-8 weeks

## 2016-07-04 NOTE — Progress Notes (Signed)
[name] [place] [place], [place] [address] Phone: [phone] Subjective:     CC:  Neck and headache follow-up New left-sided knee pain  [id number]  Daniel [name] is a 66 y.o. male coming in with complaint of   Patient is also having neck pain. Patient is seen neurology. Patient did have an MRI of the cervical spine 12/20/2015 showed moderate cervical disc degenerative changes. Otherwise unremarkable. No true nerve impingement.  Update 07/05/16- Patient states that his neck seems to be a little tight. 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 Patient tries to be active but continues to have some difficulty. Patient denies any numbness though. Patient denies any radiation down the arm. Has responded well to osteopathic manipulation. Just worsening of previous symptoms and no new symptoms.   Patient is having some knee swelling again.  This time and is on the left knee. Patient has had been intermittently but has not done anything before consider releasing to go away after 1 or 2 days. Patient is not really ready true injury. Hurting him at night and waking him up. Mild instability.   Past Medical History:  Diagnosis Date  . Acne   . Anxiety   . DDD (degenerative disc disease), lumbosacral    chronic back pain  . Diverticulosis of colon (without mention of hemorrhage)    colo 09/2010  . Low back pain radiating to both legs    Past Surgical History:  Procedure Laterality Date  . CATARACT EXTRACTION Left 07/14/2015  . CYST EXCISION Left 06/23/2015   Procedure: EXCISION MASS LEFT INDEX FINGER;  Surgeon: Daryll Brod, MD;  Location: [facility];  Service: Orthopedics;  Laterality: Left;  ANESTHESIA: IV REGIONAL/FAB  . HERNIA REPAIR    . REFRACTIVE SURGERY     left eye/ with enhancement  . TONSILLECTOMY     Social History  Substance Use Topics  . Smoking status: Never Smoker  . Smokeless tobacco: Never Used    . Alcohol use Yes     Comment: weekly   Allergies  Allergen Reactions  . Durezol [Difluprednate]     Eye drops-swelling   Family History  Problem Relation Age of Onset  . Diabetes Mother   . Hypertension Mother   . Arthritis Mother   . Arthritis Father   . Heart disease Father   . Cancer Neg Hx   . Stroke Neg Hx   . Kidney disease Neg Hx   . Early death Neg Hx   . Diabetes Sister     Past medical history, social, surgical and family history all reviewed in electronic medical record.   Review of Systems: No headache, visual changes, nausea, vomiting, diarrhea, constipation, dizziness, abdominal pain, skin rash, fevers, chills, night sweats, weight loss, swollen lymph nodes, body aches,chest pain, shortness of breath, mood changes.  Positive muscle aches and positive joint swelling of the left knee .   Objective  Blood pressure 130/82, pulse 62, height 5\' 9"  (1.753 m), weight 213 lb (96.6 kg), SpO2 98 %.  Systems examined below as of 07/05/16 General: NAD A&O x3 mood, affect normal  HEENT: Pupils equal, extraocular movements intact no nystagmus Respiratory: not short of breath at rest or with speaking Cardiovascular: No lower extremity edema, non tender Skin: Warm dry intact with no signs of infection or rash on extremities or on axial skeleton. Abdomen: Soft nontender, no masses Neuro: Cranial nerves  intact, neurovascularly intact in all extremities  with 2+ DTRs and 2+ pulses. Lymph: No lymphadenopathy appreciated today  Gait Antalgic gait MSK: Non tender with full range of motion and good stability and symmetric strength and tone of shoulders, elbows, wrist,  hips and ankles bilaterally.    Left knee does have a effusion noted today. Worsening of previous. Mild increasing tenderness from previous exam. Still positive McMurray's. She has limited range of motion with only 95 of flexion.   Neck: Inspection unremarkable. No palpable stepoffs. Still lacking some motion  Negative Spurling's Patient has limited range of motion with rotation and side bending bilaterally  Grip strength and sensation normal in bilateral hands Strength good C4 to T1 distribution No sensory change to C4 to T1 Negative Hoffman sign bilaterally Reflexes normal   Osteopathic findings Cervical C2 flexed rotated and side bent right C4 flexed rotated and side bent left C6 flexed rotated and side bent left T3 extended rotated and side bent right inhaled third rib T8 extended rotated and side bent left L2 flexed rotated and side bent right Sacrum right on right    MSK US performed of: Left knee This study was ordered, performed, and interpreted by Charlann Boxer D.O.  Knee: Effusion with synovitis noted Mild degenerative changes but no true Acute tear noted. Mild narrowing of the medial joint space. Patellar Tendon unremarkable on long and transverse views without effusion. No abnormality of prepatellar bursa. LCL and MCL unremarkable on long and transverse views. No abnormality of origin of medial or lateral head of the gastrocnemius.  IMPRESSION:  Synovitis of the knee  Procedure: Real-time Ultrasound Guided Injection of left knee Device: GE Logiq Q7 Ultrasound guided injection is preferred based studies that show increased duration, increased effect, greater accuracy, decreased procedural pain, increased response rate, and decreased cost with ultrasound guided versus blind injection.  Verbal informed consent obtained.  Time-out conducted.  Noted no overlying erythema, induration, or other signs of local infection.  Skin prepped in a sterile fashion.  Local anesthesia: Topical Ethyl chloride.  With sterile technique and under real time ultrasound guidance: With a 22-gauge 2 inch needle patient was injected with 4 cc of 0.5% Marcaine and aspirated 30 mL of strawlike fluid. Patient did have injection of 1 mL of Kenalog 40 mg/dL. Completed without difficulty  Pain immediately  resolved suggesting accurate placement of the medication.  Advised to call if fevers/chills, erythema, induration, drainage, or persistent bleeding.  Images permanently stored and available for review in the ultrasound unit.  Impression: Technically successful ultrasound guided injection.  Impression and Recommendations:     This case required medical decision making of moderate complexity.

## 2016-07-05 ENCOUNTER — Other Ambulatory Visit (INDEPENDENT_AMBULATORY_CARE_PROVIDER_SITE_OTHER): Payer: BLUE CROSS/BLUE SHIELD

## 2016-07-05 ENCOUNTER — Other Ambulatory Visit: Payer: BLUE CROSS/BLUE SHIELD

## 2016-07-05 ENCOUNTER — Ambulatory Visit: Payer: Self-pay

## 2016-07-05 ENCOUNTER — Ambulatory Visit (INDEPENDENT_AMBULATORY_CARE_PROVIDER_SITE_OTHER): Payer: BLUE CROSS/BLUE SHIELD | Admitting: Family Medicine

## 2016-07-05 ENCOUNTER — Encounter: Payer: Self-pay | Admitting: Family Medicine

## 2016-07-05 VITALS — BP 130/82 | HR 62 | Ht 69.0 in | Wt 213.0 lb

## 2016-07-05 DIAGNOSIS — M25561 Pain in right knee: Secondary | ICD-10-CM

## 2016-07-05 DIAGNOSIS — M659 Synovitis and tenosynovitis, unspecified: Secondary | ICD-10-CM | POA: Diagnosis not present

## 2016-07-05 DIAGNOSIS — M999 Biomechanical lesion, unspecified: Secondary | ICD-10-CM

## 2016-07-05 DIAGNOSIS — M5030 Other cervical disc degeneration, unspecified cervical region: Secondary | ICD-10-CM | POA: Diagnosis not present

## 2016-07-05 LAB — COMPREHENSIVE METABOLIC PANEL
ALT: 16 U/L (ref 0–53)
AST: 19 U/L (ref 0–37)
Albumin: 4.2 g/dL (ref 3.5–5.2)
Alkaline Phosphatase: 58 U/L (ref 39–117)
BILIRUBIN TOTAL: 0.5 mg/dL (ref 0.2–1.2)
BUN: 13 mg/dL (ref 6–23)
CO2: 28 mEq/L (ref 19–32)
CREATININE: 0.82 mg/dL (ref 0.40–1.50)
Calcium: 9.4 mg/dL (ref 8.4–10.5)
Chloride: 107 mEq/L (ref 96–112)
GFR: 100.74 mL/min (ref >60.00)
GLUCOSE: 101 mg/dL — AB (ref 70–99)
Potassium: 4.4 mEq/L (ref 3.5–5.1)
SODIUM: 140 meq/L (ref 135–145)
Total Protein: 7.2 g/dL (ref 6.0–8.3)

## 2016-07-05 LAB — C-REACTIVE PROTEIN: CRP: 0.8 mg/dL (ref 0.5–20.0)

## 2016-07-05 LAB — VITAMIN D 25 HYDROXY (VIT D DEFICIENCY, FRACTURES): VITD: 20.86 ng/mL — AB (ref 30.00–100.00)

## 2016-07-05 LAB — CBC WITH DIFFERENTIAL/PLATELET
BASOS PCT: 0.5 % (ref 0.0–3.0)
Basophils Absolute: 0 10*3/uL (ref 0.0–0.1)
EOS PCT: 3.2 % (ref 0.0–5.0)
Eosinophils Absolute: 0.2 10*3/uL (ref 0.0–0.7)
HEMATOCRIT: 46.4 % (ref 39.0–52.0)
Hemoglobin: 15.6 g/dL (ref 13.0–17.0)
LYMPHS PCT: 42.1 % (ref 12.0–46.0)
Lymphs Abs: 2 10*3/uL (ref 0.7–4.0)
MCHC: 33.6 g/dL (ref 30.0–36.0)
MCV: 91 fl (ref 78.0–100.0)
Monocytes Absolute: 0.5 10*3/uL (ref 0.1–1.0)
Monocytes Relative: 9.7 % (ref 3.0–12.0)
NEUTROS ABS: 2.2 10*3/uL (ref 1.4–7.7)
Neutrophils Relative %: 44.5 % (ref 43.0–77.0)
PLATELETS: 249 10*3/uL (ref 150.0–400.0)
RBC: 5.11 Mil/uL (ref 4.22–5.81)
RDW: 13.5 % (ref 11.5–15.5)
WBC: 4.8 10*3/uL (ref 4.0–10.5)

## 2016-07-05 LAB — IBC PANEL
Iron: 95 ug/dL (ref 42–165)
SATURATION RATIOS: 25.9 % (ref 20.0–50.0)
Transferrin: 262 mg/dL (ref 212.0–360.0)

## 2016-07-05 LAB — SEDIMENTATION RATE: Sed Rate: 16 mm/hr (ref 0–20)

## 2016-07-05 LAB — CALCIUM, IONIZED: Calcium, Ion: 5.5 mg/dL (ref 4.8–5.6)

## 2016-07-05 LAB — TSH: TSH: 1.63 u[IU]/mL (ref 0.35–4.50)

## 2016-07-05 NOTE — Assessment & Plan Note (Signed)
Patient did have aspiration done today. We'll send him to limp. Patient has been treated previously with 200 mg well-appearing all for potential underlying gout. Laboratory workup today including ruling out autoimmune diseases. We discussed icing regimen. Discussed home exercises. Discussed which activities to do a which ones to avoid. Patient will come back and see me again in 3 weeks. Depending on findings we'll discuss possible further imaging if necessary.

## 2016-07-05 NOTE — Assessment & Plan Note (Signed)
Decision today to treat with OMT was based on Physical Exam  After verbal consent patient was treated with HVLA, ME, FPR techniques in cervical, thoracic, rib lumbar and sacral areas  Patient tolerated the procedure well with improvement in symptoms  Patient given exercises, stretches and lifestyle modifications  See medications in patient instructions if given  Patient will follow up in 3-4 weeks 

## 2016-07-05 NOTE — Addendum Note (Signed)
Addended by: Douglass Rivers T on: 07/05/2016 02:41 PM   Modules accepted: Orders

## 2016-07-05 NOTE — Patient Instructions (Addendum)
Good to see you  Ice is your friend Labs downstairs Continue same meds for now.  See me again in 2-3 weeks.

## 2016-07-05 NOTE — Assessment & Plan Note (Signed)
Stable moment. Responding well to manipulation. Encourage him to continue to work on [employer]. Follow-up again in 3-4 weeks.

## 2016-07-06 LAB — SYNOVIAL CELL COUNT + DIFF, W/ CRYSTALS
BASOPHILS, %: 0 %
EOSINOPHILS-SYNOVIAL: 0 % (ref 0–2)
Lymphocytes-Synovial Fld: 57 % (ref 0–74)
MONOCYTE/MACROPHAGE: 32 % (ref 0–69)
Neutrophil, Synovial: 10 % (ref 0–24)
Synoviocytes, %: 1 % (ref 0–15)
WBC, SYNOVIAL: 135 {cells}/uL (ref <150)

## 2016-07-06 LAB — CYCLIC CITRUL PEPTIDE ANTIBODY, IGG

## 2016-07-06 LAB — RHEUMATOID FACTOR: Rhuematoid fact SerPl-aCnc: <14 IU/mL (ref <14)

## 2016-07-06 LAB — URIC ACID, SYNOVIAL FLUID: URIC ACID, SYNOVIAL FLUID: 3.6 mg/dL (ref 0.0–8.0)

## 2016-07-06 LAB — ANA: Anti Nuclear Antibody(ANA): NEGATIVE

## 2016-07-06 LAB — ANGIOTENSIN CONVERTING ENZYME: Angiotensin-Converting Enzyme: 73 U/L — ABNORMAL HIGH (ref 9–67)

## 2016-07-07 ENCOUNTER — Telehealth: Payer: Self-pay

## 2016-07-07 ENCOUNTER — Other Ambulatory Visit: Payer: Self-pay

## 2016-07-07 DIAGNOSIS — Z8739 Personal history of other diseases of the musculoskeletal system and connective tissue: Secondary | ICD-10-CM

## 2016-07-07 MED ORDER — VITAMIN D (ERGOCALCIFEROL) 1.25 MG (50000 UNIT) PO CAPS: 50000.0000 [IU] | ORAL_CAPSULE | ORAL | 0 refills | Status: DC

## 2016-07-07 NOTE — Telephone Encounter (Signed)
-----   Message from Lyndal Pulley, DO sent at 07/06/2016 10:35 AM EST ----- Most labs are back and look normal. Will need vitamin D which is low.  If not on once weekly dosing we should prescribe.  Otherwise will get back to him.

## 2016-07-07 NOTE — Telephone Encounter (Signed)
Spoke with Mr. Ely per test results. Vitamin D was also sent in for patient.

## 2016-07-10 LAB — BODY FLUID CULTURE
GRAM STAIN: NONE SEEN
Gram Stain: NONE SEEN
ORGANISM ID, BACTERIA: NO GROWTH

## 2016-07-26 ENCOUNTER — Ambulatory Visit (INDEPENDENT_AMBULATORY_CARE_PROVIDER_SITE_OTHER): Payer: BLUE CROSS/BLUE SHIELD | Admitting: Family Medicine

## 2016-07-26 ENCOUNTER — Encounter: Payer: Self-pay | Admitting: Family Medicine

## 2016-07-26 VITALS — BP 114/80 | HR 60 | Ht 69.0 in | Wt 214.0 lb

## 2016-07-26 DIAGNOSIS — M999 Biomechanical lesion, unspecified: Secondary | ICD-10-CM

## 2016-07-26 DIAGNOSIS — G8929 Other chronic pain: Secondary | ICD-10-CM

## 2016-07-26 DIAGNOSIS — M542 Cervicalgia: Secondary | ICD-10-CM | POA: Diagnosis not present

## 2016-07-26 DIAGNOSIS — G44219 Episodic tension-type headache, not intractable: Secondary | ICD-10-CM | POA: Diagnosis not present

## 2016-07-26 NOTE — Assessment & Plan Note (Signed)
Asians headaches seemed to be better and is all that this time. Possible low vitamin D could be contributing. In continue the once weekly. Patient has been able to decrease certain other medications at this time which is been great. Patient will start to increase activity as tolerated. Come back in 4-8 weeks for further evaluation and treatment.

## 2016-07-26 NOTE — Progress Notes (Signed)
Daniel [name] [name], [name] [address] Phone: [phone] Subjective:     CC:  Neck and headache follow-up New left-sided knee pain  [id number]  Daniel [name] is a 66 y.o. male coming in with complaint of   Patient is also having neck pain. Patient is seen neurology. Patient did have an MRI of the cervical spine 12/20/2015 showed moderate cervical disc degenerative changes. Otherwise unremarkable. No true nerve impingement.  Update 07/05/16- Patient states that his neck seems to be a little tight. 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 Patient tries to be active but continues to have some difficulty. Patient denies any numbness though. Patient denies any radiation down the arm. Has responded well to osteopathic manipulation. Just worsening of previous symptoms and no new symptoms.  Update 07/26/16- Patient was found to have a large synovitis of left knee and last exam and was given an injection. Patient was to increase activity slowly. Patient states Her knees and her felt good. Feels like he is making progress. States that everything seems doing better. Feels that the once weekly vitamin D has been the most helpful.  Patient is also stating that his neck seems to be a little tighter. Patient though states that unfortunately continues to have discomfort but no radicular symptoms. Making improvement though overall and has tried to make more of a conscious effort to avoid slipping throughout the day   Past Medical History:  Diagnosis Date  . Acne   . Anxiety   . DDD (degenerative disc disease), lumbosacral    chronic back pain  . Diverticulosis of colon (without mention of hemorrhage)    colo 09/2010  . Low back pain radiating to both legs    Past Surgical History:  Procedure Laterality Date  . CATARACT EXTRACTION Left 07/14/2015  . CYST EXCISION Left 06/23/2015   Procedure: EXCISION MASS LEFT INDEX FINGER;  Surgeon:  Daryll Brod, MD;  Location: [facility];  Service: Orthopedics;  Laterality: Left;  ANESTHESIA: IV REGIONAL/FAB  . HERNIA REPAIR    . REFRACTIVE SURGERY     left eye/ with enhancement  . TONSILLECTOMY     Social History  Substance Use Topics  . Smoking status: Never Smoker  . Smokeless tobacco: Never Used  . Alcohol use Yes     Comment: weekly   Allergies  Allergen Reactions  . Durezol [Difluprednate]     Eye drops-swelling   Family History  Problem Relation Age of Onset  . Diabetes Mother   . Hypertension Mother   . Arthritis Mother   . Arthritis Father   . Heart disease Father   . Cancer Neg Hx   . Stroke Neg Hx   . Kidney disease Neg Hx   . Early death Neg Hx   . Diabetes Sister     Past medical history, social, surgical and family history all reviewed in electronic medical record.   Review of Systems: No headache, visual changes, nausea, vomiting, diarrhea, constipation, dizziness, abdominal pain, skin rash, fevers, chills, night sweats, weight loss, swollen lymph nodes, body aches, joint swelling,  chest pain, shortness of breath, mood changes.   Still positive for muscle aches but no joint swelling  Objective  Blood pressure 114/80, pulse 60, height 5\' 9"  (1.753 m), weight 214 lb (97.1 kg), SpO2 98 %.  Systems examined below as of 07/26/16 General: NAD A&O x3 mood, affect normal  HEENT: Pupils equal, extraocular movements intact  no nystagmus Respiratory: not short of breath at rest or with speaking Cardiovascular: No lower extremity edema, non tender Skin: Warm dry intact with no signs of infection or rash on extremities or on axial skeleton. Abdomen: Soft nontender, no masses Neuro: Cranial nerves  intact, neurovascularly intact in all extremities with 2+ DTRs and 2+ pulses. Lymph: No lymphadenopathy appreciated today  Gait normal with good balance and coordination.  MSK: Non tender with full range of motion and good stability and symmetric  strength and tone of shoulders, elbows, wrist,  hips and ankles bilaterally.     Bilateral knee exam shows full range of motion which patient effusion bilaterally. Nontender on exam. No significant instability   Neck: Inspection unremarkable. No palpable stepoffs. Continued tightness noted. Negative Spurling's. Grip strength and sensation normal in bilateral hands Strength good C4 to T1 distribution No sensory change to C4 to T1 Negative Hoffman sign bilaterally Reflexes normal   Osteopathic findings Cervical C7 flexed rotated and side bent left T3 extended rotated and side bent right inhaled third rib T5 extended rotated and side bent left L2 flexed rotated and side bent right Sacrum right on right       Impression and Recommendations:     This case required medical decision making of moderate complexity.

## 2016-07-26 NOTE — Assessment & Plan Note (Signed)
Decision today to treat with OMT was based on Physical Exam  After verbal consent patient was treated with HVLA, ME, FPR techniques in cervical, thoracic, rib,  lumbar and sacral areas  Patient tolerated the procedure well with improvement in symptoms  Patient given exercises, stretches and lifestyle modifications  See medications in patient instructions if given  Patient will follow up in 4-8 weeks 

## 2016-07-26 NOTE — Patient Instructions (Signed)
-   6 weeks You are doing great

## 2016-08-08 ENCOUNTER — Ambulatory Visit: Payer: BLUE CROSS/BLUE SHIELD | Admitting: Neurology

## 2016-08-15 ENCOUNTER — Ambulatory Visit: Payer: BLUE CROSS/BLUE SHIELD | Admitting: Neurology

## 2016-09-18 NOTE — Progress Notes (Signed)
[name] [address] [place], [place] [address] Phone: [phone] Subjective:     CC:  Neck and headache follow-up New left-sided knee pain  [id number]  Daniel [name] is a 66 y.o. male coming in with complaint of   Patient is also having neck pain. Patient is seen neurology. Patient did have an MRI of the cervical spine 12/20/2015 showed moderate cervical disc degenerative changes. Otherwise unremarkable. No true nerve impingement.  Update 07/05/16- Patient states that his neck seems to be a little tight. 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 Patient tries to be active but continues to have some difficulty. Patient denies any numbness though. Patient denies any radiation down the arm. Has responded well to osteopathic manipulation. Just worsening of previous symptoms and no new symptoms.  April 30th 2018 update-patient states that he is having some increasing right upper back and neck pain. Has had significant amount of stressors in his leg. Patient's mother is going to an assisted living facility. 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 Also had a do a lot of manual labor recently.   Past Medical History:  Diagnosis Date  . Acne   . Anxiety   . DDD (degenerative disc disease), lumbosacral    chronic back pain  . Diverticulosis of colon (without mention of hemorrhage)    colo 09/2010  . Low back pain radiating to both legs    Past Surgical History:  Procedure Laterality Date  . CATARACT EXTRACTION Left 07/14/2015  . CYST EXCISION Left 06/23/2015   Procedure: EXCISION MASS LEFT INDEX FINGER;  Surgeon: Daryll Brod, MD;  Location: [facility];  Service: Orthopedics;  Laterality: Left;  ANESTHESIA: IV REGIONAL/FAB  . HERNIA REPAIR    . REFRACTIVE SURGERY     left eye/ with  enhancement  . TONSILLECTOMY     Social History  Substance Use Topics  . Smoking status: Never Smoker  . Smokeless tobacco: Never Used  . Alcohol use Yes     Comment: weekly   Allergies  Allergen Reactions  . Durezol [Difluprednate]     Eye drops-swelling   Family History  Problem Relation Age of Onset  . Diabetes Mother   . Hypertension Mother   . Arthritis Mother   . Arthritis Father   . Heart disease Father   . Diabetes Sister   . Cancer Neg Hx   . Stroke Neg Hx   . Kidney disease Neg Hx   . Early death Neg Hx     Past medical history, social, surgical and family history all reviewed in electronic medical record.   Review of Systems: No headache, visual changes, nausea, vomiting, diarrhea, constipation, dizziness, abdominal pain, skin rash, fevers, chills, night sweats, weight loss, swollen lymph nodes, body aches, joint swelling, muscle aches, chest pain, shortness of breath, mood changes.    Objective  Blood pressure 126/84, pulse (!) 55, resp. rate 16, weight 209 lb 4 oz (94.9 kg), SpO2 97 %.  Systems examined below as of 09/19/16 General: NAD A&O x3 mood, affect normal  HEENT: Pupils equal, extraocular movements intact no nystagmus Respiratory: not short of breath at rest or with speaking Cardiovascular: No lower extremity edema, non tender Skin: Warm dry intact with no signs of infection or rash on extremities or on  axial skeleton. Abdomen: Soft nontender, no masses Neuro: Cranial nerves  intact, neurovascularly intact in all extremities with 2+ DTRs and 2+ pulses. Lymph: No lymphadenopathy appreciated today  Gait normal with good balance and coordination.  MSK: Non tender with full range of motion and good stability and symmetric strength and tone of shoulders, elbows, wrist,  knee hips and ankles bilaterally.    Neck: Inspection unremarkable. No palpable stepoffs. Negative Spurling's maneuver. Significant tightness of the right side of the neck. Patient  is severely tender to palpation in the paraspinal musculature around the scapula medially. Right side only. Grip strength and sensation normal in bilateral hands Strength good C4 to T1 distribution No sensory change to C4 to T1 Negative Hoffman sign bilaterally Reflexes normal   Osteopathic findings Cervical C2 flexed rotated and side bent right C4 flexed rotated and side bent left C6 flexed rotated and side bent left T2 extended rotated and side bent right inhaled rib T9 extended rotated and side bent left L2 flexed rotated and side bent right Sacrum right on right        Impression and Recommendations:     This case required medical decision making of moderate complexity.

## 2016-09-19 ENCOUNTER — Ambulatory Visit (INDEPENDENT_AMBULATORY_CARE_PROVIDER_SITE_OTHER): Payer: BLUE CROSS/BLUE SHIELD | Admitting: Family Medicine

## 2016-09-19 ENCOUNTER — Encounter: Payer: Self-pay | Admitting: Family Medicine

## 2016-09-19 VITALS — BP 126/84 | HR 55 | Resp 16 | Wt 209.2 lb

## 2016-09-19 DIAGNOSIS — M5030 Other cervical disc degeneration, unspecified cervical region: Secondary | ICD-10-CM

## 2016-09-19 DIAGNOSIS — M999 Biomechanical lesion, unspecified: Secondary | ICD-10-CM

## 2016-09-19 NOTE — Assessment & Plan Note (Signed)
Stable overall. Patient has known degenerative disc disease but likely not contributing to most of the pain. I do think there is more stress. Refill patient's hydroxyzine. We discussed icing regimen and home exercises. We discussed which activities doing which was to avoid. Patient will come back and see me again in 3-4 weeks for further evaluation and treatment.

## 2016-09-19 NOTE — Assessment & Plan Note (Addendum)
Decision today to treat with OMT was based on Physical Exam  After verbal consent patient was treated with HVLA, ME, FPR techniques in cervical, thoracic, rib, lumbar and sacral areas  Patient tolerated the procedure well with improvement in symptoms  Patient given exercises, stretches and lifestyle modifications  See medications in patient instructions if given  Patient will follow up in 4-6 weeks 

## 2016-09-19 NOTE — Patient Instructions (Signed)
Good to see you  Ice when you need it but can do heat for 20 minutes before Hydroxyzine could be great up to 3 times a day  See me again in 3-4 weeks.

## 2016-09-30 ENCOUNTER — Other Ambulatory Visit: Payer: Self-pay | Admitting: Family Medicine

## 2016-10-18 ENCOUNTER — Ambulatory Visit (INDEPENDENT_AMBULATORY_CARE_PROVIDER_SITE_OTHER): Payer: BLUE CROSS/BLUE SHIELD | Admitting: Family Medicine

## 2016-10-18 ENCOUNTER — Encounter: Payer: Self-pay | Admitting: Family Medicine

## 2016-10-18 VITALS — BP 116/80 | HR 58 | Ht 69.0 in | Wt 207.0 lb

## 2016-10-18 DIAGNOSIS — M999 Biomechanical lesion, unspecified: Secondary | ICD-10-CM | POA: Diagnosis not present

## 2016-10-18 DIAGNOSIS — M7061 Trochanteric bursitis, right hip: Secondary | ICD-10-CM

## 2016-10-18 DIAGNOSIS — M5137 Other intervertebral disc degeneration, lumbosacral region: Secondary | ICD-10-CM | POA: Diagnosis not present

## 2016-10-18 NOTE — Assessment & Plan Note (Signed)
Continues to have some mild tightness. Seems to be more of the neck recently in the upper back. Discussed ergonomics and posture. Patient will continue to be active. We discussed with patient about proper lifting mechanics. Patient is coming continue to try to lose weight. Follow-up again in 4 weeks.

## 2016-10-18 NOTE — Patient Instructions (Addendum)
Great to see you Daniel [name] is your friend.  Stay active  Posture is key  New exercises on the outside of your hip  pennsaid pinkie amount topically 2 times daily as needed.  Try the zanaflex (the pill ) at night See me again in 3-4 weeks.

## 2016-10-18 NOTE — Assessment & Plan Note (Signed)
Decision today to treat with OMT was based on Physical Exam  After verbal consent patient was treated with HVLA, ME, FPR techniques in cervical, thoracic, lumbar and sacral areas  Patient tolerated the procedure well with improvement in symptoms  Patient given exercises, stretches and lifestyle modifications  See medications in patient instructions if given  Patient will follow up in 4 weeks 

## 2016-10-18 NOTE — Assessment & Plan Note (Signed)
Discussed hip abductor strengthening, home exercises, topical anti-inflammatories and refill of muscle relaxant. Follow-up in 3-4 weeks if not improved injection.

## 2016-10-18 NOTE — Progress Notes (Signed)
[name] [address] [place], [place] [address] Phone: [phone] Subjective:     CC:  Neck and headache follow-up left-sided knee pain f/u  [id number]  Daniel [name] is a 66 y.o. male coming in with complaint of   Patient is also having neck pain. Patient is seen neurology. Patient did have an MRI of the cervical spine 12/20/2015 showed moderate cervical disc degenerative changes. Otherwise unremarkable. No true nerve impingement.  Update 07/05/16- Patient states that his neck seems to be a little tight. 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 Patient tries to be active but continues to have some difficulty. Patient denies any numbness though. Patient denies any radiation down the arm. Has responded well to osteopathic manipulation. Just worsening of previous symptoms and no new symptoms.  April 30th 2018 update-patient states that he is having some increasing right upper back and neck pain. Has had significant amount of stressors in his leg. Patient's mother is going to an assisted living facility. 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 Also had a do a lot of manual labor recently.  Update 10/18/2016-patient continued to have tightness of the neck in the upper back. Patient denies any significant radiation. Has been doing the exercises intermittently. States that he feels like he is doing relatively well.  Patient has had knee problem previously. Has had significant amount of inflammation but states right leg pain more on the lateral aspect of the knee leg. States that it's only at night. Waking him up. Rates the severity of pain a 7 out of 10. Stiffness in the morning but gets better with activity. Minimal radiation down the leg. Not always associated with the back pain.    Past Medical  History:  Diagnosis Date  . Acne   . Anxiety   . DDD (degenerative disc disease), lumbosacral    chronic back pain  . Diverticulosis of colon (without mention of hemorrhage)    colo 09/2010  . Low back pain radiating to both legs    Past Surgical History:  Procedure Laterality Date  . CATARACT EXTRACTION Left 07/14/2015  . CYST EXCISION Left 06/23/2015   Procedure: EXCISION MASS LEFT INDEX FINGER;  Surgeon: Daryll Brod, MD;  Location: [facility];  Service: Orthopedics;  Laterality: Left;  ANESTHESIA: IV REGIONAL/FAB  . HERNIA REPAIR    . REFRACTIVE SURGERY     left eye/ with enhancement  . TONSILLECTOMY     Social History  Substance Use Topics  . Smoking status: Never Smoker  . Smokeless tobacco: Never Used  . Alcohol use Yes     Comment: weekly   Allergies  Allergen Reactions  . Durezol [Difluprednate]     Eye drops-swelling   Family History  Problem Relation Age of Onset  . Diabetes Mother   . Hypertension Mother   . Arthritis Mother   . Arthritis Father   . Heart disease Father   . Diabetes Sister   . Cancer Neg Hx   . Stroke Neg Hx   . Kidney disease Neg Hx   . Early death Neg Hx     Past medical history, social, surgical and family history all reviewed in electronic medical record.   Review of Systems: No headache, visual changes, nausea, vomiting, diarrhea, constipation, dizziness, abdominal pain,  skin rash, fevers, chills, night sweats, weight loss, swollen lymph nodes, body aches, joint swelling, muscle aches, chest pain, shortness of breath, mood changes.    Objective  Blood pressure 116/80, pulse (!) 58, height 5\' 9"  (1.753 m), weight 207 lb (93.9 kg), SpO2 95 %.  Systems examined below as of 10/18/16 General: NAD A&O x3 mood, affect normal  HEENT: Pupils equal, extraocular movements intact no nystagmus Respiratory: not short of breath at rest or with speaking Cardiovascular: No lower extremity edema, non tender Skin: Warm dry intact with  no signs of infection or rash on extremities or on axial skeleton. Abdomen: Soft nontender, no masses Neuro: Cranial nerves  intact, neurovascularly intact in all extremities with 2+ DTRs and 2+ pulses. Lymph: No lymphadenopathy appreciated today  Gait normal with good balance and coordination.  MSK: Non tender with full range of motion and good stability and symmetric strength and tone of shoulders, elbows, wrist,  and ankles bilaterally.    Neck: Inspection unremarkable. No palpable stepoffs. Negative Spurling's maneuver. Significant tightness of the right side of the neck. Mild tenderness still noted Grip strength and sensation normal in bilateral hands Strength good C4 to T1 distribution No sensory change to C4 to T1 Negative Hoffman sign bilaterally Reflexes normal  Lateral aspect the patient's right hip is tender to palpation. Right over the greater trochanteric area. Patient has a positive Faber test. No pain in the groin with internal rotation. Negative straight leg test.   Osteopathic findings C2 flexed rotated and side bent right C4 flexed rotated and side bent left C7 flexed rotated and side bent left T3 extended rotated and side bent right inhaled third rib T8 extended rotated and side bent left L2 flexed rotated and side bent right Sacrum left on left       Impression and Recommendations:     This case required medical decision making of moderate complexity.

## 2016-10-30 ENCOUNTER — Inpatient Hospital Stay (HOSPITAL_COMMUNITY)
Admission: AD | Admit: 2016-10-30 | Discharge: 2016-11-01 | DRG: 303 | Disposition: A | Discharge status: Home / Self Care | Payer: BLUE CROSS/BLUE SHIELD | Source: Other Acute Inpatient Hospital | Attending: Internal Medicine | Admitting: Internal Medicine

## 2016-10-30 ENCOUNTER — Inpatient Hospital Stay (HOSPITAL_COMMUNITY): Payer: BLUE CROSS/BLUE SHIELD

## 2016-10-30 DIAGNOSIS — M545 Low back pain: Secondary | ICD-10-CM | POA: Diagnosis present

## 2016-10-30 DIAGNOSIS — E669 Obesity, unspecified: Secondary | ICD-10-CM | POA: Diagnosis present

## 2016-10-30 DIAGNOSIS — R0789 Other chest pain: Secondary | ICD-10-CM | POA: Diagnosis not present

## 2016-10-30 DIAGNOSIS — I493 Ventricular premature depolarization: Secondary | ICD-10-CM | POA: Diagnosis not present

## 2016-10-30 DIAGNOSIS — Z79899 Other long term (current) drug therapy: Secondary | ICD-10-CM

## 2016-10-30 DIAGNOSIS — I25119 Atherosclerotic heart disease of native coronary artery with unspecified angina pectoris: Secondary | ICD-10-CM | POA: Diagnosis present

## 2016-10-30 DIAGNOSIS — I4891 Unspecified atrial fibrillation: Secondary | ICD-10-CM | POA: Diagnosis not present

## 2016-10-30 DIAGNOSIS — I480 Paroxysmal atrial fibrillation: Secondary | ICD-10-CM | POA: Diagnosis present

## 2016-10-30 DIAGNOSIS — R079 Chest pain, unspecified: Secondary | ICD-10-CM

## 2016-10-30 DIAGNOSIS — Z6829 Body mass index (BMI) 29.0-29.9, adult: Secondary | ICD-10-CM

## 2016-10-30 DIAGNOSIS — I2510 Atherosclerotic heart disease of native coronary artery without angina pectoris: Principal | ICD-10-CM | POA: Diagnosis present

## 2016-10-30 DIAGNOSIS — F419 Anxiety disorder, unspecified: Secondary | ICD-10-CM | POA: Diagnosis present

## 2016-10-30 DIAGNOSIS — G8929 Other chronic pain: Secondary | ICD-10-CM | POA: Diagnosis present

## 2016-10-30 DIAGNOSIS — Z833 Family history of diabetes mellitus: Secondary | ICD-10-CM

## 2016-10-30 DIAGNOSIS — Z888 Allergy status to other drugs, medicaments and biological substances status: Secondary | ICD-10-CM | POA: Diagnosis not present

## 2016-10-30 DIAGNOSIS — Z8249 Family history of ischemic heart disease and other diseases of the circulatory system: Secondary | ICD-10-CM

## 2016-10-30 DIAGNOSIS — Z8679 Personal history of other diseases of the circulatory system: Secondary | ICD-10-CM

## 2016-10-30 HISTORY — DX: Personal history of other diseases of the circulatory system: Z86.79

## 2016-10-30 LAB — ECHOCARDIOGRAM COMPLETE
CHL CUP RV SYS PRESS: 24 mmHg
CHL CUP TV REG PEAK VELOCITY: 228 cm/s
FS: 38 % (ref 28–44)
HEIGHTINCHES: 69 in
IVS/LV PW RATIO, ED: 1.2
LA diam end sys: 36 mm
LA vol index: 21.8 mL/m2
LADIAMINDEX: 1.73 cm/m2
LASIZE: 36 mm
LAVOL: 45.4 mL
LAVOLA4C: 46.6 mL
LDCA: 3.14 cm2
LV PW d: 10 mm — AB (ref 0.6–1.1)
LVOT diameter: 20 mm
TR max vel: 228 cm/s
Weight: 3252.8 oz

## 2016-10-30 LAB — BASIC METABOLIC PANEL
ANION GAP: 6 (ref 5–15)
BUN: 13 mg/dL (ref 6–20)
CHLORIDE: 106 mmol/L (ref 101–111)
CO2: 26 mmol/L (ref 22–32)
CREATININE: 0.91 mg/dL (ref 0.61–1.24)
Calcium: 8.5 mg/dL — ABNORMAL LOW (ref 8.9–10.3)
GFR calc Af Amer: >60 mL/min (ref >60)
GFR calc non Af Amer: >60 mL/min (ref >60)
Glucose, Bld: 107 mg/dL — ABNORMAL HIGH (ref 65–99)
POTASSIUM: 3.6 mmol/L (ref 3.5–5.1)
SODIUM: 138 mmol/L (ref 135–145)

## 2016-10-30 LAB — TROPONIN I: Troponin I: <0.03 ng/mL (ref <0.03)

## 2016-10-30 LAB — NM MYOCAR MULTI W/SPECT W/WALL MOTION / EF
MPHR: 157 {beats}/min
Peak HR: 91 {beats}/min
Percent HR: 57 %
Rest HR: 59 {beats}/min

## 2016-10-30 LAB — CBC
HCT: 40.3 % (ref 39.0–52.0)
Hemoglobin: 13.4 g/dL (ref 13.0–17.0)
MCH: 30.5 pg (ref 26.0–34.0)
MCHC: 33.3 g/dL (ref 30.0–36.0)
MCV: 91.6 fL (ref 78.0–100.0)
PLATELETS: 200 10*3/uL (ref 150–400)
RBC: 4.4 MIL/uL (ref 4.22–5.81)
RDW: 14 % (ref 11.5–15.5)
WBC: 5.4 10*3/uL (ref 4.0–10.5)

## 2016-10-30 LAB — TSH: TSH: 0.833 u[IU]/mL (ref 0.350–4.500)

## 2016-10-30 LAB — CREATININE, SERUM
CREATININE: 0.84 mg/dL (ref 0.61–1.24)
GFR calc Af Amer: >60 mL/min (ref >60)

## 2016-10-30 MED ORDER — TECHNETIUM TC 99M TETROFOSMIN IV KIT
30.0000 | PACK | Freq: Once | INTRAVENOUS | Status: AC | PRN
  Administered 2016-10-30: 30 via INTRAVENOUS

## 2016-10-30 MED ORDER — DILTIAZEM LOAD VIA INFUSION
10.0000 mg | Freq: Once | INTRAVENOUS | Status: AC
  Administered 2016-10-30: 10 mg via INTRAVENOUS
  Filled 2016-10-30: qty 10

## 2016-10-30 MED ORDER — TECHNETIUM TC 99M TETROFOSMIN IV KIT
10.0000 | PACK | Freq: Once | INTRAVENOUS | Status: AC | PRN
  Administered 2016-10-30: 10 via INTRAVENOUS

## 2016-10-30 MED ORDER — ACETAMINOPHEN 325 MG PO TABS
650.0000 mg | ORAL_TABLET | ORAL | Status: DC | PRN
  Administered 2016-10-30: 650 mg via ORAL
  Filled 2016-10-30: qty 2

## 2016-10-30 MED ORDER — ONDANSETRON HCL 4 MG/2ML IJ SOLN
4.0000 mg | Freq: Four times a day (QID) | INTRAMUSCULAR | Status: DC | PRN
Start: 2016-10-30 — End: 2016-11-01

## 2016-10-30 MED ORDER — DEXTROSE 5 % IV SOLN
5.0000 mg/h | INTRAVENOUS | Status: DC
  Administered 2016-10-30 – 2016-10-31 (×2): 5 mg/h via INTRAVENOUS
  Filled 2016-10-30 (×2): qty 100

## 2016-10-30 MED ORDER — REGADENOSON 0.4 MG/5ML IV SOLN
INTRAVENOUS | Status: AC
  Filled 2016-10-30: qty 5

## 2016-10-30 MED ORDER — HEPARIN SODIUM (PORCINE) 5000 UNIT/ML IJ SOLN
5000.0000 [IU] | Freq: Three times a day (TID) | INTRAMUSCULAR | Status: DC
  Administered 2016-10-30 – 2016-11-01 (×7): 5000 [IU] via SUBCUTANEOUS
  Filled 2016-10-30 (×7): qty 1

## 2016-10-30 MED ORDER — REGADENOSON 0.4 MG/5ML IV SOLN
0.4000 mg | Freq: Once | INTRAVENOUS | Status: AC
  Administered 2016-10-30: 0.4 mg via INTRAVENOUS
  Filled 2016-10-30: qty 5

## 2016-10-30 NOTE — Discharge Summary (Signed)
Discharge Summary    Patient ID: Daniel [name]  MRN: [id number], DOB/AGE: 1951/01/31 66 y.o.  Admit Date: 10/30/2016 Discharge Date: 11/01/2016  Primary Care Provider: Janith Lima, MD Primary Cardiologist: New - consult by Lovena Le  Discharge Diagnoses    Active Problems:   Chest pain due to coronary artery disease   PAF (paroxysmal atrial fibrillation) (HCC)   Allergies Allergies  Allergen Reactions  . Durezol [Difluprednate]     Eye drops-swelling     History of Present Illness     66 year old male without any previously known cardiac history, though with history of anxiety, DDD with chronic low back pain, diverticulosis, and obesity who presented to [facility] on 10/30/16 as a transfer from [facility] for chest pain with associated SOB. He does not have any family history of CAD and has never smoked tobacco. At [facility] ED he was given NTG with improvement in his chest pain. He was also given IV Lovenox and ASA, and transferred to [facility] for further evaluation and testing.   Hospital Course     Consultants: none.   Upon his arrival to [facility], his vital signs remained stable with a mildly bradycardic heart rate. His oxygen saturation remained stable in the upper 90s on room air. He was afebrile. He did not have any evdience of ischemia on evaluation, and he was chest pain free with NTG. EKG showed sinus bradycardia, 56 bpm, nonspecific st/t changes. Troponin negative x 2. CBC unremarkable. SCr 0.91, K+ 3.6. His chest pain was felt to be mixed typical and with atypical features.   He underwent a Lexiscan myoview that showed normal resting and stress perfusion, no ischemia or infarction EF 61%.. Just prior to discharge the patient develop rapid atrial fibrillation confirmed on EKG with a heart rate of 116 and shortness of breath . He was started on IV diltiazem. He converted with this therapy and did not have any reoccurrence of his atrial fibrillation. A CT angio of the  chest was ordered to rule out pulmonary embolism and this was negative. He also had a normal TSH. His CHADVASc score is 0 and was therefore not started on anticoagulation. He was started on Metoprolol 25 mg BID and monitored overnight with no adverse events.  The patient has been seen by Dr. Acie Fredrickson, MD and felt to be stable for discharge today. Staff message has been sent to the office for follow up. Discharge medications are listed below. Prescriptions have been reviewed with the patient and sent in to their pharmacy.  _____________  Discharge Vitals Blood pressure 118/82, pulse (!) 59, temperature 98.4 F (36.9 C), temperature source Oral, resp. rate (!) 22, height 5\' 9"  (1.753 m), weight 200 lb (90.7 kg), SpO2 94 %.  Filed Weights   10/30/16 0124 10/31/16 0517 11/01/16 0457  Weight: 203 lb 4.8 oz (92.2 kg) 200 lb 1.6 oz (90.8 kg) 200 lb (90.7 kg)    Labs & Radiologic Studies    CBC  Recent Labs  10/30/16 0729  WBC 5.4  HGB 13.4  HCT 40.3  MCV 91.6  PLT 379   Basic Metabolic Panel  Recent Labs  10/30/16 0213 10/30/16 0729  NA 138  --   K 3.6  --   CL 106  --   CO2 26  --   GLUCOSE 107*  --   BUN 13  --   CREATININE 0.91 0.84  CALCIUM 8.5*  --    Liver Function Tests No results for  input(s): AST, ALT, ALKPHOS, BILITOT, PROT, ALBUMIN in the last 72 hours. No results for input(s): LIPASE, AMYLASE in the last 72 hours. Cardiac Enzymes  Recent Labs  10/30/16 0213 10/30/16 0729 10/30/16 1350  TROPONINI <0.03 <0.03 <0.03   BNP Invalid input(s): POCBNP D-Dimer No results for input(s): DDIMER in the last 72 hours. Hemoglobin A1C No results for input(s): HGBA1C in the last 72 hours. Fasting Lipid Panel No results for input(s): CHOL, HDL, LDLCALC, TRIG, CHOLHDL, LDLDIRECT in the last 72 hours. Thyroid Function Tests  Recent Labs  10/30/16 1426  TSH 0.833   _____________  Ct Angio Chest Pe W Or Wo Contrast  Result Date: 10/31/2016 CLINICAL DATA:  Mid chest  pain and shortness of breath beginning 2 days ago now resolved. EXAM: CT ANGIOGRAPHY CHEST WITH CONTRAST TECHNIQUE: Multidetector CT imaging of the chest was performed using the standard protocol during bolus administration of intravenous contrast. Multiplanar CT image reconstructions and MIPs were obtained to evaluate the vascular anatomy. CONTRAST:  100 mL Isovue 370 IV. COMPARISON:  Abdominopelvic CT 07/28/2015 FINDINGS: Cardiovascular: Mild prominence of the heart. Minimal calcified plaque over the thoracic aorta. No evidence of pulmonary embolism. Mediastinum/Nodes: No evidence of mediastinal or hilar adenopathy. Remaining mediastinal structures are within normal. Lungs/Pleura: Lungs are adequately inflated without airspace consolidation or effusion. Airways are within normal. Upper Abdomen: Within normal. Musculoskeletal: Minimal degenerate change of the spine. Prominent Schmorl's node over 2 adjacent lower thoracic vertebral bodies. Hemangioma over a mid thoracic vertebral body. Review of the MIP images confirms the above findings. IMPRESSION: No acute cardiopulmonary disease. No evidence of pulmonary embolism. Electronically Signed   By: Marin Olp M.D.   On: 10/31/2016 16:57   Nm Myocar Multi W/spect W/wall Motion / Ef  Result Date: 10/30/2016  The study is normal.  This is a low risk study.  The left ventricular ejection fraction is normal (55-65%).  Normal resting and stress perfusion. No ischemia or infarction EF EF 61%    Diagnostic Studies/Procedures   Lexiscan Myoview 10/30/2016:  Study Result     The study is normal.  This is a low risk study.  The left ventricular ejection fraction is normal (55-65%).   Normal resting and stress perfusion. No ischemia or infarction EF EF 61%      _____________  Disposition   Pt is being discharged home today in good condition.  Follow-up Plans & Appointments    Follow-up Information    Evans Lance, MD Follow up.     Specialty:  Cardiology Why:  A staff message has been sent to the office for hospital follow up in 10-14 days. If he has not heard from our office by 11/02/16, he is to call our office at [phone] to schedule an appointment.  Contact information: [address] [place] [address] [phone]          Discharge Instructions    Diet - low sodium heart healthy    Complete by:  As directed    Increase activity slowly    Complete by:  As directed    May shower / Bathe    Complete by:  As directed       Discharge Medications   Current Discharge Medication List    START taking these medications   Details  metoprolol tartrate (LOPRESSOR) 25 MG tablet Take 1 tablet (25 mg total) by mouth 2 (two) times daily. Qty: 60 tablet, Refills: 3      CONTINUE these  medications which have NOT CHANGED   Details  acetaminophen (TYLENOL) 500 MG tablet Take 1,000 mg by mouth every 6 (six) hours as needed for mild pain.    allopurinol (ZYLOPRIM) 100 MG tablet Take 2 tablets (200 mg total) by mouth daily. Qty: 60 tablet, Refills: 6    amitriptyline (ELAVIL) 10 MG tablet Take 1 tablet (10 mg total) by mouth at bedtime. Qty: 90 tablet, Refills: 3    Diclofenac Sodium (PENNSAID) 2 % SOLN Place 2 application onto the skin 2 (two) times daily. Qty: 112 g, Refills: 3    hydrOXYzine (ATARAX/VISTARIL) 10 MG tablet Take 1 tablet (10 mg total) by mouth 3 (three) times daily as needed. Qty: 270 tablet, Refills: 1    Multiple Vitamins-Minerals (MULTIVITAMIN PO) Take 1 tablet by mouth at bedtime.     tiZANidine (ZANAFLEX) 2 MG tablet Take 2 mg by mouth 3 (three) times daily as needed for muscle spasms.    Vitamin D, Ergocalciferol, (DRISDOL) 50000 units CAPS capsule TAKE 1 CAPSULE (50,000 UNITS TOTAL) BY MOUTH EVERY 7 (SEVEN) DAYS. Qty: 12 capsule, Refills: 0      STOP taking these medications     doxycycline (VIBRAMYCIN) 50 MG capsule        Outstanding Labs/Studies    None.  Duration of Discharge Encounter   Greater than 30 minutes including physician time.  Signed, Sindy Messing [place] Pager: [phone] 11/01/2016, 12:28 PM  Attending Note:   The patient was seen and examined.  Agree with assessment and plan as noted above.  Changes made to the above note as needed.  Patient seen and independently examined with Delos Haring, PA .   We discussed all aspects of the encounter. I agree with the assessment and plan as stated above.  1. See progress note from earlier today  Stable for DC    I have spent a total of 40 minutes with patient reviewing hospital  notes , telemetry, EKGs, labs and examining patient as well as establishing an assessment and plan that was discussed with the patient. > 50% of time was spent in direct patient care.    Thayer Headings, Brooke Bonito., MD, [facility] 11/01/2016, 4:51 PM [address]  [place] Pager [phone]

## 2016-10-30 NOTE — Progress Notes (Signed)
  Echocardiogram 2D Echocardiogram has been performed.  Daniel [name] 10/30/2016, 5:10 PM

## 2016-10-30 NOTE — Progress Notes (Signed)
     The patient was seen in nuclear medicine for a lexiscan myoview. He tolerated the procedure well. No acute ST or TW changes on ECG. Await nuclear images.    Ignacia Gentzler Stern PA-C  [facility]    

## 2016-10-30 NOTE — Progress Notes (Signed)
Notified by Kathlene November pt in afib  Pt complaining of SOB  Full sensation. He said this is what made him go to ER at [facility]  EKG there showed SB I wonder if he converted before arrival He deneis palpitations ever, even now.  On exam, pt sl anxious   Lungs rel clear Cardiac Irreg irreg  Plan:  Will give IV dilt to slow  Pt may convert  If does not work would give Flecanide and follow  May convert Continue tele Recom echo here or as outpt Check TSH  If does not convert then anticoagulation and pssible TEE/Cardioversion tomorrow Pt's CHADSVASc score is 0.  Dorris Carnes

## 2016-10-30 NOTE — Progress Notes (Addendum)
    Myoview came back low risk, EF normal. Plan was originally for discharge but just called by RN for rapid afib. He is having some SOB. Will start dilt gtt. BP has been normal but mildly elevated during stress test. CHASDVASC of 0. Can hold off on heparin for now. Will order TSH and 2D ECHO. If he does not convert on dilt we can think about pill in the pocket flecainide.   Angelena Form PA-C  [facility]

## 2016-10-30 NOTE — H&P (Signed)
Daniel [name] is an 66 y.o. male.   Chief Complaint: chest pain HPI: The patient is a pleasant 66 yo man with no significant cardiac history who presents in transfer from [facility] with chest pain. He had no objective evidence of ischemia and his pain resolved with ntg. He had associated sob. No syncope. He denies a family h/o CAD and does not smoke. The pain did not radiate to his arms and was not associated with nausea or vomiting. He was given IV lovenox and ASA. His cardiac enzymes are negative.  Past Medical History:  Diagnosis Date  . Acne   . Anxiety   . DDD (degenerative disc disease), lumbosacral    chronic back pain  . Diverticulosis of colon (without mention of hemorrhage)    colo 09/2010  . Low back pain radiating to both legs     Past Surgical History:  Procedure Laterality Date  . CATARACT EXTRACTION Left 07/14/2015  . CYST EXCISION Left 06/23/2015   Procedure: EXCISION MASS LEFT INDEX FINGER;  Surgeon: Daryll Brod, MD;  Location: [facility];  Service: Orthopedics;  Laterality: Left;  ANESTHESIA: IV REGIONAL/FAB  . HERNIA REPAIR    . REFRACTIVE SURGERY     left eye/ with enhancement  . TONSILLECTOMY      Family History  Problem Relation Age of Onset  . Diabetes Mother   . Hypertension Mother   . Arthritis Mother   . Arthritis Father   . Heart disease Father   . Diabetes Sister   . Cancer Neg Hx   . Stroke Neg Hx   . Kidney disease Neg Hx   . Early death Neg Hx    Social History:  reports that he has never smoked. He has never used smokeless tobacco. He reports that he drinks alcohol. He reports that he does not use drugs.  Allergies:  Allergies  Allergen Reactions  . Durezol [Difluprednate]     Eye drops-swelling    Medications Prior to Admission  Medication Sig Dispense Refill  . allopurinol (ZYLOPRIM) 100 MG tablet Take 2 tablets (200 mg total) by mouth daily. 60 tablet 6  . amitriptyline (ELAVIL) 10 MG tablet Take 1  tablet (10 mg total) by mouth at bedtime. 90 tablet 3  . Diclofenac Sodium (PENNSAID) 2 % SOLN Place 2 application onto the skin 2 (two) times daily. 112 g 3  . doxycycline (VIBRAMYCIN) 50 MG capsule   1  . hydrOXYzine (ATARAX/VISTARIL) 10 MG tablet Take 1 tablet (10 mg total) by mouth 3 (three) times daily as needed. 270 tablet 1  . Multiple Vitamins-Minerals (MULTIVITAMIN PO) Take by mouth.    . Vitamin D, Ergocalciferol, (DRISDOL) 50000 units CAPS capsule TAKE 1 CAPSULE (50,000 UNITS TOTAL) BY MOUTH EVERY 7 (SEVEN) DAYS. 12 capsule 0    Results for orders placed or performed during the hospital encounter of 10/30/16 (from the past 48 hour(s))  Basic metabolic panel     Status: Abnormal   Collection Time: 10/30/16  2:13 AM  Result Value Ref Range   Sodium 138 135 - 145 mmol/L   Potassium 3.6 3.5 - 5.1 mmol/L   Chloride 106 101 - 111 mmol/L   CO2 26 22 - 32 mmol/L   Glucose, Bld 107 (H) 65 - 99 mg/dL   BUN 13 6 - 20 mg/dL   Creatinine, Ser 0.91 0.61 - 1.24 mg/dL   Calcium 8.5 (L) 8.9 - 10.3 mg/dL   GFR calc non Af Amer >60 >60  mL/min   GFR calc Af Amer >60 >60 mL/min    Comment: (NOTE) The eGFR has been calculated using the CKD EPI equation. This calculation has not been validated in all clinical situations. eGFR's persistently <60 mL/min signify possible Chronic Kidney Disease.    Anion gap 6 5 - 15  Troponin I     Status: None   Collection Time: 10/30/16  2:13 AM  Result Value Ref Range   Troponin I <0.03 <0.03 ng/mL   No results found.  ROS All systems reviewed and negative except as noted in the HPI  Blood pressure 114/83, pulse (!) 54, temperature 98.1 F (36.7 C), temperature source Oral, resp. rate 16, height 5' 9"  (1.753 m), weight 203 lb 4.8 oz (92.2 kg), SpO2 98 %. Physical Exam  Well appearing middle aged man, NAD HEENT: Unremarkable,[place], AT Neck:  6 JVD, no thyromegally Back:  No CVA tenderness Lungs:  Clear with no wheezes, rales, or rhonchi HEART:  Regular  rate rhythm, no murmurs, no rubs, no clicks Abd:  soft, positive bowel sounds, no organomegally, no rebound, no guarding Ext:  2 plus pulses, no edema, no cyanosis, no clubbing Skin:  No rashes no nodules Neuro:  CN II through XII intact, motor grossly intact  EKG - sinus bradycardia with non-specific STT changes  Labs - reviewed. Negative for ischemia  Assessment/Plan 1. Chest pain - typical and atypical features. He has ruled out. I would suggest he undergo plain exercise stress testing. If low risk he can be discharged home. 2. Obesity - he will be encouraged to lose weight.  Cristopher Peru 10/30/2016, 6:28 AM

## 2016-10-31 ENCOUNTER — Encounter (HOSPITAL_COMMUNITY): Payer: Self-pay | Admitting: *Deleted

## 2016-10-31 ENCOUNTER — Ambulatory Visit: Payer: BLUE CROSS/BLUE SHIELD | Admitting: Neurology

## 2016-10-31 ENCOUNTER — Inpatient Hospital Stay (HOSPITAL_COMMUNITY): Payer: BLUE CROSS/BLUE SHIELD

## 2016-10-31 DIAGNOSIS — I480 Paroxysmal atrial fibrillation: Secondary | ICD-10-CM

## 2016-10-31 DIAGNOSIS — R0789 Other chest pain: Secondary | ICD-10-CM

## 2016-10-31 MED ORDER — METOPROLOL TARTRATE 25 MG PO TABS
25.0000 mg | ORAL_TABLET | Freq: Two times a day (BID) | ORAL | Status: DC
  Administered 2016-10-31 – 2016-11-01 (×2): 25 mg via ORAL
  Filled 2016-10-31 (×2): qty 1

## 2016-10-31 MED ORDER — IOPAMIDOL (ISOVUE-370) INJECTION 76%
INTRAVENOUS | Status: AC
Start: 2016-10-31 — End: 2016-10-31
  Administered 2016-10-31: 100 mL
  Filled 2016-10-31: qty 100

## 2016-10-31 NOTE — Care Management Note (Signed)
Case Management Note  Patient Details  Name: Daniel [name] MRN: [id number] Date of Birth: Nov 04, 1950  Subjective/Objective:                 Patient form home in [place], lives alone. CP, w Afib w RVR, on cardizem gtt. SOB w ambulation.  PCP Ronnald Ramp, THOMAS L     Action/Plan:  Anticipate DC to home in 0-1 days.  Expected Discharge Date:                  Expected Discharge Plan:  Home/Self Care  In-House Referral:     Discharge planning Services  CM Consult  Post Acute Care Choice:    Choice offered to:     DME Arranged:    DME Agency:     HH Arranged:    HH Agency:     Status of Service:  In process, will continue to follow  If discussed at Long Length of Stay Meetings, dates discussed:    Additional Comments:  Daniel Collet, RN 10/31/2016, 12:26 PM

## 2016-10-31 NOTE — Progress Notes (Signed)
Progress Note  Patient Name: Daniel [name] Date of Encounter: 10/31/2016  Primary Cardiologist:  [place] / Harrington Challenger    Subjective   Transfer from [facility] w/ CP.  No objective sx of ischemia and pain resolved with NTG. Trop neg x 3. Echo done yesterday EF 60-65%, mild LVH. Lexiscan done yesterday normal low risk study.  Had negative Nuc study yesterday, plan was for discharge but he began to have rapid atrial fibrillation. Diltiazem drip started  And if he does not convert plan for IV Flecainide. Pt has converted from a fib and currently in sinus rhythm with PVCs.   He has been encouraged not to get up much but has noticed palpitations and SOB when he does move.   Inpatient Medications    Scheduled Meds: . heparin  5,000 Units Subcutaneous Q8H   Continuous Infusions: . diltiazem (CARDIZEM) infusion 5 mg/hr (10/31/16 0024)   PRN Meds: acetaminophen, ondansetron (ZOFRAN) IV   Vital Signs    Vitals:   10/30/16 1302 10/30/16 1435 10/30/16 2054 10/31/16 0517  BP: (!) 150/82 121/76 113/75 125/88  Pulse: 91  (!) 106 92  Resp: 20 16 20  (!) 21  Temp: 98 F (36.7 C)  99 F (37.2 C) 98.9 F (37.2 C)  TempSrc: Oral  Oral Oral  SpO2: 96%  96% 97%  Weight:    200 lb 1.6 oz (90.8 kg)  Height:        Intake/Output Summary (Last 24 hours) at 10/31/16 1040 Last data filed at 10/31/16 0926  Gross per 24 hour  Intake           132.33 ml  Output                0 ml  Net           132.33 ml   Filed Weights   10/30/16 0124 10/31/16 0517  Weight: 203 lb 4.8 oz (92.2 kg) 200 lb 1.6 oz (90.8 kg)    Telemetry    HR 75, episodes of paroxysmal atrial fibrillation with RVR and intermittent PVCs - Personally Reviewed   Physical Exam   GEN: Well nourished, well developed HEENT: normal  Neck: no JVD, carotid bruits, or masses Cardiac: RRR. no murmurs, rubs, or gallops,no edema. Intact distal pulses bilaterally.  Respiratory: clear to auscultation bilaterally, normal  work of breathing GI: soft, nontender, nondistended, + BS MS: no deformity or atrophy  Skin: warm and dry, no rash Neuro: Alert and Oriented x 3, Strength and sensation are intact Psych:   Full affect  Labs    Chemistry Recent Labs Lab 10/30/16 0213 10/30/16 0729  NA 138  --   K 3.6  --   CL 106  --   CO2 26  --   GLUCOSE 107*  --   BUN 13  --   CREATININE 0.91 0.84  CALCIUM 8.5*  --   GFRNONAA >60 >60  GFRAA >60 >60  ANIONGAP 6  --      Hematology Recent Labs Lab 10/30/16 0729  WBC 5.4  RBC 4.40  HGB 13.4  HCT 40.3  MCV 91.6  MCH 30.5  MCHC 33.3  RDW 14.0  PLT 200    Cardiac Enzymes Recent Labs Lab 10/30/16 0213 10/30/16 0729 10/30/16 1350  TROPONINI <0.03 <0.03 <0.03   No results for input(s): TROPIPOC in the last 168 hours.     Radiology    Nm Myocar Multi W/spect W/wall Motion / Ef  Result Date:  10/30/2016  The study is normal.  This is a low risk study.  The left ventricular ejection fraction is normal (55-65%).  Normal resting and stress perfusion. No ischemia or infarction EF EF 61%    Cardiac Studies   Echocardiogram 10/30/2016  Study Conclusions  - Left ventricle: The cavity size was normal. Wall thickness was   increased in a pattern of mild LVH. Systolic function was normal.   The estimated ejection fraction was in the range of 60% to 65%.   Wall motion was normal; there were no regional wall motion   abnormalities. - Atrial septum: There was increased thickness of the septum,   consistent with lipomatous hypertrophy. No defect or patent   foramen ovale was identified.  Myoview Nuc 11/09/2016   The study is normal.  This is a low risk study.  The left ventricular ejection fraction is normal (55-65%).   Normal resting and stress perfusion. No ischemia or infarction EF EF 61%    Patient Profile     The patient is a pleasant 66 yo man with no significant cardiac history who presents in transfer from [facility] with chest pain. He had no objective evidence of ischemia and his pain resolved with ntg. He had associated sob. No syncope. He denies a family h/o CAD and does not smoke. The pain did not radiate to his arms and was not associated with nausea or vomiting. He was given IV lovenox and ASA. His cardiac enzymes are negative.  Assessment & Plan    1. Paroxysmal atrial fibrillation (new onset?): Patient had a normal Nuclear stress test and echocardiogram to evaluate for CP. Admits these were the symptoms that brought him to the ER in the first place. He was started on a slow IV dilt drip and is currently converted to sinus rhythm. Pt's CHADSVASc score is 0 therefore he was not started on anticoagulants. TSH is normal.  Echo (EF 60-65%), mild LVH, no regional wall motion abnormality. -- plan if he did not convert was  flecainide and poss TEE/cardioverson -- Otherwise patient doing well,  Dc IV dilt and change to PO   2. Chest Pain: Low risk nuclear study patient can be discharged from an ischemic work-up stand point.  3. Obesity: Weight loss discussed.  Patient either dc today or tomorrow, will definitely need to ambulate before discharge to see how he does.  Kristopher Glee, PA-C  10/31/2016, 10:40 AM     Attending Note:    The patient was seen and examined.  Agree with assessment and plan as noted above.  Changes made to the above note as needed.  Patient seen and independently examined with Delos Haring, PA .   We discussed all aspects of the encounter. I agree with the assessment and plan as stated above.  1. Chest pain: severe dyspnea with exertion .  The patient ruled out for myocardial infarction. He had a stress Myoview study that was low risk.  2. Paroxysmal atrial fibrillation: The patient was found have rapid atrial fibrillation today. He converted with IV diltiazem. Will change to PO metoprolol 25 PO BID   Will get a CT angiogram to rule out pulmonary  embolus    I have spent a total of 40 minutes with patient reviewing hospital  notes , telemetry, EKGs, labs and examining patient as well as establishing an assessment and plan that was discussed with the patient. > 50% of time was spent in direct patient care.  Thayer Headings, Brooke Bonito., MD, [facility] 10/31/2016, 1:05 PM [address]  [place] Pager [phone]

## 2016-11-01 MED ORDER — METOPROLOL TARTRATE 25 MG PO TABS: 25.0000 mg | ORAL_TABLET | Freq: Two times a day (BID) | ORAL | 3 refills | Status: DC

## 2016-11-01 MED FILL — Fentanyl Citrate Preservative Free (PF) Inj 100 MCG/2ML: INTRAMUSCULAR | Qty: 2 | Status: AC

## 2016-11-01 NOTE — Progress Notes (Signed)
Progress Note  Patient Name: Daniel [name] Date of Encounter: 11/01/2016  Primary Cardiologist: [place] / Harrington Challenger    Subjective   Transfer from [facility] w/ CP.  No objective sx of ischemia and pain resolved with NTG. Trop neg x 3. Echo done this admission EF 60-65%, mild LVH. Lexiscan done this admission normal low risk study. Developed atrial fibrillation w/ RVR prior to discharge. Dilt drip started and he converted to sinus rhythm. Changed to PO Metoprolol he ambulated and has done well, remains in sinus rhythm. Neg CT angio of the chest to rule out PE.  This morning he is feeling well, he has ambulated without any palpitations or shortness of breath. Feels ready for home.  Inpatient Medications    Scheduled Meds: . heparin  5,000 Units Subcutaneous Q8H  . metoprolol tartrate  25 mg Oral BID   Continuous Infusions:  PRN Meds: acetaminophen, ondansetron (ZOFRAN) IV   Vital Signs    Vitals:   10/31/16 0517 10/31/16 1450 10/31/16 2056 11/01/16 0457  BP: 125/88 125/83 (!) 157/78 118/82  Pulse: 92 60 66 (!) 59  Resp: (!) 21  17 (!) 22  Temp: 98.9 F (37.2 C) 98.4 F (36.9 C) 98.1 F (36.7 C) 98.4 F (36.9 C)  TempSrc: Oral Oral Oral Oral  SpO2: 97% 97% 96% 94%  Weight: 200 lb 1.6 oz (90.8 kg)   200 lb (90.7 kg)  Height:        Intake/Output Summary (Last 24 hours) at 11/01/16 0910 Last data filed at 11/01/16 0829  Gross per 24 hour  Intake              480 ml  Output                1 ml  Net              479 ml   Filed Weights   10/30/16 0124 10/31/16 0517 11/01/16 0457  Weight: 203 lb 4.8 oz (92.2 kg) 200 lb 1.6 oz (90.8 kg) 200 lb (90.7 kg)    Telemetry    NSR, Hr 70s - Personally Reviewed   Physical Exam   GEN: Well nourished, well developed HEENT: normal  Neck: no JVD, carotid bruits, or masses Cardiac: RRR. no murmurs, rubs, or gallops,no edema. Intact distal pulses bilaterally.  Respiratory: clear to auscultation bilaterally, normal  work of breathing GI: soft, nontender, nondistended, + BS MS: no deformity or atrophy  Skin: warm and dry, no rash Neuro: Alert and Oriented x 3, Strength and sensation are intact Psych:   Full affect Labs    Chemistry Recent Labs Lab 10/30/16 0213 10/30/16 0729  NA 138  --   K 3.6  --   CL 106  --   CO2 26  --   GLUCOSE 107*  --   BUN 13  --   CREATININE 0.91 0.84  CALCIUM 8.5*  --   GFRNONAA >60 >60  GFRAA >60 >60  ANIONGAP 6  --      Hematology Recent Labs Lab 10/30/16 0729  WBC 5.4  RBC 4.40  HGB 13.4  HCT 40.3  MCV 91.6  MCH 30.5  MCHC 33.3  RDW 14.0  PLT 200    Cardiac Enzymes Recent Labs Lab 10/30/16 0213 10/30/16 0729 10/30/16 1350  TROPONINI <0.03 <0.03 <0.03   No results for input(s): TROPIPOC in the last 168 hours.   BNPNo results for input(s): BNP, PROBNP in the last 168 hours.  DDimer No results for input(s): DDIMER in the last 168 hours.   Radiology    Ct Angio Chest Pe W Or Wo Contrast  Result Date: 10/31/2016 CLINICAL DATA:  Mid chest pain and shortness of breath beginning 2 days ago now resolved. EXAM: CT ANGIOGRAPHY CHEST WITH CONTRAST TECHNIQUE: Multidetector CT imaging of the chest was performed using the standard protocol during bolus administration of intravenous contrast. Multiplanar CT image reconstructions and MIPs were obtained to evaluate the vascular anatomy. CONTRAST:  100 mL Isovue 370 IV. COMPARISON:  Abdominopelvic CT 07/28/2015 FINDINGS: Cardiovascular: Mild prominence of the heart. Minimal calcified plaque over the thoracic aorta. No evidence of pulmonary embolism. Mediastinum/Nodes: No evidence of mediastinal or hilar adenopathy. Remaining mediastinal structures are within normal. Lungs/Pleura: Lungs are adequately inflated without airspace consolidation or effusion. Airways are within normal. Upper Abdomen: Within normal. Musculoskeletal: Minimal degenerate change of the spine. Prominent Schmorl's node over 2 adjacent  lower thoracic vertebral bodies. Hemangioma over a mid thoracic vertebral body. Review of the MIP images confirms the above findings. IMPRESSION: No acute cardiopulmonary disease. No evidence of pulmonary embolism. Electronically Signed   By: Marin Olp M.D.   On: 10/31/2016 16:57   Nm Myocar Multi W/spect W/wall Motion / Ef  Result Date: 10/30/2016  The study is normal.  This is a low risk study.  The left ventricular ejection fraction is normal (55-65%).  Normal resting and stress perfusion. No ischemia or infarction EF EF 61%    Cardiac Studies   Echocardiogram 10/30/2016  Study Conclusions  - Left ventricle: The cavity size was normal. Wall thickness was increased in a pattern of mild LVH. Systolic function was normal. The estimated ejection fraction was in the range of 60% to 65%. Wall motion was normal; there were no regional wall motion abnormalities. - Atrial septum: There was increased thickness of the septum, consistent with lipomatous hypertrophy. No defect or patent foramen ovale was identified.  Myoview Nuc 11/09/2016   The study is normal.  This is a low risk study.  The left ventricular ejection fraction is normal (55-65%).  Normal resting and stress perfusion. No ischemia or infarction EF EF 61%   Patient Profile     The patient is a pleasant 66 yo man with no significant cardiac history who presents in transfer from [facility] with chest pain. He had no objective evidence of ischemia and his pain resolved with ntg. He had associated sob. No syncope. He denies a family h/o CAD and does not smoke. The pain did not radiate to his arms and was not associated with nausea or vomiting. He was given IV lovenox and ASA. His cardiac enzymes are negative.   Assessment & Plan     1. Chest pain with dyspnea:  He has ruled out for MI. Had a low risk Myoview stress test. Negative CT angio of the chest  2. Paroxysmal atrial  fibrillation: The patient was found to have rapid atrial fibrillation during this admission, he was started on .  IV diltiazem converted to sinus rhythm, this was discontinued and changed to BB. He has done well on PO Metoprolol 25 mg PO BID.   Likely a discharge today. Will need follow-up appointment which I will schedule.  Kristopher Glee, PA-C  11/01/2016, 9:10 AM     Attending Note:   The patient was seen and examined.  Agree with assessment and plan as noted above.  Changes made to the above note as needed.  Patient seen  and independently examined with Delos Haring, PA .   We discussed all aspects of the encounter. I agree with the assessment and plan as stated above.  1.   Chest pain :   Had a low risk myoview. No further episodes of CP Ready for DC  2.  Paroxysmal atrial fib :  CHADS2VASC is 0  Is not on [place].\ CT angio of the chest was negative for PE   Ready for DC      I have spent a total of 40 minutes with patient reviewing hospital  notes , telemetry, EKGs, labs and examining patient as well as establishing an assessment and plan that was discussed with the patient. > 50% of time was spent in direct patient care.     Thayer Headings, Brooke Bonito., MD, [facility] 11/01/2016, 9:36 AM [address]  [place] Pager [phone]

## 2016-11-01 NOTE — Discharge Instructions (Signed)
Atrial Fibrillation Atrial fibrillation is a type of irregular or rapid heartbeat (arrhythmia). In atrial fibrillation, the heart quivers continuously in a chaotic pattern. This occurs when parts of the heart receive disorganized signals that make the heart unable to pump blood normally. This can increase the risk for stroke, heart failure, and other heart-related conditions. There are different types of atrial fibrillation, including:  Paroxysmal atrial fibrillation. This type starts suddenly, and it usually stops on its own shortly after it starts.  Persistent atrial fibrillation. This type often lasts longer than a week. It may stop on its own or with treatment.  Long-lasting persistent atrial fibrillation. This type lasts longer than 12 months.  Permanent atrial fibrillation. This type does not go away.  Talk with your health care provider to learn about the type of atrial fibrillation that you have. What are the causes? This condition is caused by some heart-related conditions or procedures, including:  A heart attack.  Coronary artery disease.  Heart failure.  Heart valve conditions.  High blood pressure.  Inflammation of the sac that surrounds the heart (pericarditis).  Heart surgery.  Certain heart rhythm disorders, such as Wolf-Parkinson-White syndrome.  Other causes include:  Pneumonia.  Obstructive sleep apnea.  Blockage of an artery in the lungs (pulmonary embolism, or PE).  Lung cancer.  Chronic lung disease.  Thyroid problems, especially if the thyroid is overactive (hyperthyroidism).  Caffeine.  Excessive alcohol use or illegal drug use.  Use of some medicines, including certain decongestants and diet pills.  Sometimes, the cause cannot be found. What increases the risk? This condition is more likely to develop in:  People who are older in age.  People who smoke.  People who have diabetes mellitus.  People who are overweight  (obese).  Athletes who exercise vigorously.  What are the signs or symptoms? Symptoms of this condition include:  A feeling that your heart is beating rapidly or irregularly.  A feeling of discomfort or pain in your chest.  Shortness of breath.  Sudden light-headedness or weakness.  Getting tired easily during exercise.  In some cases, there are no symptoms. How is this diagnosed? Your health care provider may be able to detect atrial fibrillation when taking your pulse. If detected, this condition may be diagnosed with:  An electrocardiogram (ECG).  A Holter monitor test that records your heartbeat patterns over a 24-hour period.  Transthoracic echocardiogram (TTE) to evaluate how blood flows through your heart.  Transesophageal echocardiogram (TEE) to view more detailed images of your heart.  A stress test.  Imaging tests, such as a CT scan or chest X-ray.  Blood tests.  How is this treated? The main goals of treatment are to prevent blood clots from forming and to keep your heart beating at a normal rate and rhythm. The type of treatment that you receive depends on many factors, such as your underlying medical conditions and how you feel when you are experiencing atrial fibrillation. This condition may be treated with:  Medicine to slow down the heart rate, bring the heart's rhythm back to normal, or prevent clots from forming.  Electrical cardioversion. This is a procedure that resets your heart's rhythm by delivering a controlled, low-energy shock to the heart through your skin.  Different types of ablation, such as catheter ablation, catheter ablation with pacemaker, or surgical ablation. These procedures destroy the heart tissues that send abnormal signals. When the pacemaker is used, it is placed under your skin to help your heart beat in   a regular rhythm.  Follow these instructions at home:  Take over-the counter and prescription medicines only as told by your  health care provider.  If your health care provider prescribed a blood-thinning medicine (anticoagulant), take it exactly as told. Taking too much blood-thinning medicine can cause bleeding. If you do not take enough blood-thinning medicine, you will not have the protection that you need against stroke and other problems.  Do not use tobacco products, including cigarettes, chewing tobacco, and e-cigarettes. If you need help quitting, ask your health care provider.  If you have obstructive sleep apnea, manage your condition as told by your health care provider.  Do not drink alcohol.  Do not drink beverages that contain caffeine, such as coffee, soda, and tea.  Maintain a healthy weight. Do not use diet pills unless your health care provider approves. Diet pills may make heart problems worse.  Follow diet instructions as told by your health care provider.  Exercise regularly as told by your health care provider.  Keep all follow-up visits as told by your health care provider. This is important. How is this prevented?  Avoid drinking beverages that contain caffeine or alcohol.  Avoid certain medicines, especially medicines that are used for breathing problems.  Avoid certain herbs and herbal medicines, such as those that contain ephedra or ginseng.  Do not use illegal drugs, such as cocaine and amphetamines.  Do not smoke.  Manage your high blood pressure. Contact a health care provider if:  You notice a change in the rate, rhythm, or strength of your heartbeat.  You are taking an anticoagulant and you notice increased bruising.  You tire more easily when you exercise or exert yourself. Get help right away if:  You have chest pain, abdominal pain, sweating, or weakness.  You feel nauseous.  You notice blood in your vomit, bowel movement, or urine.  You have shortness of breath.  You suddenly have swollen feet and ankles.  You feel dizzy.  You have sudden weakness or  numbness of the face, arm, or leg, especially on one side of the body.  You have trouble speaking, trouble understanding, or both (aphasia).  Your face or your eyelid droops on one side. These symptoms may represent a serious problem that is an emergency. Do not wait to see if the symptoms will go away. Get medical help right away. Call your local emergency services (911 in the [place] Do not drive yourself to the hospital. This information is not intended to replace advice given to you by your health care provider. Make sure you discuss any questions you have with your health care provider. Document Released: 05/09/2005 Document Revised: 09/16/2015 Document Reviewed: 09/03/2014 Elsevier Interactive Patient Education  2017 [employer]  

## 2016-11-11 ENCOUNTER — Encounter: Payer: Self-pay | Admitting: Internal Medicine

## 2016-11-11 ENCOUNTER — Ambulatory Visit (INDEPENDENT_AMBULATORY_CARE_PROVIDER_SITE_OTHER): Payer: BLUE CROSS/BLUE SHIELD | Admitting: Internal Medicine

## 2016-11-11 ENCOUNTER — Encounter (INDEPENDENT_AMBULATORY_CARE_PROVIDER_SITE_OTHER): Payer: Self-pay

## 2016-11-11 VITALS — BP 112/80 | HR 60 | Ht 69.0 in | Wt 206.0 lb

## 2016-11-11 DIAGNOSIS — R079 Chest pain, unspecified: Secondary | ICD-10-CM | POA: Diagnosis not present

## 2016-11-11 DIAGNOSIS — I480 Paroxysmal atrial fibrillation: Secondary | ICD-10-CM

## 2016-11-11 NOTE — Progress Notes (Signed)
HPI Mr. Daniel [name] returns today for followup. He is a pleasant 66 yo man who was transferred to [facility] for evaluation of chest pain 2 weeks ago. He was evaluated and had a negative stress test and CT scan. He was discharged home and has done well. He has rare palpitations and non-exertional chest pain. No syncope and no edema. His echo EF was normal.  Allergies  Allergen Reactions  . Durezol [Difluprednate]     Eye drops-swelling     Current Outpatient Prescriptions  Medication Sig Dispense Refill  . acetaminophen (TYLENOL) 500 MG tablet Take 1,000 mg by mouth every 6 (six) hours as needed for mild pain.    Marland Kitchen allopurinol (ZYLOPRIM) 100 MG tablet Take 2 tablets (200 mg total) by mouth daily. 60 tablet 6  . amitriptyline (ELAVIL) 10 MG tablet Take 1 tablet (10 mg total) by mouth at bedtime. 90 tablet 3  . Diclofenac Sodium (PENNSAID) 2 % SOLN Place 2 application onto the skin 2 (two) times daily. (Patient taking differently: Place 1 application onto the skin 2 (two) times daily. ) 112 g 3  . hydrOXYzine (ATARAX/VISTARIL) 10 MG tablet Take 1 tablet (10 mg total) by mouth 3 (three) times daily as needed. (Patient taking differently: Take 10 mg by mouth daily. ) 270 tablet 1  . metoprolol tartrate (LOPRESSOR) 25 MG tablet Take 1 tablet (25 mg total) by mouth 2 (two) times daily. 60 tablet 3  . Multiple Vitamins-Minerals (MULTIVITAMIN PO) Take 1 tablet by mouth at bedtime.     Marland Kitchen tiZANidine (ZANAFLEX) 2 MG tablet Take 2 mg by mouth 3 (three) times daily as needed for muscle spasms.    . Vitamin D, Ergocalciferol, (DRISDOL) 50000 units CAPS capsule TAKE 1 CAPSULE (50,000 UNITS TOTAL) BY MOUTH EVERY 7 (SEVEN) DAYS. (Patient taking differently: Take 50,000 Units by mouth every 7 (seven) days. Every sunday) 12 capsule 0   No current facility-administered medications for this visit.      Past Medical History:  Diagnosis Date  . Acne   . Anxiety   . DDD (degenerative disc disease), lumbosacral     chronic back pain  . Diverticulosis of colon (without mention of hemorrhage)    colo 09/2010  . Low back pain radiating to both legs     ROS:   All systems reviewed and negative except as noted in the HPI.   Past Surgical History:  Procedure Laterality Date  . CATARACT EXTRACTION Left 07/14/2015  . CYST EXCISION Left 06/23/2015   Procedure: EXCISION MASS LEFT INDEX FINGER;  Surgeon: Daryll Brod, MD;  Location: [place];  Service: Orthopedics;  Laterality: Left;  ANESTHESIA: IV REGIONAL/FAB  . HERNIA REPAIR    . REFRACTIVE SURGERY     left eye/ with enhancement  . TONSILLECTOMY       Family History  Problem Relation Age of Onset  . Diabetes Mother   . Hypertension Mother   . Arthritis Mother   . Arthritis Father   . Heart disease Father   . Diabetes Sister   . Cancer Neg Hx   . Stroke Neg Hx   . Kidney disease Neg Hx   . Early death Neg Hx      Social History   Social History  . Marital status: Single    Spouse name: N/A  . Number of children: N/A  . Years of education: N/A   Occupational History  . Not on file.   Social History Main  Topics  . Smoking status: Never Smoker  . Smokeless tobacco: Never Used  . Alcohol use Yes     Comment: weekly  . Drug use: No  . Sexual activity: Not on file   Other Topics Concern  . Not on file   Social History Narrative   Caffienated beverages-Yes   Seat belts often-Yes   Smoke alarm in home-Yes   Firearms/guns in home-Yes   History of physical abuse-NO   HLE- Masters degree     BP 112/80   Pulse 60   Ht 5\' 9"  (1.753 m)   Wt 206 lb (93.4 kg)   SpO2 98%   BMI 30.42 kg/m   Physical Exam:  Well appearing 78 y o man, NAD HEENT: Unremarkable Neck:  6 cm JVD, no thyromegally Lymphatics:  No adenopathy Back:  No CVA tenderness Lungs:  Clear with no wheezes HEART:  Regular rate rhythm, no murmurs, no rubs, no clicks Abd:  soft, positive bowel sounds, no organomegally, no rebound, no  guarding Ext:  2 plus pulses, no edema, no cyanosis, no clubbing Skin:  No rashes no nodules Neuro:  CN II through XII intact, motor grossly intact  EKG - Sinus bradycardia with rare PVC's.  2d echo - reviewed myoview imaging - reviewed CT scan - reviewed  Assess/Plan: 1. Chest pain - no evidence of CAD.  I suspect he has esophageal spasm/reflux and have recommended he try over the counter omeprazole. 2. Obesity - I have encouraged the patient to walk or perform regular daily exercise 3. HTN - his blood pressure is controlled. He was taking low dose beta blocker and I have asked that he hold this and follow his blood pressures and try to lose weight and eat less salt. 4. Palpitations - he has occaisional PVC's. I discussed the benign nature of these. He may restart the beta blocker if his palpitations worsen.  Mikle Bosworth.D.

## 2016-11-11 NOTE — Patient Instructions (Addendum)
Medication Instructions:  Your physician recommends that you continue on your current medications as directed. Please refer to the Current Medication list given to you today.   Labwork: None Ordered   Testing/Procedures: None Ordered   Follow-Up: Follow-up with Dr. Lovena Le as needed   Any Other Special Instructions Will Be Listed Below (If Applicable). Call if you need a refill on Metoprolol    If you need a refill on your cardiac medications before your next appointment, please call your pharmacy.

## 2016-11-14 ENCOUNTER — Encounter: Payer: Self-pay | Admitting: Neurology

## 2016-11-14 ENCOUNTER — Encounter: Payer: Self-pay | Admitting: Family Medicine

## 2016-11-14 ENCOUNTER — Ambulatory Visit (INDEPENDENT_AMBULATORY_CARE_PROVIDER_SITE_OTHER): Payer: BLUE CROSS/BLUE SHIELD | Admitting: Family Medicine

## 2016-11-14 ENCOUNTER — Ambulatory Visit (INDEPENDENT_AMBULATORY_CARE_PROVIDER_SITE_OTHER): Payer: BLUE CROSS/BLUE SHIELD | Admitting: Neurology

## 2016-11-14 VITALS — BP 128/70 | HR 62 | Ht 69.0 in | Wt 207.4 lb

## 2016-11-14 VITALS — BP 124/80 | HR 67 | Ht 69.0 in

## 2016-11-14 DIAGNOSIS — M999 Biomechanical lesion, unspecified: Secondary | ICD-10-CM

## 2016-11-14 DIAGNOSIS — R51 Headache: Secondary | ICD-10-CM

## 2016-11-14 DIAGNOSIS — M542 Cervicalgia: Secondary | ICD-10-CM

## 2016-11-14 DIAGNOSIS — G8929 Other chronic pain: Secondary | ICD-10-CM | POA: Diagnosis not present

## 2016-11-14 DIAGNOSIS — G4486 Cervicogenic headache: Secondary | ICD-10-CM

## 2016-11-14 NOTE — Patient Instructions (Signed)
1.  Continue amitriptyline 10mg  at bedtime 2.  Use tizanidine for neck spasm 3.  Follow up treatment with Dr. Tamala Julian 4.  Follow up in one year

## 2016-11-14 NOTE — Assessment & Plan Note (Signed)
  Arthritic changes. Doing well overall. We discussed icing regimen. No change in management. Discussed ergonomics and the importance. Follow-up again in 4-6 weeks

## 2016-11-14 NOTE — Progress Notes (Signed)
NEUROLOGY FOLLOW UP OFFICE NOTE  Daniel [name] [id number]  HISTORY OF PRESENT ILLNESS: Daniel [name] is a 66 year old right-handed man with no significant past medical history who follows up for cervicogenic headache.   UPDATE: MRI of cervical spine from 12/20/15 was personally reviewed and revealed mild to moderate cervical degenerative disc disease but no stenosis or nerve root compression.  He takes amitriptyline 10mg  daily.  He was prescribed tizanidine 2mg  for neck tension.  He is also treated by Dr. Tamala Julian with OMT.   He is doing well.  Headache frequency varies depending on stress, but overall they are infrequent.  They occur about 3 to 4 days per month.  They are treated with Tylenol.    HISTORY:  In July 2017, he developed right sided pain at the base of his skull with neck stiffness.  He denied any preceding event that triggered it.  It is in the right suboccipital region and radiates up the back of his head on the right.  It feels swollen.  He also has right paraspinal tenderness radiating into the trapezius.  Neck movement in all directions exacerbates it.  He denies radicular pain or weakness in the arm, but he reports occasional numbness and tingling in the right hand and forearm, noticeable when sitting in his chair at night.  PAST MEDICAL HISTORY: Past Medical History:  Diagnosis Date  . Acne   . Anxiety   . DDD (degenerative disc disease), lumbosacral    chronic back pain  . Diverticulosis of colon (without mention of hemorrhage)    colo 09/2010  . Low back pain radiating to both legs     MEDICATIONS: Current Outpatient Prescriptions on File Prior to Visit  Medication Sig Dispense Refill  . acetaminophen (TYLENOL) 500 MG tablet Take 1,000 mg by mouth every 6 (six) hours as needed for mild pain.    Marland Kitchen allopurinol (ZYLOPRIM) 100 MG tablet Take 2 tablets (200 mg total) by mouth daily. 60 tablet 6  . amitriptyline (ELAVIL) 10 MG tablet Take 1 tablet (10 mg total) by mouth  at bedtime. 90 tablet 3  . Diclofenac Sodium (PENNSAID) 2 % SOLN Place 2 application onto the skin 2 (two) times daily. (Patient taking differently: Place 1 application onto the skin 2 (two) times daily. ) 112 g 3  . hydrOXYzine (ATARAX/VISTARIL) 10 MG tablet Take 1 tablet (10 mg total) by mouth 3 (three) times daily as needed. (Patient taking differently: Take 10 mg by mouth daily. ) 270 tablet 1  . Multiple Vitamins-Minerals (MULTIVITAMIN PO) Take 1 tablet by mouth at bedtime.     Marland Kitchen tiZANidine (ZANAFLEX) 2 MG tablet Take 2 mg by mouth 3 (three) times daily as needed for muscle spasms.    . Vitamin D, Ergocalciferol, (DRISDOL) 50000 units CAPS capsule TAKE 1 CAPSULE (50,000 UNITS TOTAL) BY MOUTH EVERY 7 (SEVEN) DAYS. (Patient taking differently: Take 50,000 Units by mouth every 7 (seven) days. Every sunday) 12 capsule 0   No current facility-administered medications on file prior to visit.     ALLERGIES: Allergies  Allergen Reactions  . Durezol [Difluprednate]     Eye drops-swelling    FAMILY HISTORY: Family History  Problem Relation Age of Onset  . Diabetes Mother   . Hypertension Mother   . Arthritis Mother   . Arthritis Father   . Heart disease Father   . Diabetes Sister   . Cancer Neg Hx   . Stroke Neg Hx   . Kidney disease Neg  Hx   . Early death Neg Hx     SOCIAL HISTORY: Social History   Social History  . Marital status: Single    Spouse name: N/A  . Number of children: N/A  . Years of education: N/A   Occupational History  . Not on file.   Social History Main Topics  . Smoking status: Never Smoker  . Smokeless tobacco: Never Used  . Alcohol use Yes     Comment: weekly  . Drug use: No  . Sexual activity: Not on file   Other Topics Concern  . Not on file   Social History Narrative   Caffienated beverages-Yes   Seat belts often-Yes   Smoke alarm in home-Yes   Firearms/guns in home-Yes   History of physical abuse-NO   HLE- Masters degree    REVIEW  OF SYSTEMS: Constitutional: No fevers, chills, or sweats, no generalized fatigue, change in appetite Eyes: No visual changes, double vision, eye pain Ear, nose and throat: No hearing loss, ear pain, nasal congestion, sore throat Cardiovascular: No chest pain, palpitations Respiratory:  No shortness of breath at rest or with exertion, wheezes GastrointestinaI: No nausea, vomiting, diarrhea, abdominal pain, fecal incontinence Genitourinary:  No dysuria, urinary retention or frequency Musculoskeletal:  Neck pain Integumentary: No rash, pruritus, skin lesions Neurological: as above Psychiatric: No depression, insomnia, anxiety Endocrine: No palpitations, fatigue, diaphoresis, mood swings, change in appetite, change in weight, increased thirst Hematologic/Lymphatic:  No purpura, petechiae. Allergic/Immunologic: no itchy/runny eyes, nasal congestion, recent allergic reactions, rashes  PHYSICAL EXAM: Vitals:   11/14/16 1059  BP: 128/70  Pulse: 62   General: No acute distress.  Patient appears well-groomed.   Head:  Normocephalic/atraumatic, right suboccipital tenderness Eyes:  Fundi examined but not visualized Neck: supple, no paraspinal tenderness, full range of motion Heart:  Regular rate and rhythm Lungs:  Clear to auscultation bilaterally Back: No paraspinal tenderness Neurological Exam: alert and oriented to person, place, and time. Attention span and concentration intact, recent and remote memory intact, fund of knowledge intact.  Speech fluent and not dysarthric, language intact.  CN II-XII intact. Bulk and tone normal, muscle strength 5/5 throughout.  Sensation to light touch, temperature and vibration intact.  Deep tendon reflexes 2+ throughout, toes downgoing.  Finger to nose and heel to shin testing intact.  Gait normal, Romberg negative.  IMPRESSION: Cervicogenic headache  PLAN: 1.  Amitriptyline 10mg  at bedtime (helps him sleep) 2.  Tizanidine for neck tension 3.  Follow up  with Dr. Tamala Julian for treatments 4.  Follow up with me in one year.  15 minutes spent face to face with patient, over 50% spent discussing management.  Metta Clines, DO  CC:  Scarlette Calico, MD

## 2016-11-14 NOTE — Progress Notes (Signed)
[name] [address], [place] [address] Phone: [phone] Subjective:     CC:  Neck and headache follow-up left-sided knee pain f/u  [id number]  Daniel [name] is a 66 y.o. male coming in with complaint of   Patient is also having neck pain. Patient is seen neurology. Patient did have an MRI of the cervical spine 12/20/2015 showed moderate cervical disc degenerative changes. Otherwise unremarkable. No true nerve impingement.  Update 07/05/16- Patient states that his neck seems to be a little tight. 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 Patient tries to be active but continues to have some difficulty. Patient denies any numbness though. Patient denies any radiation down the arm. Has responded well to osteopathic manipulation. Just worsening of previous symptoms and no new symptoms.  April 30th 2018 update-patient states that he is having some increasing right upper back and neck pain. Has had significant amount of stressors in his leg. Patient's mother is going to an assisted living facility. 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 Also had a do a lot of manual labor recently.  Update 10/18/2016-patient continued to have tightness of the neck in the upper back. Patient denies any significant radiation. Has been doing the exercises intermittently. States that he feels like he is doing relatively well.  Patient has had knee problem previously. Has had significant amount of inflammation but states right leg pain more on the lateral aspect of the knee leg. States that it's only at night. Waking him up. Rates the severity of pain a 7 out of 10. Stiffness in the morning but gets better with activity. Minimal radiation down the leg. Not always associated with the back pain.  11/14/2016  update-patient unfortunately was in the hospital with a new onset of atrial fibrillation. Patient did have workup showing no coronary artery disease. Patient was having more of an esophageal spasm reflux and was to start Prilosec. Patient states doing relatively well. Patient unfortunately has more upper back and neck pain. Feels that it was when he was sitting for long amount of time. Was not doing the exercises.    Past Medical History:  Diagnosis Date  . Acne   . Anxiety   . DDD (degenerative disc disease), lumbosacral    chronic back pain  . Diverticulosis of colon (without mention of hemorrhage)    colo 09/2010  . Low back pain radiating to both legs    Past Surgical History:  Procedure Laterality Date  . CATARACT EXTRACTION Left 07/14/2015  . CYST EXCISION Left 06/23/2015   Procedure: EXCISION MASS LEFT INDEX FINGER;  Surgeon: Daryll Brod, MD;  Location: [facility];  Service: Orthopedics;  Laterality: Left;  ANESTHESIA: IV REGIONAL/FAB  . HERNIA REPAIR    . REFRACTIVE SURGERY     left eye/ with enhancement  . TONSILLECTOMY     Social History  Substance Use Topics  . Smoking status: Never Smoker  . Smokeless tobacco: Never Used  . Alcohol use Yes     Comment: weekly   Allergies  Allergen Reactions  . Durezol [Difluprednate]     Eye drops-swelling   Family History  Problem Relation Age of Onset  . Diabetes Mother   . Hypertension Mother   . Arthritis Mother   . Arthritis Father   .  Heart disease Father   . Diabetes Sister   . Cancer Neg Hx   . Stroke Neg Hx   . Kidney disease Neg Hx   . Early death Neg Hx     Past medical history, social, surgical and family history all reviewed in electronic medical record.   Review of Systems: No headache, visual changes, nausea, vomiting, diarrhea, constipation, dizziness, abdominal pain, skin rash, fevers, chills, night sweats, weight loss, swollen lymph nodes, body aches, joint swelling, chest pain, shortness of  breath, mood changes.  Positive muscle aches.     Objective  Blood pressure 124/80, pulse 67, height 5\' 9"  (1.753 m), SpO2 97 %.  Systems examined below as of 11/14/16 General: NAD A&O x3 mood, affect normal  HEENT: Pupils equal, extraocular movements intact no nystagmus Respiratory: not short of breath at rest or with speaking Cardiovascular: No lower extremity edema, non tender Skin: Warm dry intact with no signs of infection or rash on extremities or on axial skeleton. Abdomen: Soft nontender, no masses Neuro: Cranial nerves  intact, neurovascularly intact in all extremities with 2+ DTRs and 2+ pulses. Lymph: No lymphadenopathy appreciated today  Gait normal with good balance and coordination.  MSK: Non tender with full range of motion and good stability and symmetric strength and tone of shoulders, elbows, wrist,  knee hips and ankles bilaterally.    Neck: Inspection unremarkable. No palpable stepoffs. Negative Spurling's maneuver. Limited in all planes passively signed degrees Grip strength and sensation normal in bilateral hands Strength good C4 to T1 distribution No sensory change to C4 to T1 Negative Hoffman sign bilaterally Reflexes normal   Osteopathic findings C2 flexed rotated and side bent right C5 flexed rotated and side bent left C6 flexed rotated and side bent left T3 extended rotated and side bent right inhaled third rib T6 extended rotated and side bent left L2 flexed rotated and side bent right Sacrum right on right        Impression and Recommendations:     This case required medical decision making of moderate complexity.

## 2016-11-14 NOTE — Assessment & Plan Note (Signed)
Decision today to treat with OMT was based on Physical Exam  After verbal consent patient was treated with HVLA, ME, FPR techniques in cervical, thoracic, rib, lumbar and sacral areas  Patient tolerated the procedure well with improvement in symptoms  Patient given exercises, stretches and lifestyle modifications  See medications in patient instructions if given  Patient will follow up in 4-6 weeks 

## 2016-11-14 NOTE — Patient Instructions (Signed)
4-6 weeks

## 2016-12-12 ENCOUNTER — Encounter: Payer: Self-pay | Admitting: Family Medicine

## 2016-12-12 ENCOUNTER — Ambulatory Visit (INDEPENDENT_AMBULATORY_CARE_PROVIDER_SITE_OTHER): Payer: BLUE CROSS/BLUE SHIELD | Admitting: Family Medicine

## 2016-12-12 VITALS — BP 132/80 | HR 56 | Ht 69.0 in | Wt 209.0 lb

## 2016-12-12 DIAGNOSIS — M5030 Other cervical disc degeneration, unspecified cervical region: Secondary | ICD-10-CM

## 2016-12-12 DIAGNOSIS — M999 Biomechanical lesion, unspecified: Secondary | ICD-10-CM | POA: Diagnosis not present

## 2016-12-12 NOTE — Assessment & Plan Note (Signed)
Decision today to treat with OMT was based on Physical Exam  After verbal consent patient was treated with HVLA, ME, FPR techniques in cervical, thoracic, rib, lumbar and sacral areas  Patient tolerated the procedure well with improvement in symptoms  Patient given exercises, stretches and lifestyle modifications  See medications in patient instructions if given  Patient will follow up in 4 weeks 

## 2016-12-12 NOTE — Patient Instructions (Addendum)
Good to see you  You know the drill  Maybe take a couple weeks before you rearrange the house.  See me again in 6-7 weeks

## 2016-12-12 NOTE — Assessment & Plan Note (Signed)
Stable overall. No significant changes in management at this time. We discussed icing regimen and home exercises. Patient will increase activity as tolerated. Continue to work on Engineer, building services. Discussed lifting mechanics. Follow-up again in 4-6 weeks.

## 2016-12-12 NOTE — Progress Notes (Signed)
[name] [address] [place], [place] [address] Phone: [phone] Subjective:     CC:  Neck and headache follow-up left-sided knee pain f/u  [id number]  Daniel [name] is a 66 y.o. male coming in with complaint of   Patient is also having neck pain. Patient is seen neurology. Patient did have an MRI of the cervical spine 12/20/2015 showed moderate cervical disc degenerative changes. Otherwise unremarkable. No true nerve impingement.  Update 07/05/16- Patient states that his neck seems to be a little tight. 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 Patient tries to be active but continues to have some difficulty. Patient denies any numbness though. Patient denies any radiation down the arm. Has responded well to osteopathic manipulation. Just worsening of previous symptoms and no new symptoms.  April 30th 2018 update-patient states that he is having some increasing right upper back and neck pain. Has had significant amount of stressors in his leg. Patient's mother is going to an assisted living facility. 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 Also had a do a lot of manual labor recently.  Update 10/18/2016-patient continued to have tightness of the neck in the upper back. Patient denies any significant radiation. Has been doing the exercises intermittently. States that he feels like he is doing relatively well.  Patient has had knee problem previously. Has had significant amount of inflammation but states right leg pain more on the lateral aspect of the knee leg. States that it's only at night. Waking him up. Rates the severity of pain a 7 out of 10. Stiffness in the morning but gets better with activity. Minimal radiation down the leg. Not always associated with the back pain.  11/14/2016  update-patient unfortunately was in the hospital with a new onset of atrial fibrillation. Patient did have workup showing no coronary artery disease. Patient was having more of an esophageal spasm reflux and was to start Prilosec. Patient states doing relatively well. Patient unfortunately has more upper back and neck pain. Feels that it was when he was sitting for long amount of time. Was not doing the exercises.  12/12/2016- patient has been doing relatively well. Some mild increase and neck pain. Nothing severe. Patient has been doing relatively well. Has been moving a lot more at work. Very happy with the results that he is receiving over the course of time. States very active.    Past Medical History:  Diagnosis Date  . Acne   . Anxiety   . DDD (degenerative disc disease), lumbosacral    chronic back pain  . Diverticulosis of colon (without mention of hemorrhage)    colo 09/2010  . Low back pain radiating to both legs    Past Surgical History:  Procedure Laterality Date  . CATARACT EXTRACTION Left 07/14/2015  . CYST EXCISION Left 06/23/2015   Procedure: EXCISION MASS LEFT INDEX FINGER;  Surgeon: Daryll Brod, MD;  Location: [facility];  Service: Orthopedics;  Laterality: Left;  ANESTHESIA: IV REGIONAL/FAB  . HERNIA REPAIR    . REFRACTIVE SURGERY     left eye/ with enhancement  . TONSILLECTOMY     Social History  Substance Use Topics  . Smoking status: Never Smoker  . Smokeless tobacco: Never Used  . Alcohol use Yes     Comment:  weekly   Allergies  Allergen Reactions  . Durezol [Difluprednate]     Eye drops-swelling   Family History  Problem Relation Age of Onset  . Diabetes Mother   . Hypertension Mother   . Arthritis Mother   . Arthritis Father   . Heart disease Father   . Diabetes Sister   . Cancer Neg Hx   . Stroke Neg Hx   . Kidney disease Neg Hx   . Early death Neg Hx     Past medical history, social, surgical and family history all reviewed in  electronic medical record.   Review of Systems: No headache, visual changes, nausea, vomiting, diarrhea, constipation, dizziness, abdominal pain, skin rash, fevers, chills, night sweats, weight loss, swollen lymph nodes, body aches, joint swelling, muscle aches, chest pain, shortness of breath, mood changes.     Objective  Blood pressure 132/80, pulse (!) 56, height 5\' 9"  (1.753 m), weight 209 lb (94.8 kg), SpO2 97 %.  Systems examined below as of 12/12/16 General: NAD A&O x3 mood, affect normal  HEENT: Pupils equal, extraocular movements intact no nystagmus Respiratory: not short of breath at rest or with speaking Cardiovascular: No lower extremity edema, non tender Skin: Warm dry intact with no signs of infection or rash on extremities or on axial skeleton. Abdomen: Soft nontender, no masses Neuro: Cranial nerves  intact, neurovascularly intact in all extremities with 2+ DTRs and 2+ pulses. Lymph: No lymphadenopathy appreciated today  Gait normal with good balance and coordination.  MSK: Non tender with full range of motion and good stability and symmetric strength and tone of shoulders, elbows, wrist,  knee hips and ankles bilaterally.     Neck: Inspection unremarkable. No palpable stepoffs. Negative Spurling's maneuver. Mild limitation in side bending bilaterally. Grip strength and sensation normal in bilateral hands Strength good C4 to T1 distribution No sensory change to C4 to T1 Negative Hoffman sign bilaterally Reflexes normal   Osteopathic findings C2 flexed rotated and side bent right C4 flexed rotated and side bent left C7 flexed rotated and side bent left T3 extended rotated and side bent right inhaled third rib T9 extended rotated and side bent left L4 flexed rotated and side bent right Sacrum right on right       Impression and Recommendations:     This case required medical decision making of moderate complexity.

## 2016-12-24 ENCOUNTER — Other Ambulatory Visit: Payer: Self-pay | Admitting: Family Medicine

## 2017-01-24 ENCOUNTER — Encounter: Payer: Self-pay | Admitting: Family Medicine

## 2017-01-24 ENCOUNTER — Ambulatory Visit (INDEPENDENT_AMBULATORY_CARE_PROVIDER_SITE_OTHER): Payer: BLUE CROSS/BLUE SHIELD | Admitting: Family Medicine

## 2017-01-24 VITALS — BP 122/82 | HR 60 | Ht 69.0 in | Wt 211.0 lb

## 2017-01-24 DIAGNOSIS — M5137 Other intervertebral disc degeneration, lumbosacral region: Secondary | ICD-10-CM | POA: Diagnosis not present

## 2017-01-24 DIAGNOSIS — M999 Biomechanical lesion, unspecified: Secondary | ICD-10-CM

## 2017-01-24 NOTE — Progress Notes (Signed)
[name] [address], [place] [address] Phone: [phone] Subjective:    I'm seeing this patient by the request  of:    CC: Low back  [id number]  Daniel [name] is a 66 y.o. male coming in with complaint of low back pain. Patient has had some tightness recently. 2 weeks ago had difficulty getting out of bed. Since then overall seems to be doing better but still has tightness. When going from a sitting to any position has more discomfort. Rates the severity pain is 6 out of 10 but improving. No radiation down the legs at this moment. Has known degenerative disc disease.     Past Medical History:  Diagnosis Date  . Acne   . Anxiety   . DDD (degenerative disc disease), lumbosacral    chronic back pain  . Diverticulosis of colon (without mention of hemorrhage)    colo 09/2010  . Low back pain radiating to both legs    Past Surgical History:  Procedure Laterality Date  . CATARACT EXTRACTION Left 07/14/2015  . CYST EXCISION Left 06/23/2015   Procedure: EXCISION MASS LEFT INDEX FINGER;  Surgeon: Daryll Brod, MD;  Location: [facility];  Service: Orthopedics;  Laterality: Left;  ANESTHESIA: IV REGIONAL/FAB  . HERNIA REPAIR    . REFRACTIVE SURGERY     left eye/ with enhancement  . TONSILLECTOMY     Social History   Social History  . Marital status: Single    Spouse name: N/A  . Number of children: N/A  . Years of education: N/A   Social History Main Topics  . Smoking status: Never Smoker  . Smokeless tobacco: Never Used  . Alcohol use Yes     Comment: weekly  . Drug use: No  . Sexual activity: Not Asked   Other Topics Concern  . None   Social History Narrative   Caffienated beverages-Yes   Seat belts often-Yes   Smoke alarm in home-Yes   Firearms/guns in home-Yes   History of physical abuse-NO   HLE- Masters degree   Allergies  Allergen Reactions  . Durezol [Difluprednate]     Eye drops-swelling   Family  History  Problem Relation Age of Onset  . Diabetes Mother   . Hypertension Mother   . Arthritis Mother   . Arthritis Father   . Heart disease Father   . Diabetes Sister   . Cancer Neg Hx   . Stroke Neg Hx   . Kidney disease Neg Hx   . Early death Neg Hx      Past medical history, social, surgical and family history all reviewed in electronic medical record.  No pertanent information unless stated regarding to the chief complaint.   Review of Systems:Review of systems updated and as accurate as of 01/24/17  No headache, visual changes, nausea, vomiting, diarrhea, constipation, dizziness, abdominal pain, skin rash, fevers, chills, night sweats, weight loss, swollen lymph nodes, body aches, joint swelling, chest pain, shortness of breath, mood changes. Positive muscle aches  Objective  Height 5\' 9"  (1.753 m), weight 211 lb (95.7 kg). Systems examined below as of 01/24/17   General: No apparent distress alert and oriented x3 mood and affect normal, dressed appropriately.  HEENT: Pupils equal, extraocular movements intact  Respiratory: Patient's speak in full sentences and does not appear short of breath  Cardiovascular: No lower extremity edema, non tender, no erythema  Skin: Warm dry intact with no signs of infection or rash  on extremities or on axial skeleton.  Abdomen: Soft nontender  Neuro: Cranial nerves II through XII are intact, neurovascularly intact in all extremities with 2+ DTRs and 2+ pulses.  Lymph: No lymphadenopathy of posterior or anterior cervical chain or axillae bilaterally.  Gait normal with good balance and coordination.  MSK:  Non tender with full range of motion and good stability and symmetric strength and tone of shoulders, elbows, wrist, hip, knee and ankles bilaterally.  Neck: Inspection patient does have loss of lordosis. No palpable stepoffs. Negative Spurling's maneuver. Next last 5 of extension Grip strength and sensation normal in bilateral  hands Strength good C4 to T1 distribution No sensory change to C4 to T1 Negative Hoffman sign bilaterally Reflexes normal  Back Exam:  Inspection: Mild degenerative scoliosis Motion: Flexion 45 deg, Extension 25 deg, Side Bending to 35 deg bilaterally,  Rotation to 40 deg bilaterally  SLR laying: Negative  XSLR laying: Negative  Palpable tenderness: Tender to palpation in the paraspinal musculature lumbar spine right greater than left.Marland Kitchen FABER: Tightness on the right. Sensory change: Gross sensation intact to all lumbar and sacral dermatomes.  Reflexes: 2+ at both patellar tendons, 2+ at achilles tendons, Babinski's downgoing.  Strength at foot  Plantar-flexion: 5/5 Dorsi-flexion: 5/5 Eversion: 5/5 Inversion: 5/5  Leg strength  Quad: 5/5 Hamstring: 5/5 Hip flexor: 5/5 Hip abductors: 4/5  Gait unremarkable.  Osteopathic findings C2 flexed rotated and side bent right C4 flexed rotated and side bent left C7 flexed rotated and side bent left T3 extended rotated and side bent right inhaled third rib T8 extended rotated and side bent left L3 flexed rotated and side bent right Sacrum right on right     Impression and Recommendations:     This case required medical decision making of moderate complexity.      Note: This dictation was prepared with Dragon dictation along with smaller phrase technology. Any transcriptional errors that result from this process are unintentional.

## 2017-01-24 NOTE — Patient Instructions (Signed)
Good to see you  Duexis 3 times a day for 3 days.  Keep doing everything else See me again in 3-4 weeks to be safe [phone] to get Ria Comment  See you soon!

## 2017-01-24 NOTE — Assessment & Plan Note (Signed)
Decision today to treat with OMT was based on Physical Exam  After verbal consent patient was treated with HVLA, ME, FPR techniques in cervical, thoracic, lumbar and sacral areas  Patient tolerated the procedure well with improvement in symptoms  Patient given exercises, stretches and lifestyle modifications  See medications in patient instructions if given  Patient will follow up in 4 weeks 

## 2017-01-24 NOTE — Assessment & Plan Note (Signed)
Worsening discomfort noticed a day. Patient does have muscle relaxer. We will refill and sneezing. Topical anti-inflammatories and given a short course of oral anti-inflammatories. We discussed icing regimen, no need for any other imaging. Responded fairly well to osteopathic manipulation. Follow-up again in 3-4 weeks.

## 2017-01-31 ENCOUNTER — Ambulatory Visit (INDEPENDENT_AMBULATORY_CARE_PROVIDER_SITE_OTHER): Payer: BLUE CROSS/BLUE SHIELD | Admitting: Neurology

## 2017-01-31 ENCOUNTER — Other Ambulatory Visit: Payer: Self-pay | Admitting: Neurology

## 2017-01-31 ENCOUNTER — Encounter: Payer: Self-pay | Admitting: Neurology

## 2017-01-31 VITALS — BP 134/82 | HR 60 | Ht 69.0 in | Wt 214.9 lb

## 2017-01-31 DIAGNOSIS — R51 Headache: Secondary | ICD-10-CM | POA: Diagnosis not present

## 2017-01-31 DIAGNOSIS — G478 Other sleep disorders: Secondary | ICD-10-CM

## 2017-01-31 DIAGNOSIS — G4486 Cervicogenic headache: Secondary | ICD-10-CM

## 2017-01-31 DIAGNOSIS — R519 Headache, unspecified: Secondary | ICD-10-CM

## 2017-01-31 MED ORDER — AMITRIPTYLINE HCL 10 MG PO TABS: 20.0000 mg | ORAL_TABLET | Freq: Every day | ORAL | 3 refills | Status: DC

## 2017-01-31 NOTE — Patient Instructions (Addendum)
1.  Increase amitriptyline to 20mg  at bedtime 2.  I will refer you for a sleep study to evaluate for sleep apnea 3.  Follow up in 5 months.

## 2017-01-31 NOTE — Progress Notes (Addendum)
NEUROLOGY FOLLOW UP OFFICE NOTE  Daniel [name] [id number]  HISTORY OF PRESENT ILLNESS: Daniel [name] is a 65 year old right-handed man with no significant past medical history who follows up for cervicogenic headache.   UPDATE:  He takes amitriptyline 10mg  daily.  He was prescribed tizanidine 2mg  for neck tension.  He is also treated by Dr. Tamala Julian with OMT.   He reports about 6 or 7 right posterior headaches a month, lasting 30 to 60 minutes and treated with ibuprofen.  Occasionally he has a new headache which began about 3 months ago, a sharp moderate intensity right temporal pain.  He notes some increased trouble with night vision.  There are no specific triggers.  Ibuprofen helps.  He has increased stress.  He was in the hospital in June for chest pain and palpitations.  He was found to have a run of atrial fibrillation for unknown reason.  Cardiac workup was negative.  He reports increased difficulty with sleep.  He falls asleep within 30 minutes but wakes up several times during the night for unknown reason.  He doesn't have to go to the bathroom.  He feels fatigued during the day.   HISTORY:  In July 2017, he developed right sided pain, moderate intensity, at the base of his skull with neck stiffness.  He denied any preceding event that triggered it.  It is in the right suboccipital region and radiates up the back of his head on the right.  It feels swollen.  He also has right paraspinal tenderness radiating into the trapezius.  Neck movement in all directions exacerbates it.  He denies radicular pain or weakness in the arm, but he reports occasional numbness and tingling in the right hand and forearm, noticeable when sitting in his chair at night.  MRI of cervical spine from 12/20/15 was personally reviewed and revealed mild to moderate cervical degenerative disc disease but no stenosis or nerve root compression.  PAST MEDICAL HISTORY: Past Medical History:  Diagnosis Date  . Acne   .  Anxiety   . DDD (degenerative disc disease), lumbosacral    chronic back pain  . Diverticulosis of colon (without mention of hemorrhage)    colo 09/2010  . Low back pain radiating to both legs     MEDICATIONS: Current Outpatient Prescriptions on File Prior to Visit  Medication Sig Dispense Refill  . acetaminophen (TYLENOL) 500 MG tablet Take 1,000 mg by mouth every 6 (six) hours as needed for mild pain.    Marland Kitchen allopurinol (ZYLOPRIM) 100 MG tablet Take 2 tablets (200 mg total) by mouth daily. 60 tablet 6  . Diclofenac Sodium (PENNSAID) 2 % SOLN Place 2 application onto the skin 2 (two) times daily. (Patient taking differently: Place 1 application onto the skin 2 (two) times daily. ) 112 g 3  . hydrOXYzine (ATARAX/VISTARIL) 10 MG tablet Take 1 tablet (10 mg total) by mouth 3 (three) times daily as needed. (Patient taking differently: Take 10 mg by mouth daily. ) 270 tablet 1  . Multiple Vitamins-Minerals (MULTIVITAMIN PO) Take 1 tablet by mouth at bedtime.     Marland Kitchen tiZANidine (ZANAFLEX) 2 MG tablet Take 2 mg by mouth 3 (three) times daily as needed for muscle spasms.     No current facility-administered medications on file prior to visit.     ALLERGIES: Allergies  Allergen Reactions  . Durezol [Difluprednate]     Eye drops-swelling    FAMILY HISTORY: Family History  Problem Relation Age of Onset  .  Diabetes Mother   . Hypertension Mother   . Arthritis Mother   . Arthritis Father   . Heart disease Father   . Diabetes Sister   . Cancer Neg Hx   . Stroke Neg Hx   . Kidney disease Neg Hx   . Early death Neg Hx     SOCIAL HISTORY: Social History   Social History  . Marital status: Single    Spouse name: N/A  . Number of children: N/A  . Years of education: N/A   Occupational History  . Not on file.   Social History Main Topics  . Smoking status: Never Smoker  . Smokeless tobacco: Never Used  . Alcohol use Yes     Comment: weekly  . Drug use: No  . Sexual activity: Not  on file   Other Topics Concern  . Not on file   Social History Narrative   Caffienated beverages-Yes   Seat belts often-Yes   Smoke alarm in home-Yes   Firearms/guns in home-Yes   History of physical abuse-NO   HLE- Masters degree    REVIEW OF SYSTEMS: Constitutional: No fevers, chills, or sweats, no generalized fatigue, change in appetite Eyes: No visual changes, double vision, eye pain Ear, nose and throat: No hearing loss, ear pain, nasal congestion, sore throat Cardiovascular: No chest pain, palpitations Respiratory:  No shortness of breath at rest or with exertion, wheezes GastrointestinaI: No nausea, vomiting, diarrhea, abdominal pain, fecal incontinence Genitourinary:  No dysuria, urinary retention or frequency Musculoskeletal:  No neck pain, back pain Integumentary: No rash, pruritus, skin lesions Neurological: as above Psychiatric: No depression, insomnia, anxiety Endocrine: No palpitations, fatigue, diaphoresis, mood swings, change in appetite, change in weight, increased thirst Hematologic/Lymphatic:  No purpura, petechiae. Allergic/Immunologic: no itchy/runny eyes, nasal congestion, recent allergic reactions, rashes  PHYSICAL EXAM: Vitals:   01/31/17 1005  BP: 134/82  Pulse: 60  SpO2: 96%   General: No acute distress.  Patient appears well-groomed.   Head:  Normocephalic/atraumatic Eyes:  Fundi examined but not visualized Neck: supple, right suboccipital tenderness, no paraspinal tenderness, full range of motion Heart:  Regular rate and rhythm Lungs:  Clear to auscultation bilaterally Back: No paraspinal tenderness Neurological Exam: alert and oriented to person, place, and time. Attention span and concentration intact, recent and remote memory intact, fund of knowledge intact.  Speech fluent and not dysarthric, language intact.  CN II-XII intact. Bulk and tone normal, muscle strength 5/5 throughout.  Sensation to light touch, temperature and vibration intact.   Deep tendon reflexes 2+ throughout, toes downgoing.  Finger to nose and heel to shin testing intact.  Gait normal, Romberg negative.  IMPRESSION: 1.  Cervicogenic headache 2.  Right temporal headache, likely tension but since it is new, will check Sed Rate 3.  Poor sleep pattern, frequently waking up and fatigued during the day.  Consider OSA  PLAN: Increase amitriptyline to 20mg  at bedtime Refer for sleep study to evaluate for OSA Check Sed Rate  ADDENDUM:  Sed Rate from 01/31/17 was 6. Follow up in 5 months.  Metta Clines, DO  CC:  Scarlette Calico, MD

## 2017-02-09 ENCOUNTER — Other Ambulatory Visit: Payer: Self-pay | Admitting: Neurology

## 2017-02-13 ENCOUNTER — Encounter: Payer: Self-pay | Admitting: Family Medicine

## 2017-02-13 ENCOUNTER — Ambulatory Visit (INDEPENDENT_AMBULATORY_CARE_PROVIDER_SITE_OTHER): Payer: BLUE CROSS/BLUE SHIELD | Admitting: Family Medicine

## 2017-02-13 VITALS — BP 140/90 | HR 60 | Ht 69.0 in | Wt 214.0 lb

## 2017-02-13 DIAGNOSIS — M999 Biomechanical lesion, unspecified: Secondary | ICD-10-CM

## 2017-02-13 DIAGNOSIS — M5137 Other intervertebral disc degeneration, lumbosacral region: Secondary | ICD-10-CM

## 2017-02-13 NOTE — Assessment & Plan Note (Signed)
Stable. Discussed with patient again about the importance of core strength. We'll continue same regimen at this time with the home exercises as well as the medications. If any worsening symptoms we'll consider other treatment options. Patient encouraged to stay active. Follow-up again in 4-6 weeks

## 2017-02-13 NOTE — Progress Notes (Signed)
[name] [place], [place] [address] Phone: [phone] Subjective:    I'm seeing this patient by the request  of:    CC: Back pain follow-up  [id number]  Daniel [name] is a 66 y.o. male coming in with complaint of back pain. Says his back is doing slightly better than last time. Continues to make progress. Does try to increase activity as tolerated. Does have known chronic degenerative disc disease. No radicular symptoms at this time. His been the primary caregiver for his mother recently. She is been nailing with back pain and has been in the emergency room twice in the last week.     Past Medical History:  Diagnosis Date  . Acne   . Anxiety   . DDD (degenerative disc disease), lumbosacral    chronic back pain  . Diverticulosis of colon (without mention of hemorrhage)    colo 09/2010  . Low back pain radiating to both legs    Past Surgical History:  Procedure Laterality Date  . CATARACT EXTRACTION Left 07/14/2015  . CYST EXCISION Left 06/23/2015   Procedure: EXCISION MASS LEFT INDEX FINGER;  Surgeon: Daryll Brod, MD;  Location: [facility];  Service: Orthopedics;  Laterality: Left;  ANESTHESIA: IV REGIONAL/FAB  . HERNIA REPAIR    . REFRACTIVE SURGERY     left eye/ with enhancement  . TONSILLECTOMY     Social History   Social History  . Marital status: Single    Spouse name: N/A  . Number of children: N/A  . Years of education: N/A   Social History Main Topics  . Smoking status: Never Smoker  . Smokeless tobacco: Never Used  . Alcohol use Yes     Comment: weekly  . Drug use: No  . Sexual activity: Not Asked   Other Topics Concern  . None   Social History Narrative   Caffienated beverages-Yes   Seat belts often-Yes   Smoke alarm in home-Yes   Firearms/guns in home-Yes   History of physical abuse-NO   HLE- Masters degree   Allergies  Allergen Reactions  . Durezol [Difluprednate]     Eye  drops-swelling   Family History  Problem Relation Age of Onset  . Diabetes Mother   . Hypertension Mother   . Arthritis Mother   . Arthritis Father   . Heart disease Father   . Diabetes Sister   . Cancer Neg Hx   . Stroke Neg Hx   . Kidney disease Neg Hx   . Early death Neg Hx      Past medical history, social, surgical and family history all reviewed in electronic medical record.  No pertanent information unless stated regarding to the chief complaint.   Review of Systems:Review of systems updated and as accurate as of 02/13/17  No headache, visual changes, nausea, vomiting, diarrhea, constipation, dizziness, abdominal pain, skin rash, fevers, chills, night sweats, weight loss, swollen lymph nodes, body aches, joint swelling, chest pain, shortness of breath, mood changes. Positive muscle aches  Objective  Blood pressure 140/90, pulse 60, height 5\' 9"  (1.753 m), weight 214 lb (97.1 kg), SpO2 95 %. Systems examined below as of 02/13/17   General: No apparent distress alert and oriented x3 mood and affect normal, dressed appropriately.  HEENT: Pupils equal, extraocular movements intact  Respiratory: Patient's speak in full sentences and does not appear short of breath  Cardiovascular: No lower extremity edema, non tender, no erythema  Skin: Warm dry  intact with no signs of infection or rash on extremities or on axial skeleton.  Abdomen: Soft nontender  Neuro: Cranial nerves II through XII are intact, neurovascularly intact in all extremities with 2+ DTRs and 2+ pulses.  Lymph: No lymphadenopathy of posterior or anterior cervical chain or axillae bilaterally.  Gait normal with good balance and coordination.  MSK:  Non tender with full range of motion and good stability and symmetric strength and tone of shoulders, elbows, wrist, hip, knee and ankles bilaterally.  Back Exam:  Inspection: Mild loss of lordosis noted. Motion: Flexion 45 deg, Extension 25 deg, Side Bending to 45 deg  bilaterally,  Rotation to 45 deg bilaterally  SLR laying: Negative  XSLR laying: Negative  Palpable tenderness: Tender to palpation more in the thoracolumbar and lumbosacral areas right greater than left. FABER: negative. Sensory change: Gross sensation intact to all lumbar and sacral dermatomes.  Reflexes: 2+ at both patellar tendons, 2+ at achilles tendons, Babinski's downgoing.  Strength at foot  Plantar-flexion: 5/5 Dorsi-flexion: 5/5 Eversion: 5/5 Inversion: 5/5  Leg strength  Quad: 5/5 Hamstring: 5/5 Hip flexor: 5/5 Hip abductors: 5/5  Gait unremarkable.  Osteopathic findings C3 flexed rotated and side bent left T3 extended rotated and side bent right inhaled third rib T5 extended rotated and side bent left L2 flexed rotated and side bent right Sacrum right on right     Impression and Recommendations:     This case required medical decision making of moderate complexity.      Note: This dictation was prepared with Dragon dictation along with smaller phrase technology. Any transcriptional errors that result from this process are unintentional.

## 2017-02-13 NOTE — Assessment & Plan Note (Addendum)
Decision today to treat with OMT was based on Physical Exam  After verbal consent patient was treated with HVLA, ME, FPR techniques in cervical, thoracic, rib lumbar and sacral areas  Patient tolerated the procedure well with improvement in symptoms  Patient given exercises, stretches and lifestyle modifications  See medications in patient instructions if given  Patient will follow up in 4-6 weeks 

## 2017-02-13 NOTE — Patient Instructions (Addendum)
Good to see you  Daniel [name] is your friend.  Stay active  Call [phone] about your mom and we will see what is going on with your mom.  You see me again in 4 weeks.

## 2017-02-14 LAB — SEDIMENTATION RATE: SED RATE: 6 mm/h (ref 0–20)

## 2017-02-15 ENCOUNTER — Telehealth: Payer: Self-pay

## 2017-02-15 NOTE — Telephone Encounter (Signed)
-----   Message from Pieter Partridge, DO sent at 02/15/2017  7:22 AM EDT ----- Lab is normal

## 2017-02-15 NOTE — Telephone Encounter (Signed)
Called Pt, advsd of normal sed rate

## 2017-03-12 NOTE — Progress Notes (Signed)
[name] [name], [place] [address] Phone: [phone] Subjective:    CC: Neck and back pain  [id number]  Daniel [name] is a 66 y.o. male coming in with complaint of neck and back pain. Has done relatively well at this time. Patient has had some aches and pains. Denies any other significant discomfort or pain. Has muscle relaxer when needed. Patient states Doing relatively well. Some mild tightness. Nothing severe. Continues to be active though. Patient did have very busy week but states that this did not make any significant exacerbation of the back.     Past Medical History:  Diagnosis Date  . Acne   . Anxiety   . DDD (degenerative disc disease), lumbosacral    chronic back pain  . Diverticulosis of colon (without mention of hemorrhage)    colo 09/2010  . Low back pain radiating to both legs    Past Surgical History:  Procedure Laterality Date  . CATARACT EXTRACTION Left 07/14/2015  . CYST EXCISION Left 06/23/2015   Procedure: EXCISION MASS LEFT INDEX FINGER;  Surgeon: Daryll Brod, MD;  Location: [facility];  Service: Orthopedics;  Laterality: Left;  ANESTHESIA: IV REGIONAL/FAB  . HERNIA REPAIR    . REFRACTIVE SURGERY     left eye/ with enhancement  . TONSILLECTOMY     Social History   Social History  . Marital status: Single    Spouse name: N/A  . Number of children: N/A  . Years of education: N/A   Social History Main Topics  . Smoking status: Never Smoker  . Smokeless tobacco: Never Used  . Alcohol use Yes     Comment: weekly  . Drug use: No  . Sexual activity: Not Asked   Other Topics Concern  . None   Social History Narrative   Caffienated beverages-Yes   Seat belts often-Yes   Smoke alarm in home-Yes   Firearms/guns in home-Yes   History of physical abuse-NO   HLE- Masters degree   Allergies  Allergen Reactions  . Durezol [Difluprednate]     Eye drops-swelling   Family History    Problem Relation Age of Onset  . Diabetes Mother   . Hypertension Mother   . Arthritis Mother   . Arthritis Father   . Heart disease Father   . Diabetes Sister   . Cancer Neg Hx   . Stroke Neg Hx   . Kidney disease Neg Hx   . Early death Neg Hx      Past medical history, social, surgical and family history all reviewed in electronic medical record.  No pertanent information unless stated regarding to the chief complaint.   Review of Systems:Review of systems updated and as accurate as of 03/13/17  No headache, visual changes, nausea, vomiting, diarrhea, constipation, dizziness, abdominal pain, skin rash, fevers, chills, night sweats, weight loss, swollen lymph nodes, body aches, joint swelling, chest pain, shortness of breath, mood changes. Positive muscle aches  Objective  Blood pressure 136/84, pulse 65, height 5\' 9"  (1.753 m), weight 214 lb (97.1 kg), SpO2 93 %. Systems examined below as of 03/13/17   General: No apparent distress alert and oriented x3 mood and affect normal, dressed appropriately.  HEENT: Pupils equal, extraocular movements intact  Respiratory: Patient's speak in full sentences and does not appear short of breath  Cardiovascular: No lower extremity edema, non tender, no erythema  Skin: Warm dry intact with no signs of infection or rash on extremities  or on axial skeleton.  Abdomen: Soft nontender  Neuro: Cranial nerves II through XII are intact, neurovascularly intact in all extremities with 2+ DTRs and 2+ pulses.  Lymph: No lymphadenopathy of posterior or anterior cervical chain or axillae bilaterally.  Gait normal with good balance and coordination.  MSK:  Non tender with full range of motion and good stability and symmetric strength and tone of shoulders, elbows, wrist, hip, knee and ankles bilaterally.  Back Exam:  Inspection: Mild loss in lordosis Motion: Flexion 35 deg, Extension 25 deg, Side Bending to 35 deg bilaterally,  Rotation to 35 deg  bilaterally  SLR laying: Negative  XSLR laying: Negative  Palpable tenderness: Tender to palpation in the paraspinal musculature lumbar spine. FABER: Mild tightness bilaterally. Sensory change: Gross sensation intact to all lumbar and sacral dermatomes.  Reflexes: 2+ at both patellar tendons, 2+ at achilles tendons, Babinski's downgoing.  Strength at foot  Plantar-flexion: 5/5 Dorsi-flexion: 5/5 Eversion: 5/5 Inversion: 5/5  Leg strength  Quad: 5/5 Hamstring: 5/5 Hip flexor: 5/5 Hip abductors: 5/5  Gait unremarkable.  Osteopathic findings C2 flexed rotated and side bent right C4 flexed rotated and side bent left C7 flexed rotated and side bent left T3 extended rotated and side bent right inhaled third rib T6 extended rotated and side bent left L2 flexed rotated and side bent right Sacrum right on right     Impression and Recommendations:     This case required medical decision making of moderate complexity.      Note: This dictation was prepared with Dragon dictation along with smaller phrase technology. Any transcriptional errors that result from this process are unintentional.

## 2017-03-13 ENCOUNTER — Ambulatory Visit (INDEPENDENT_AMBULATORY_CARE_PROVIDER_SITE_OTHER): Payer: BLUE CROSS/BLUE SHIELD | Admitting: Family Medicine

## 2017-03-13 ENCOUNTER — Encounter: Payer: Self-pay | Admitting: Family Medicine

## 2017-03-13 VITALS — BP 136/84 | HR 65 | Ht 69.0 in | Wt 214.0 lb

## 2017-03-13 DIAGNOSIS — M5137 Other intervertebral disc degeneration, lumbosacral region: Secondary | ICD-10-CM

## 2017-03-13 DIAGNOSIS — M999 Biomechanical lesion, unspecified: Secondary | ICD-10-CM

## 2017-03-13 NOTE — Assessment & Plan Note (Signed)
Decision today to treat with OMT was based on Physical Exam  After verbal consent patient was treated with HVLA, ME, FPR techniques in cervical, thoracic, rib, lumbar and sacral areas  Patient tolerated the procedure well with improvement in symptoms  Patient given exercises, stretches and lifestyle modifications  See medications in patient instructions if given  Patient will follow up in 6 weeks 

## 2017-03-13 NOTE — Assessment & Plan Note (Signed)
Stable lateral. Discussed with patient at great length. We discussed icing regimen and home exercises. Discussed core stability again. Patient is doing relatively well and we will move frequency to 6 week intervals.

## 2017-03-13 NOTE — Patient Instructions (Signed)
Good to see you  Overall doing great  Don't change a thing.  Keep it up  See me again in 6 weeks.

## 2017-03-17 ENCOUNTER — Other Ambulatory Visit: Payer: Self-pay | Admitting: Neurology

## 2017-03-17 IMAGING — CT CT ABD-PELV W/ CM
2 of 5 series · 16 of 46 positions shown, 18 images · IV contrast (omnipaque)
Comparison: 01/01/2011

CLINICAL DATA: Lower abdominal pain and nausea for 2 days. Personal
history of diverticulitis.

EXAM:
CT ABDOMEN AND PELVIS WITH CONTRAST
TECHNIQUE: Multidetector CT imaging of the abdomen and pelvis was performed
using the standard protocol following bolus administration of
intravenous contrast.
CONTRAST:  100mL OMNIPAQUE IOHEXOL 300 MG/ML  SOLN

[Series 2: axial st · axial · 0.90mm/px · z∈[-455,-40]mm · 13 of 95 slices shown, 15 images]
[im 6/95  soft-tissue]
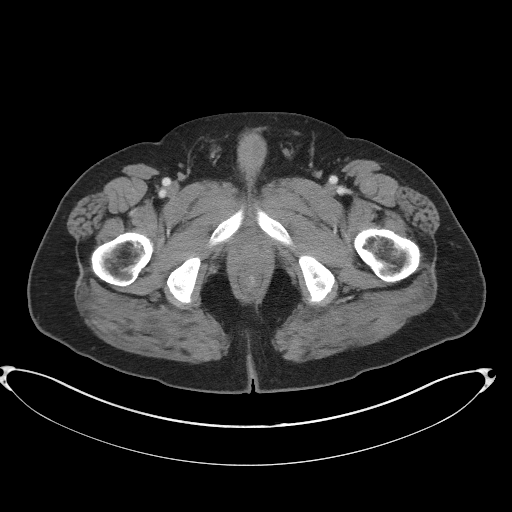
[im 6/95  bone]
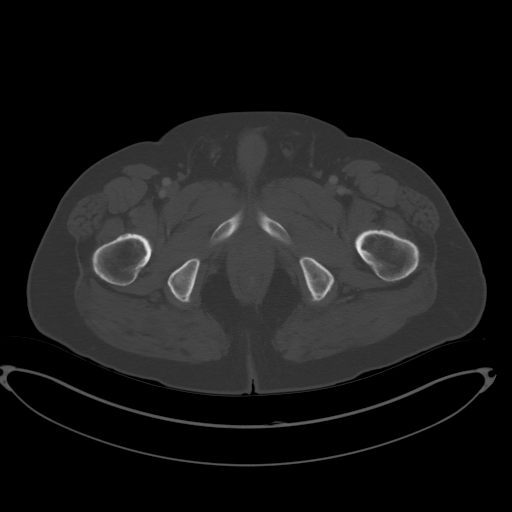
[im 11/95  soft-tissue]
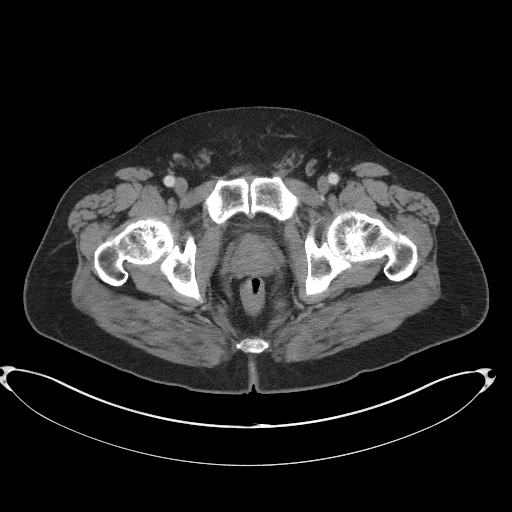
[im 21/95  soft-tissue]
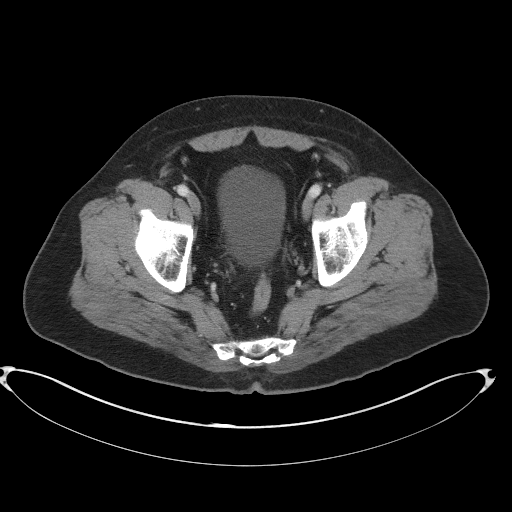
[im 27/95  soft-tissue]
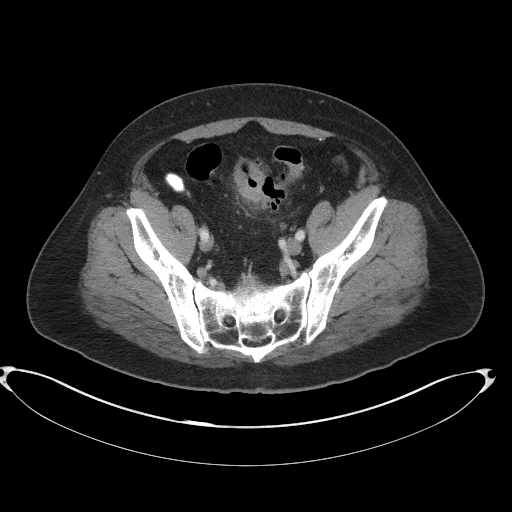
[im 32/95  soft-tissue]
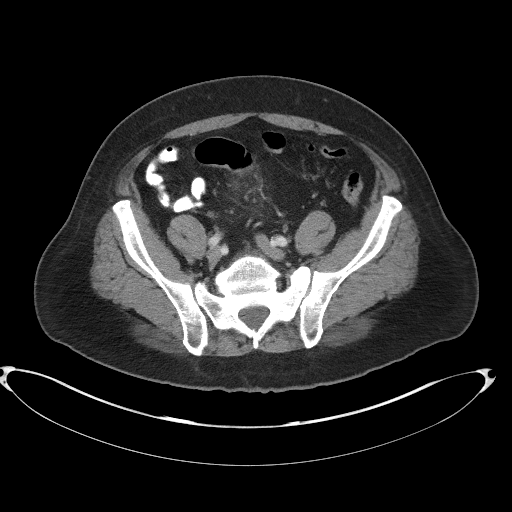
[im 42/95  soft-tissue]
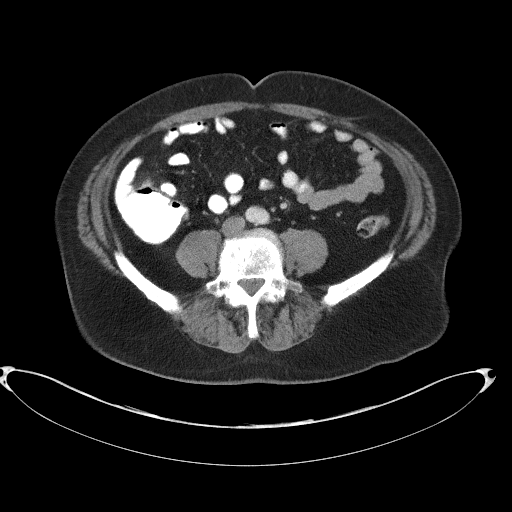
[im 48/95  soft-tissue]
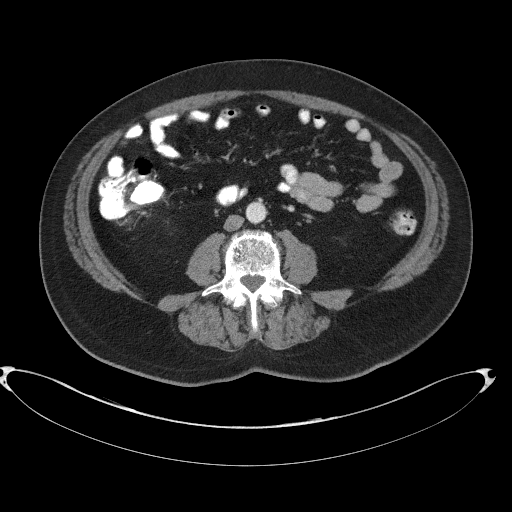
[im 53/95  soft-tissue]
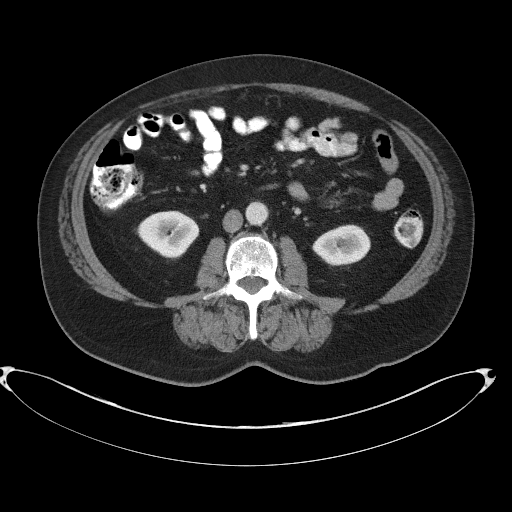
[im 63/95  soft-tissue]
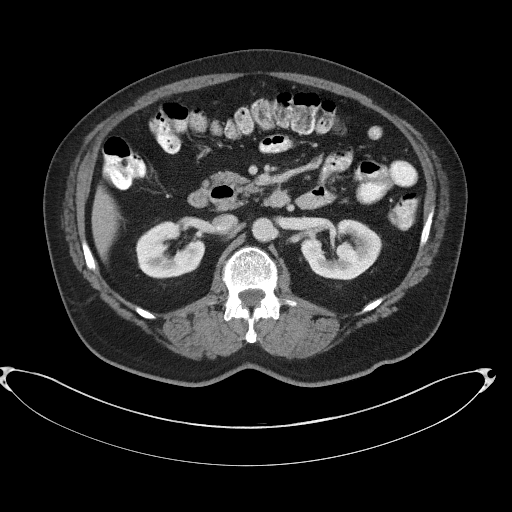
[im 63/95  bone]
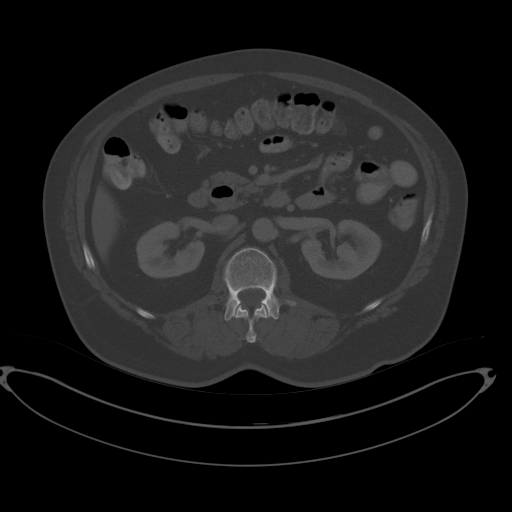
[im 68/95  soft-tissue]
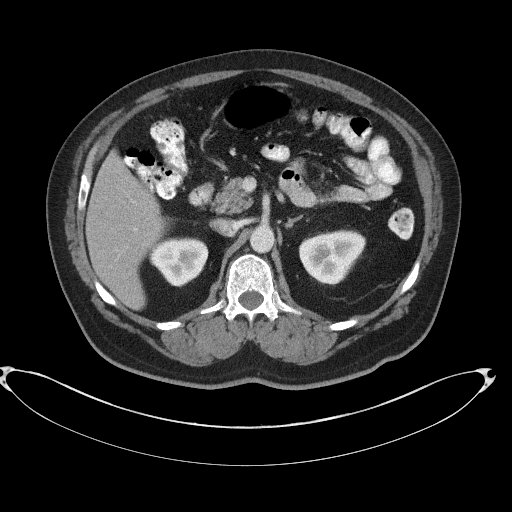
[im 74/95  soft-tissue]
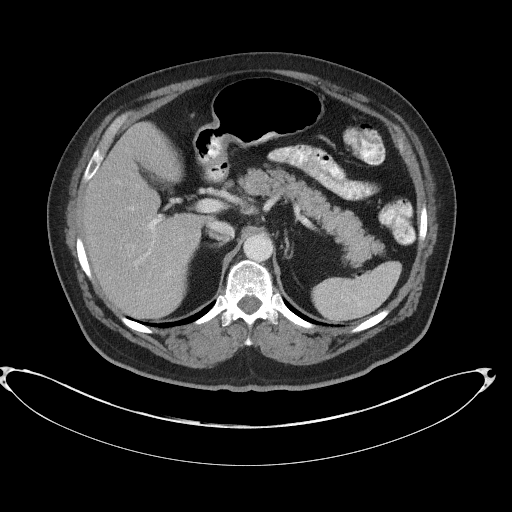
[im 84/95  soft-tissue]
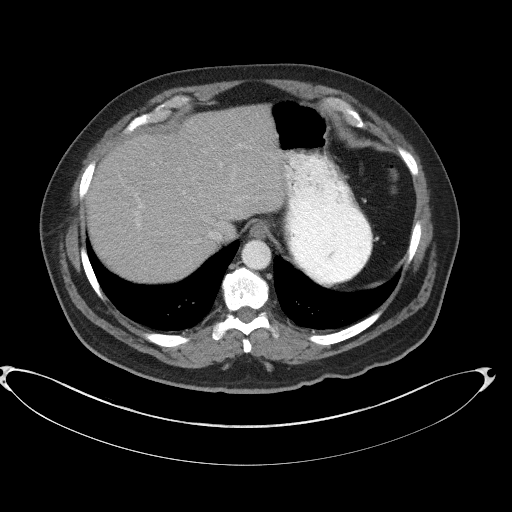
[im 89/95  soft-tissue]
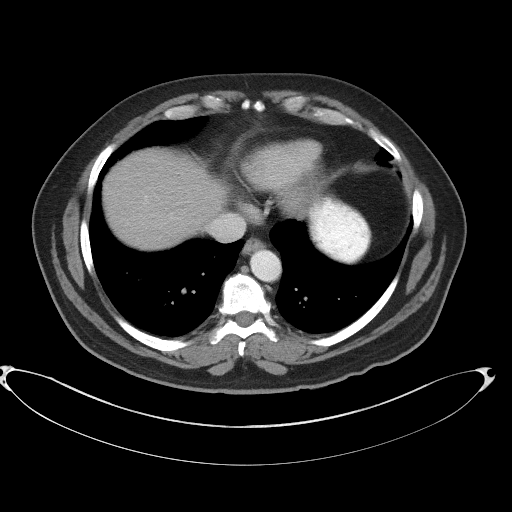

[Series 5: coronal st · coronal · 0.81mm/px · 3 of 98 slices shown]
[im 33/98  soft-tissue]
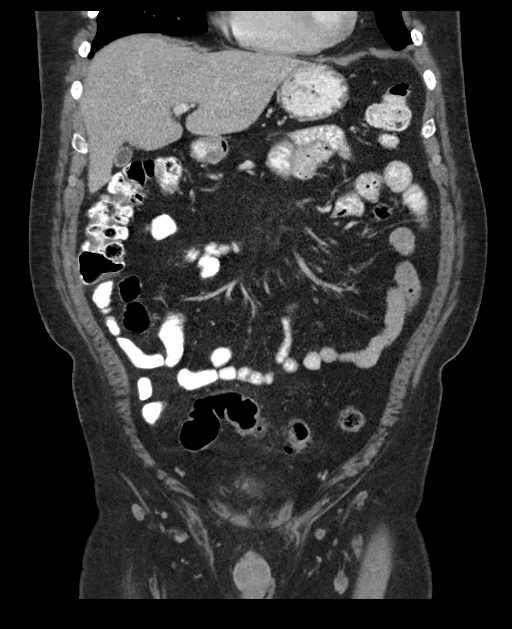
[im 44/98  soft-tissue]
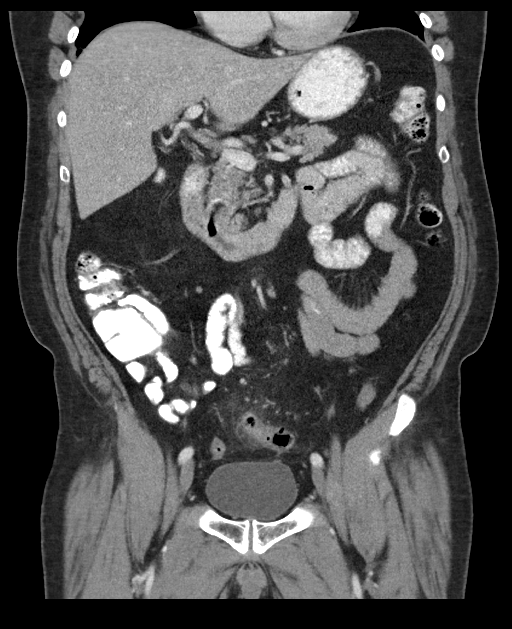
[im 54/98  soft-tissue]
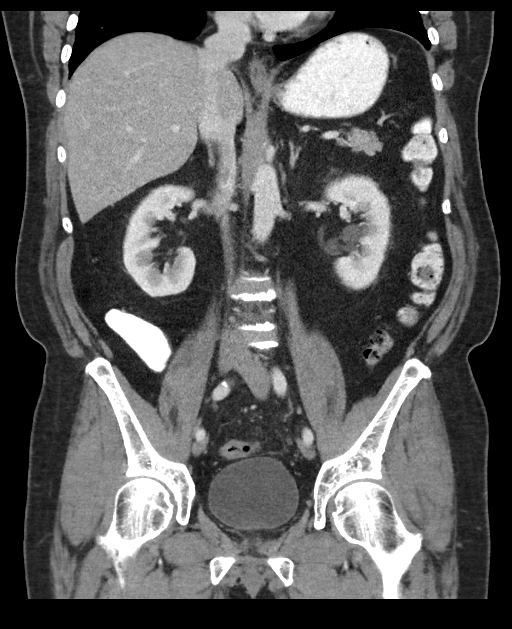

[16 of 46 positions shown; findings below may reference images not displayed]

FINDINGS: Lower chest:  No acute findings.

Hepatobiliary: No masses or other significant abnormality.
Gallbladder is unremarkable.

Pancreas: No mass, inflammatory changes, or other significant
abnormality.

Spleen: Within normal limits in size and appearance.

Adrenals/Urinary Tract: No masses identified. No evidence of
hydronephrosis.

Stomach/Bowel: No evidence of obstruction, inflammatory process, or
abnormal fluid collections. Small proximal duodenal diverticulum
incidentally noted. Normal appendix visualized. Moderate
diverticulitis is seen involving the mid sigmoid colon. There is no
evidence of abscess or other complication.

Vascular/Lymphatic: No pathologically enlarged lymph nodes. No
evidence of abdominal aortic aneurysm.

Reproductive: No mass or other significant abnormality.

Other: None.

Musculoskeletal:  No suspicious bone lesions identified.
IMPRESSION: Moderate diverticulitis involving the mid sigmoid colon. No evidence
of abscess or other complication.

## 2017-04-09 ENCOUNTER — Other Ambulatory Visit: Payer: Self-pay | Admitting: Family Medicine

## 2017-04-12 ENCOUNTER — Other Ambulatory Visit: Payer: Self-pay | Admitting: Family Medicine

## 2017-04-12 NOTE — Telephone Encounter (Signed)
Refill done.  

## 2017-04-19 ENCOUNTER — Telehealth: Payer: Self-pay | Admitting: Family Medicine

## 2017-04-19 NOTE — Telephone Encounter (Signed)
Spoke to pt, scheduled him the same day.

## 2017-04-19 NOTE — Telephone Encounter (Signed)
Copied from [place] [id number]. Topic: Appointment Scheduling - Scheduling Inquiry for Clinic >> Apr 19, 2017 10:43 AM Daniel [name], NT wrote: Reason for CRM: Pt called in to see if he could change his appointment time with Dr. Tamala Julian the same time as his mother Daniel [name] on 12/17 @10 :41 with Dr. Tamala Julian. Didn't see any availability that fits that same time so he wanted to know if he could be worked on the same day. Pt brings his mother to appointments and would like a call back

## 2017-04-19 NOTE — Telephone Encounter (Signed)
See message below. Would you like to work him in? Please advise.

## 2017-04-24 ENCOUNTER — Ambulatory Visit: Payer: BLUE CROSS/BLUE SHIELD | Admitting: Family Medicine

## 2017-05-04 ENCOUNTER — Other Ambulatory Visit: Payer: Self-pay | Admitting: Neurology

## 2017-05-06 NOTE — Progress Notes (Signed)
[name] [place], [place] [address] Phone: [phone] Subjective:     CC: Back pain  [id number]  Daniel [name] is a 66 y.o. male coming in with complaint of back pain follow-up.  Patient has been doing relatively well but has had increasing stress recently.  Patient's mother is ill as well as his father.  Patient has felt significantly stressed because of this.  Not doing the exercises much.  Worsening symptoms overall.  Known moderate degenerative disc disease of the lumbar spine    Past Medical History:  Diagnosis Date  . Acne   . Anxiety   . DDD (degenerative disc disease), lumbosacral    chronic back pain  . Diverticulosis of colon (without mention of hemorrhage)    colo 09/2010  . Low back pain radiating to both legs    Past Surgical History:  Procedure Laterality Date  . CATARACT EXTRACTION Left 07/14/2015  . CYST EXCISION Left 06/23/2015   Procedure: EXCISION MASS LEFT INDEX FINGER;  Surgeon: Daryll Brod, MD;  Location: [facility];  Service: Orthopedics;  Laterality: Left;  ANESTHESIA: IV REGIONAL/FAB  . HERNIA REPAIR    . REFRACTIVE SURGERY     left eye/ with enhancement  . TONSILLECTOMY     Social History   Socioeconomic History  . Marital status: Single    Spouse name: None  . Number of children: None  . Years of education: None  . Highest education level: None  Social Needs  . Financial resource strain: None  . Food insecurity - worry: None  . Food insecurity - inability: None  . Transportation needs - medical: None  . Transportation needs - non-medical: None  Occupational History  . None  Tobacco Use  . Smoking status: Never Smoker  . Smokeless tobacco: Never Used  Substance and Sexual Activity  . Alcohol use: Yes    Comment: weekly  . Drug use: No  . Sexual activity: None  Other Topics Concern  . None  Social History Narrative   Caffienated beverages-Yes   Seat belts often-Yes   Smoke alarm in home-Yes   Firearms/guns in home-Yes   History of physical abuse-NO   HLE- Masters degree   Allergies  Allergen Reactions  . Durezol [Difluprednate]     Eye drops-swelling   Family History  Problem Relation Age of Onset  . Diabetes Mother   . Hypertension Mother   . Arthritis Mother   . Arthritis Father   . Heart disease Father   . Diabetes Sister   . Cancer Neg Hx   . Stroke Neg Hx   . Kidney disease Neg Hx   . Early death Neg Hx      Past medical history, social, surgical and family history all reviewed in electronic medical record.  No pertanent information unless stated regarding to the chief complaint.   Review of Systems:Review of systems updated and as accurate as of 05/08/17  No headache, visual changes, nausea, vomiting, diarrhea, constipation, dizziness, abdominal pain, skin rash, fevers, chills, night sweats, weight loss, swollen lymph nodes, body aches, joint swelling, chest pain, shortness of breath, mood changes.  Positive muscle aches  Objective  Blood pressure 120/84, pulse (!) 56, height 5\' 9"  (1.753 m), weight 215 lb (97.5 kg), SpO2 98 %. Systems examined below as of 05/08/17   General: No apparent distress alert and oriented x3 mood and affect normal, dressed appropriately.  HEENT: Pupils equal, extraocular movements intact  Respiratory:  Patient's speak in full sentences and does not appear short of breath  Cardiovascular: No lower extremity edema, non tender, no erythema  Skin: Warm dry intact with no signs of infection or rash on extremities or on axial skeleton.  Abdomen: Soft nontender  Neuro: Cranial nerves II through XII are intact, neurovascularly intact in all extremities with 2+ DTRs and 2+ pulses.  Lymph: No lymphadenopathy of posterior or anterior cervical chain or axillae bilaterally.  Gait normal with good balance and coordination.  MSK:  Non tender with full range of motion and good stability and symmetric strength and tone of  shoulders, elbows, wrist, hip, knee and ankles bilaterally.  Back Exam:  Inspection: Loss of lordosis Motion: Flexion 30 deg, Extension 25 deg, Side Bending to 35 deg bilaterally,  Rotation to 45 deg bilaterally  SLR laying: Negative  XSLR laying: Negative  Palpable tenderness: Tender to palpation in the paraspinal musculature of lumbar spine. FABER: Tightness bilaterally. Sensory change: Gross sensation intact to all lumbar and sacral dermatomes.  Reflexes: 2+ at both patellar tendons, 2+ at achilles tendons, Babinski's downgoing.  Strength at foot  Plantar-flexion: 5/5 Dorsi-flexion: 5/5 Eversion: 5/5 Inversion: 5/5  Leg strength  Quad: 5/5 Hamstring: 5/5 Hip flexor: 5/5 Hip abductors: 5/5  Gait unremarkable.  Osteopathic findings C2 flexed rotated and side bent right C4 flexed rotated and side bent left C7 flexed rotated and side bent left T3 extended rotated and side bent right inhaled third rib T9 extended rotated and side bent left L3 flexed rotated and side bent right Sacrum right on right    Impression and Recommendations:     This case required medical decision making of moderate complexity.      Note: This dictation was prepared with Dragon dictation along with smaller phrase technology. Any transcriptional errors that result from this process are unintentional.

## 2017-05-08 ENCOUNTER — Ambulatory Visit (INDEPENDENT_AMBULATORY_CARE_PROVIDER_SITE_OTHER): Payer: BLUE CROSS/BLUE SHIELD | Admitting: Family Medicine

## 2017-05-08 ENCOUNTER — Encounter: Payer: Self-pay | Admitting: Family Medicine

## 2017-05-08 VITALS — BP 120/84 | HR 56 | Ht 69.0 in | Wt 215.0 lb

## 2017-05-08 DIAGNOSIS — M999 Biomechanical lesion, unspecified: Secondary | ICD-10-CM

## 2017-05-08 DIAGNOSIS — M5137 Other intervertebral disc degeneration, lumbosacral region: Secondary | ICD-10-CM | POA: Diagnosis not present

## 2017-05-08 MED ORDER — METHYLPREDNISOLONE ACETATE 80 MG/ML IJ SUSP
80.0000 mg | Freq: Once | INTRAMUSCULAR | Status: AC
  Administered 2017-05-08: 80 mg via INTRAMUSCULAR

## 2017-05-08 MED ORDER — KETOROLAC TROMETHAMINE 60 MG/2ML IM SOLN
60.0000 mg | Freq: Once | INTRAMUSCULAR | Status: AC
  Administered 2017-05-08: 60 mg via INTRAMUSCULAR

## 2017-05-08 NOTE — Assessment & Plan Note (Signed)
Decision today to treat with OMT was based on Physical Exam  After verbal consent patient was treated with HVLA, ME, FPR techniques in cervical, thoracic, rib,  lumbar and sacral areas  Patient tolerated the procedure well with improvement in symptoms  Patient given exercises, stretches and lifestyle modifications  See medications in patient instructions if given  Patient will follow up in 2 weeks

## 2017-05-08 NOTE — Patient Instructions (Addendum)
Good to see you  Daniel [name] is your friend.  2 injection  today  Lets see you again in 2 weeks (ok to double book if needed) Easier said then done but find time for yourself.  Happy holidays!

## 2017-05-08 NOTE — Assessment & Plan Note (Signed)
Worsening tightness of the lumbar spine.  Patient does have known degenerative disc disease of the neck as well.  We discussed icing regimen, home exercises and trying to find time for himself.  Patient declined any new medications at this time.  Encouraged him to take the gabapentin on a more regular basis.  Follow-up with me again in 2 weeks.

## 2017-05-21 NOTE — Progress Notes (Signed)
[name] [place], [place] [address] Phone: [phone] Subjective:     CC: Neck and back pain  [name]  Ajai Harville is a 66 y.o. male coming in with complaint of neck and back pain.  Had does not have known degenerative disc disease.  Has done very well with doing the home exercises, has muscle relaxers for breakthrough pain, but seems to have done better with vitamin D supplementation as well as sometimes taking hydroxyzine.  Patient states that he has had some improvement since last visit but is still in pain.     Past Medical History:  Diagnosis Date  . Acne   . Anxiety   . DDD (degenerative disc disease), lumbosacral    chronic back pain  . Diverticulosis of colon (without mention of hemorrhage)    colo 09/2010  . Low back pain radiating to both legs    Past Surgical History:  Procedure Laterality Date  . CATARACT EXTRACTION Left 07/14/2015  . CYST EXCISION Left 06/23/2015   Procedure: EXCISION MASS LEFT INDEX FINGER;  Surgeon: Daryll Brod, MD;  Location: [facility];  Service: Orthopedics;  Laterality: Left;  ANESTHESIA: IV REGIONAL/FAB  . HERNIA REPAIR    . REFRACTIVE SURGERY     left eye/ with enhancement  . TONSILLECTOMY     Social History   Socioeconomic History  . Marital status: Single    Spouse name: Not on file  . Number of children: Not on file  . Years of education: Not on file  . Highest education level: Not on file  Social Needs  . Financial resource strain: Not on file  . Food insecurity - worry: Not on file  . Food insecurity - inability: Not on file  . Transportation needs - medical: Not on file  . Transportation needs - non-medical: Not on file  Occupational History  . Not on file  Tobacco Use  . Smoking status: Never Smoker  . Smokeless tobacco: Never Used  Substance and Sexual Activity  . Alcohol use: Yes    Comment: weekly  . Drug use: No  . Sexual activity: Not on file    Other Topics Concern  . Not on file  Social History Narrative   Caffienated beverages-Yes   Seat belts often-Yes   Smoke alarm in home-Yes   Firearms/guns in home-Yes   History of physical abuse-NO   HLE- Masters degree   Allergies  Allergen Reactions  . Durezol [Difluprednate]     Eye drops-swelling   Family History  Problem Relation Age of Onset  . Diabetes Mother   . Hypertension Mother   . Arthritis Mother   . Arthritis Father   . Heart disease Father   . Diabetes Sister   . Cancer Neg Hx   . Stroke Neg Hx   . Kidney disease Neg Hx   . Early death Neg Hx      Past medical history, social, surgical and family history all reviewed in electronic medical record.  No pertanent information unless stated regarding to the chief complaint.   Review of Systems:Review of systems updated and as accurate as of 05/21/17  No headache, visual changes, nausea, vomiting, diarrhea, constipation, dizziness, abdominal pain, skin rash, fevers, chills, night sweats, weight loss, swollen lymph nodes, body aches, joint swelling,  chest pain, shortness of breath, mood changes.  Positive muscle aches  Objective  There were no vitals taken for this visit. Systems examined below as of  05/21/17   General: No apparent distress alert and oriented x3 mood and affect normal, dressed appropriately.  HEENT: Pupils equal, extraocular movements intact  Respiratory: Patient's speak in full sentences and does not appear short of breath  Cardiovascular: No lower extremity edema, non tender, no erythema  Skin: Warm dry intact with no signs of infection or rash on extremities or on axial skeleton.  Abdomen: Soft nontender  Neuro: Cranial nerves II through XII are intact, neurovascularly intact in all extremities with 2+ DTRs and 2+ pulses.  Lymph: No lymphadenopathy of posterior or anterior cervical chain or axillae bilaterally.  Gait normal with good balance and coordination.  MSK: Mild tender with full  range of motion and good stability and symmetric strength and tone of shoulders, elbows, wrist, hip, knee and ankles bilaterally.    Osteopathic findings C2 flexed rotated and side bent right C4 flexed rotated and side bent left C7 flexed rotated and side bent left T3 extended rotated and side bent right inhaled third rib T6 extended rotated and side bent left L3 flexed rotated and side bent right Sacrum right on right     Impression and Recommendations:     This case required medical decision making of moderate complexity.      Note: This dictation was prepared with Dragon dictation along with smaller phrase technology. Any transcriptional errors that result from this process are unintentional.

## 2017-05-22 ENCOUNTER — Encounter: Payer: Self-pay | Admitting: Family Medicine

## 2017-05-22 ENCOUNTER — Other Ambulatory Visit: Payer: Self-pay

## 2017-05-22 ENCOUNTER — Ambulatory Visit (INDEPENDENT_AMBULATORY_CARE_PROVIDER_SITE_OTHER): Payer: BLUE CROSS/BLUE SHIELD | Admitting: Family Medicine

## 2017-05-22 VITALS — BP 132/90 | HR 63 | Wt 217.0 lb

## 2017-05-22 DIAGNOSIS — M5137 Other intervertebral disc degeneration, lumbosacral region: Secondary | ICD-10-CM | POA: Diagnosis not present

## 2017-05-22 DIAGNOSIS — M999 Biomechanical lesion, unspecified: Secondary | ICD-10-CM | POA: Diagnosis not present

## 2017-05-22 MED ORDER — ALLOPURINOL 100 MG PO TABS: 200.0000 mg | ORAL_TABLET | Freq: Every day | ORAL | 1 refills | Status: DC

## 2017-05-22 MED ORDER — HYDROXYZINE HCL 10 MG PO TABS: 10.0000 mg | ORAL_TABLET | Freq: Three times a day (TID) | ORAL | 1 refills | Status: DC | PRN

## 2017-05-22 NOTE — Patient Instructions (Signed)
Happy New Year!  Ice 20 minutes 2 times daily. Usually after activity and before bed. Exercises 3 times a week.  Find time for yourself.  See me again in 4 weeks

## 2017-05-22 NOTE — Assessment & Plan Note (Signed)
Stable overall.  Discussed with patient in great length.  Discussed icing regimen and home exercises. Patient will continue to work on posture and ergonomics.  No significant change in medications and refilled as patient instructions.  Follow-up again in 4-6 weeks

## 2017-05-22 NOTE — Assessment & Plan Note (Signed)
Decision today to treat with OMT was based on Physical Exam  After verbal consent patient was treated with HVLA, ME, FPR techniques in cervical, thoracic, rib, lumbar and sacral areas  Patient tolerated the procedure well with improvement in symptoms  Patient given exercises, stretches and lifestyle modifications  See medications in patient instructions if given  Patient will follow up in 4-6 weeks 

## 2017-06-13 ENCOUNTER — Other Ambulatory Visit: Payer: Self-pay | Admitting: Neurology

## 2017-06-25 NOTE — Progress Notes (Signed)
[name] [place], [place] [address] Phone: [phone] Subjective:    I'm seeing this patient by the request  of:    CC: Back pain follow-up  [id number]  Daniel [name] is a 67 y.o. male coming in with complaint of neck pain.  Was having some right greater trochanteric bursitis symptomatology as well.  Responded very well to osteopathic manipulation.  Seen 1 month ago.  Patient states that he has been doing well since visit. No new issues and is here for OMT to work on the neck and lower back.  Patient has some increasing stress recently.  Notices that that seems to make it worse from time to time.    Past Medical History:  Diagnosis Date  . Acne   . Anxiety   . DDD (degenerative disc disease), lumbosacral    chronic back pain  . Diverticulosis of colon (without mention of hemorrhage)    colo 09/2010  . Low back pain radiating to both legs    Past Surgical History:  Procedure Laterality Date  . CATARACT EXTRACTION Left 07/14/2015  . CYST EXCISION Left 06/23/2015   Procedure: EXCISION MASS LEFT INDEX FINGER;  Surgeon: Daryll Brod, MD;  Location: [facility];  Service: Orthopedics;  Laterality: Left;  ANESTHESIA: IV REGIONAL/FAB  . HERNIA REPAIR    . REFRACTIVE SURGERY     left eye/ with enhancement  . TONSILLECTOMY     Social History   Socioeconomic History  . Marital status: Single    Spouse name: None  . Number of children: None  . Years of education: None  . Highest education level: None  Social Needs  . Financial resource strain: None  . Food insecurity - worry: None  . Food insecurity - inability: None  . Transportation needs - medical: None  . Transportation needs - non-medical: None  Occupational History  . None  Tobacco Use  . Smoking status: Never Smoker  . Smokeless tobacco: Never Used  Substance and Sexual Activity  . Alcohol use: Yes    Comment: weekly  . Drug use: No  . Sexual activity:  None  Other Topics Concern  . None  Social History Narrative   Caffienated beverages-Yes   Seat belts often-Yes   Smoke alarm in home-Yes   Firearms/guns in home-Yes   History of physical abuse-NO   HLE- Masters degree   Allergies  Allergen Reactions  . Durezol [Difluprednate]     Eye drops-swelling   Family History  Problem Relation Age of Onset  . Diabetes Mother   . Hypertension Mother   . Arthritis Mother   . Arthritis Father   . Heart disease Father   . Diabetes Sister   . Cancer Neg Hx   . Stroke Neg Hx   . Kidney disease Neg Hx   . Early death Neg Hx      Past medical history, social, surgical and family history all reviewed in electronic medical record.  No pertanent information unless stated regarding to the chief complaint.   Review of Systems:Review of systems updated and as accurate as of 06/26/17  No headache, visual changes, nausea, vomiting, diarrhea, constipation, dizziness, abdominal pain, skin rash, fevers, chills, night sweats, weight loss, swollen lymph nodes, body aches, joint swelling, muscle aches, chest pain, shortness of breath, mood changes.   Objective  Blood pressure 110/66, pulse (!) 57, weight 219 lb (99.3 kg), SpO2 97 %. Systems examined below as of 06/26/17  General: No apparent distress alert and oriented x3 mood and affect normal, dressed appropriately.  HEENT: Pupils equal, extraocular movements intact  Respiratory: Patient's speak in full sentences and does not appear short of breath  Cardiovascular: No lower extremity edema, non tender, no erythema  Skin: Warm dry intact with no signs of infection or rash on extremities or on axial skeleton.  Abdomen: Soft nontender  Neuro: Cranial nerves II through XII are intact, neurovascularly intact in all extremities with 2+ DTRs and 2+ pulses.  Lymph: No lymphadenopathy of posterior or anterior cervical chain or axillae bilaterally.  Gait normal with good balance and coordination.  MSK:   Non tender with full range of motion and good stability and symmetric strength and tone of shoulders, elbows, wrist, hip, knee and ankles bilaterally.  Back Exam:  Inspection: Mild loss of lordosis more than usual Motion: Flexion 35 deg, Extension 25 deg, Side Bending to 25 deg bilaterally,  Rotation to 25 deg bilaterally  SLR laying: Negative  XSLR laying: Negative  Palpable tenderness: Tender to palpation in the paraspinal musculature lumbar spine right greater than left.Marland Kitchen FABER: Tightness bilaterally. Sensory change: Gross sensation intact to all lumbar and sacral dermatomes.  Reflexes: 2+ at both patellar tendons, 2+ at achilles tendons, Babinski's downgoing.  Strength at foot  Plantar-flexion: 5/5 Dorsi-flexion: 5/5 Eversion: 5/5 Inversion: 5/5  Leg strength  Quad: 5/5 Hamstring: 5/5 Hip flexor: 5/5 Hip abductors: 4/5 but symmetric Gait unremarkable.  Osteopathic findings C2 flexed rotated and side bent right C4 flexed rotated and side bent left C7 flexed rotated and side bent left T3 extended rotated and side bent right inhaled third rib T6 extended rotated and side bent left L2 flexed rotated and side bent right Sacrum right on right     Impression and Recommendations:     This case required medical decision making of moderate complexity.      Note: This dictation was prepared with Dragon dictation along with smaller phrase technology. Any transcriptional errors that result from this process are unintentional.

## 2017-06-26 ENCOUNTER — Encounter: Payer: Self-pay | Admitting: Family Medicine

## 2017-06-26 ENCOUNTER — Ambulatory Visit: Payer: BLUE CROSS/BLUE SHIELD | Admitting: Family Medicine

## 2017-06-26 VITALS — BP 110/66 | HR 57 | Wt 219.0 lb

## 2017-06-26 DIAGNOSIS — M999 Biomechanical lesion, unspecified: Secondary | ICD-10-CM | POA: Diagnosis not present

## 2017-06-26 DIAGNOSIS — M5137 Other intervertebral disc degeneration, lumbosacral region: Secondary | ICD-10-CM | POA: Diagnosis not present

## 2017-06-26 NOTE — Assessment & Plan Note (Signed)
Stable.  No significant changes in management.  Encourage weight loss.  Patient was told about [facility].  Patient has elected to try to make changes on his own.  Worsening some doing which wants to avoid.  Patient will come back and see me again in 4 weeks

## 2017-06-26 NOTE — Patient Instructions (Signed)
Good to see you  Daniel [name] is your friend.  Stay active.  Duexis 3 times a day for 3 days See me again in 4 weeks

## 2017-06-26 NOTE — Assessment & Plan Note (Signed)
Decision today to treat with OMT was based on Physical Exam  After verbal consent patient was treated with HVLA, ME, FPR techniques in cervical, thoracic, lumbar and sacral areas  Patient tolerated the procedure well with improvement in symptoms  Patient given exercises, stretches and lifestyle modifications  See medications in patient instructions if given  Patient will follow up in 4 weeks 

## 2017-07-03 ENCOUNTER — Ambulatory Visit: Payer: BLUE CROSS/BLUE SHIELD | Admitting: Neurology

## 2017-07-23 NOTE — Progress Notes (Signed)
[name] [place], [place] [address] Phone: [phone] Subjective:    I'm seeing this patient by the request  of:    CC: Back and neck pain follow-up  [id number]  Daniel [name] is a 67 y.o. male coming in with complaint of back and neck pain.  Known to have degenerative disc disease of both.  No radicular symptoms at this time.  Discussed icing regimen and home exercises.  Been doing them fairly regularly.  Patient has not been having as much time for himself though.  Has had difficulty with his knees with some synovitis as this as well as some underlying arthritis.  Patient feels that the right knee has been giving him some more trouble.  Not severe enough for an injection low.     Past Medical History:  Diagnosis Date  . Acne   . Anxiety   . DDD (degenerative disc disease), lumbosacral    chronic back pain  . Diverticulosis of colon (without mention of hemorrhage)    colo 09/2010  . Low back pain radiating to both legs    Past Surgical History:  Procedure Laterality Date  . CATARACT EXTRACTION Left 07/14/2015  . CYST EXCISION Left 06/23/2015   Procedure: EXCISION MASS LEFT INDEX FINGER;  Surgeon: Daryll Brod, MD;  Location: [facility];  Service: Orthopedics;  Laterality: Left;  ANESTHESIA: IV REGIONAL/FAB  . HERNIA REPAIR    . REFRACTIVE SURGERY     left eye/ with enhancement  . TONSILLECTOMY     Social History   Socioeconomic History  . Marital status: Single    Spouse name: None  . Number of children: None  . Years of education: None  . Highest education level: None  Social Needs  . Financial resource strain: None  . Food insecurity - worry: None  . Food insecurity - inability: None  . Transportation needs - medical: None  . Transportation needs - non-medical: None  Occupational History  . None  Tobacco Use  . Smoking status: Never Smoker  . Smokeless tobacco: Never Used  Substance and Sexual  Activity  . Alcohol use: Yes    Comment: weekly  . Drug use: No  . Sexual activity: None  Other Topics Concern  . None  Social History Narrative   Caffienated beverages-Yes   Seat belts often-Yes   Smoke alarm in home-Yes   Firearms/guns in home-Yes   History of physical abuse-NO   HLE- Masters degree   Allergies  Allergen Reactions  . Durezol [Difluprednate]     Eye drops-swelling   Family History  Problem Relation Age of Onset  . Diabetes Mother   . Hypertension Mother   . Arthritis Mother   . Arthritis Father   . Heart disease Father   . Diabetes Sister   . Cancer Neg Hx   . Stroke Neg Hx   . Kidney disease Neg Hx   . Early death Neg Hx      Past medical history, social, surgical and family history all reviewed in electronic medical record.  No pertanent information unless stated regarding to the chief complaint.   Review of Systems:Review of systems updated and as accurate as of 07/24/17  No headache, visual changes, nausea, vomiting, diarrhea, constipation, dizziness, abdominal pain, skin rash, fevers, chills, night sweats, weight loss, swollen lymph nodes, body aches, joint swelling,  chest pain, shortness of breath, mood changes.  Positive muscle aches  Objective  Blood pressure 130/90,  pulse 60, height 5\' 9"  (1.753 m), weight 220 lb (99.8 kg), SpO2 96 %. Systems examined below as of 07/24/17   General: No apparent distress alert and oriented x3 mood and affect normal, dressed appropriately.  HEENT: Pupils equal, extraocular movements intact  Respiratory: Patient's speak in full sentences and does not appear short of breath  Cardiovascular: No lower extremity edema, non tender, no erythema  Skin: Warm dry intact with no signs of infection or rash on extremities or on axial skeleton.  Abdomen: Soft nontender  Neuro: Cranial nerves II through XII are intact, neurovascularly intact in all extremities with 2+ DTRs and 2+ pulses.  Lymph: No lymphadenopathy of  posterior or anterior cervical chain or axillae bilaterally.  Gait normal with good balance and coordination.  MSK:  Non tender with full range of motion and good stability and symmetric strength and tone of shoulders, elbows, wrist, hip, knee and ankles bilaterally.  Neck: Inspection loss of lordosis. No palpable stepoffs. Negative Spurling's maneuver. Mild limitation lacking the last 5-10 degrees extension. Grip strength and sensation normal in bilateral hands Strength good C4 to T1 distribution No sensory change to C4 to T1 Negative Hoffman sign bilaterally Reflexes normal Tightness on the right side of the trapezius  Lower back shows significant tightness in all planes of the lumbar spine.  Mild loss of lordosis.  Positive Faber test bilaterally.  Negative straight leg test.  Osteopathic findings C5 flexed rotated and side bent left T3 extended rotated and side bent right inhaled third rib T6 extended rotated and side bent left L2 flexed rotated and side bent right Sacrum right on right      Impression and Recommendations:     This case required medical decision making of moderate complexity.      Note: This dictation was prepared with Dragon dictation along with smaller phrase technology. Any transcriptional errors that result from this process are unintentional.

## 2017-07-24 ENCOUNTER — Encounter: Payer: Self-pay | Admitting: Family Medicine

## 2017-07-24 ENCOUNTER — Ambulatory Visit: Payer: BLUE CROSS/BLUE SHIELD | Admitting: Family Medicine

## 2017-07-24 VITALS — BP 130/90 | HR 60 | Ht 69.0 in | Wt 220.0 lb

## 2017-07-24 DIAGNOSIS — S83206D Unspecified tear of unspecified meniscus, current injury, right knee, subsequent encounter: Secondary | ICD-10-CM | POA: Diagnosis not present

## 2017-07-24 DIAGNOSIS — M999 Biomechanical lesion, unspecified: Secondary | ICD-10-CM | POA: Diagnosis not present

## 2017-07-24 DIAGNOSIS — M5030 Other cervical disc degeneration, unspecified cervical region: Secondary | ICD-10-CM | POA: Diagnosis not present

## 2017-07-24 NOTE — Patient Instructions (Signed)
Great to see you as usual.  Try the brace when doing a lot of activity.  Ice 20 minutes 2 times daily. Usually after activity and before bed. 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  See me again in 4-5 weeks

## 2017-07-24 NOTE — Assessment & Plan Note (Signed)
Patient has done relatively well.  Given another brace.  More for the patellofemoral aspect.

## 2017-07-24 NOTE — Assessment & Plan Note (Signed)
Decision today to treat with OMT was based on Physical Exam  After verbal consent patient was treated with HVLA, ME, FPR techniques in cervical, thoracic, rib, lumbar and sacral areas  Patient tolerated the procedure well with improvement in symptoms  Patient given exercises, stretches and lifestyle modifications  See medications in patient instructions if given  Patient will follow up in 4-6 weeks 

## 2017-07-24 NOTE — Assessment & Plan Note (Signed)
Stable at the moment.  Continues to respond well to osteopathic manipulation.  We discussed icing regimen and home exercises.  Discussed posture and ergonomics as well as lifting mechanics.  Follow-up again in 4-6 weeks

## 2017-08-22 ENCOUNTER — Ambulatory Visit: Payer: BLUE CROSS/BLUE SHIELD | Admitting: Family Medicine

## 2017-09-04 NOTE — Progress Notes (Signed)
[name] [place], [place] [address] Phone: [phone] Subjective:     CC: Back and neck pain  [id number]  Daniel [name] is a 67 y.o. male coming in with complaint of back and neck pain.  Has been seen multiple times.  Known arthritic changes.  Patient's has noticed some increasing stiffness and tightness recently.  Continued care of his mother who is a kneeling and going to be in and out of the hospital here.  No new symptoms just worsening than previous symptoms.    Past Medical History:  Diagnosis Date  . Acne   . Anxiety   . DDD (degenerative disc disease), lumbosacral    chronic back pain  . Diverticulosis of colon (without mention of hemorrhage)    colo 09/2010  . Low back pain radiating to both legs    Past Surgical History:  Procedure Laterality Date  . CATARACT EXTRACTION Left 07/14/2015  . CYST EXCISION Left 06/23/2015   Procedure: EXCISION MASS LEFT INDEX FINGER;  Surgeon: Daryll Brod, MD;  Location: [facility];  Service: Orthopedics;  Laterality: Left;  ANESTHESIA: IV REGIONAL/FAB  . HERNIA REPAIR    . REFRACTIVE SURGERY     left eye/ with enhancement  . TONSILLECTOMY     Social History   Socioeconomic History  . Marital status: Single    Spouse name: Not on file  . Number of children: Not on file  . Years of education: Not on file  . Highest education level: Not on file  Occupational History  . Not on file  Social Needs  . Financial resource strain: Not on file  . Food insecurity:    Worry: Not on file    Inability: Not on file  . Transportation needs:    Medical: Not on file    Non-medical: Not on file  Tobacco Use  . Smoking status: Never Smoker  . Smokeless tobacco: Never Used  Substance and Sexual Activity  . Alcohol use: Yes    Comment: weekly  . Drug use: No  . Sexual activity: Not on file  Lifestyle  . Physical activity:    Days per week: Not on file    Minutes per session:  Not on file  . Stress: Not on file  Relationships  . Social connections:    Talks on phone: Not on file    Gets together: Not on file    Attends religious service: Not on file    Active member of club or organization: Not on file    Attends meetings of clubs or organizations: Not on file    Relationship status: Not on file  Other Topics Concern  . Not on file  Social History Narrative   Caffienated beverages-Yes   Seat belts often-Yes   Smoke alarm in home-Yes   Firearms/guns in home-Yes   History of physical abuse-NO   HLE- Masters degree   Allergies  Allergen Reactions  . Durezol [Difluprednate]     Eye drops-swelling   Family History  Problem Relation Age of Onset  . Diabetes Mother   . Hypertension Mother   . Arthritis Mother   . Arthritis Father   . Heart disease Father   . Diabetes Sister   . Cancer Neg Hx   . Stroke Neg Hx   . Kidney disease Neg Hx   . Early death Neg Hx      Past medical history, social, surgical and family history all reviewed  in electronic medical record.  No pertanent information unless stated regarding to the chief complaint.   Review of Systems:Review of systems updated and as accurate as of 09/05/17  No headache, visual changes, nausea, vomiting, diarrhea, constipation, dizziness, abdominal pain, skin rash, fevers, chills, night sweats, weight loss, swollen lymph nodes, body aches, joint swelling, chest pain, shortness of breath, mood changes.  Positive muscle aches  Objective  Blood pressure 118/82, pulse (!) 54, weight 216 lb (98 kg), SpO2 97 %. Systems examined below as of 09/05/17   General: No apparent distress alert and oriented x3 mood and affect normal, dressed appropriately.  HEENT: Pupils equal, extraocular movements intact  Respiratory: Patient's speak in full sentences and does not appear short of breath  Cardiovascular: No lower extremity edema, non tender, no erythema  Skin: Warm dry intact with no signs of infection or  rash on extremities or on axial skeleton.  Abdomen: Soft nontender  Neuro: Cranial nerves II through XII are intact, neurovascularly intact in all extremities with 2+ DTRs and 2+ pulses.  Lymph: No lymphadenopathy of posterior or anterior cervical chain or axillae bilaterally.  Gait normal with good balance and coordination.  MSK:  Non tender with full range of motion and good stability and symmetric strength and tone of shoulders, elbows, wrist, hip, knee and ankles bilaterally.  Neck: Inspection loss of lordosis. No palpable stepoffs. Negative Spurling's maneuver. Mild limitation in all planes by 5-10 degrees Grip strength and sensation normal in bilateral hands Strength good C4 to T1 distribution No sensory change to C4 to T1 Negative Hoffman sign bilaterally Reflexes normal Tightness of the trapezius bilaterally.  Back shows significant tightness and loss of range of motion in all planes.  Patient has negative straight leg test but tightness in the hamstrings bilaterally.  Tightness with Corky Sox test bilaterally.  Tenderness to palpation diffusely in the paraspinal musculature in the thoracic and lumbar spine.  Osteopathic findings C2 flexed rotated and side bent right C4 flexed rotated and side bent left C6 flexed rotated and side bent left T3 extended rotated and side bent right inhaled third rib T9 extended rotated and side bent left L3 flexed rotated and side bent right Sacrum right on right     Impression and Recommendations:     This case required medical decision making of moderate complexity.      Note: This dictation was prepared with Dragon dictation along with smaller phrase technology. Any transcriptional errors that result from this process are unintentional.

## 2017-09-05 ENCOUNTER — Encounter: Payer: Self-pay | Admitting: Family Medicine

## 2017-09-05 ENCOUNTER — Ambulatory Visit: Payer: BLUE CROSS/BLUE SHIELD | Admitting: Family Medicine

## 2017-09-05 VITALS — BP 118/82 | HR 54 | Wt 216.0 lb

## 2017-09-05 DIAGNOSIS — M999 Biomechanical lesion, unspecified: Secondary | ICD-10-CM

## 2017-09-05 DIAGNOSIS — M5030 Other cervical disc degeneration, unspecified cervical region: Secondary | ICD-10-CM | POA: Diagnosis not present

## 2017-09-05 NOTE — Assessment & Plan Note (Signed)
Decision today to treat with OMT was based on Physical Exam  After verbal consent patient was treated with HVLA, ME, FPR techniques in cervical, thoracic, lumbar and sacral areas  Patient tolerated the procedure well with improvement in symptoms  Patient given exercises, stretches and lifestyle modifications  See medications in patient instructions if given  Patient will follow up in 4 weeks 

## 2017-09-05 NOTE — Assessment & Plan Note (Signed)
Tightness.  Discussed icing regimen and posture and ergonomics.  Discussed which activities to do which wants to avoid.  Patient was increasing which activities to do which wants to avoid.  Patient encouraged to try to find more time for himself.  We discussed about anxiety and depression.  Follow-up again in 4 weeks

## 2017-09-05 NOTE — Patient Instructions (Signed)
Good to see you  Daniel [name] is your friend.  Duexis 3 times a day for 3 days If not better on Thursday call and will send in prednisone  See me again in 2-4 weeks

## 2017-10-02 NOTE — Progress Notes (Signed)
[name] [name] [place], [place] [address] Phone: [phone] Subjective:     CC: Back pain  STM:[id number]  Daniel [name] is a 67 y.o. male coming in with complaint of back pain. His pain continues but he has good days and bad days.  Patient has been seen multiple times.  We have responded very well to osteopathic manipulation.  Has been having increasing stress level secondary to caring for his parents as well as having difficulty with some of his siblings.  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  Lots of stress.     Past Medical History:  Diagnosis Date  . Acne   . Anxiety   . DDD (degenerative disc disease), lumbosacral    chronic back pain  . Diverticulosis of colon (without mention of hemorrhage)    colo 09/2010  . Low back pain radiating to both legs    Past Surgical History:  Procedure Laterality Date  . CATARACT EXTRACTION Left 07/14/2015  . CYST EXCISION Left 06/23/2015   Procedure: EXCISION MASS LEFT INDEX FINGER;  Surgeon: Daryll Brod, MD;  Location: [facility];  Service: Orthopedics;  Laterality: Left;  ANESTHESIA: IV REGIONAL/FAB  . HERNIA REPAIR    . REFRACTIVE SURGERY     left eye/ with enhancement  . TONSILLECTOMY     Social History   Socioeconomic History  . Marital status: Single    Spouse name: Not on file  . Number of children: Not on file  . Years of education: Not on file  . Highest education level: Not on file  Occupational History  . Not on file  Social Needs  . Financial resource strain: Not on file  . Food insecurity:    Worry: Not on file    Inability: Not on file  . Transportation needs:    Medical: Not on file    Non-medical: Not on file  Tobacco Use  . Smoking status: Never Smoker  . Smokeless tobacco: Never Used  Substance and Sexual Activity  . Alcohol use: Yes    Comment: weekly  . Drug use: No  . Sexual activity:  Not on file  Lifestyle  . Physical activity:    Days per week: Not on file    Minutes per session: Not on file  . Stress: Not on file  Relationships  . Social connections:    Talks on phone: Not on file    Gets together: Not on file    Attends religious service: Not on file    Active member of club or organization: Not on file    Attends meetings of clubs or organizations: Not on file    Relationship status: Not on file  Other Topics Concern  . Not on file  Social History Narrative   Caffienated beverages-Yes   Seat belts often-Yes   Smoke alarm in home-Yes   Firearms/guns in home-Yes   History of physical abuse-NO   HLE- Masters degree   Allergies  Allergen Reactions  . Durezol [Difluprednate]     Eye drops-swelling   Family History  Problem Relation Age of Onset  . Diabetes Mother   . Hypertension Mother   . Arthritis Mother   . Arthritis Father   . Heart disease Father   . Diabetes Sister   . Cancer Neg Hx   . Stroke Neg Hx   . Kidney disease Neg Hx   .  Early death Neg Hx      Past medical history, social, surgical and family history all reviewed in electronic medical record.  No pertanent information unless stated regarding to the chief complaint.   Review of Systems:Review of systems updated and as accurate as of 10/03/17  No headache, visual changes, nausea, vomiting, diarrhea, constipation, dizziness, abdominal pain, skin rash, fevers, chills, night sweats, weight loss, swollen lymph nodes, body aches, joint swelling, muscle aches, chest pain, shortness of breath, mood changes.   Objective  Blood pressure 132/82, pulse 71, height 5\' 9"  (1.753 m), weight 218 lb (98.9 kg), SpO2 98 %. Systems examined below as of 10/03/17   General: No apparent distress alert and oriented x3 mood and affect normal, dressed appropriately.  HEENT: Pupils equal, extraocular movements intact  Respiratory: Patient's speak in full sentences and does not appear short of breath    Cardiovascular: No lower extremity edema, non tender, no erythema  Skin: Warm dry intact with no signs of infection or rash on extremities or on axial skeleton.  Abdomen: Soft nontender  Neuro: Cranial nerves II through XII are intact, neurovascularly intact in all extremities with 2+ DTRs and 2+ pulses.  Lymph: No lymphadenopathy of posterior or anterior cervical chain or axillae bilaterally.  Gait normal with good balance and coordination.  MSK:  Non tender with full range of motion and good stability and symmetric strength and tone of shoulders, elbows, wrist, hip, knee and ankles bilaterally.  Back Exam:  Inspection: Loss of lordosis worsening core strength Motion: Flexion 45 deg, Extension 20 deg, Side Bending to 35 deg bilaterally,  Rotation to 45 deg bilaterally  SLR laying: Negative  XSLR laying: Negative  Palpable tenderness: Tender to palpation of the thoracolumbar as well as the lumbosacral areas bilaterally. FABER: Positive bilaterally. Sensory change: Gross sensation intact to all lumbar and sacral dermatomes.  Reflexes: 2+ at both patellar tendons, 2+ at achilles tendons, Babinski's downgoing.  Strength at foot  Plantar-flexion: 5/5 Dorsi-flexion: 5/5 Eversion: 5/5 Inversion: 5/5  Leg strength  Quad: 5/5 Hamstring: 5/5 Hip flexor: 5/5 Hip abductors: 5/5  Gait unremarkable.  Osteopathic findings C2 flexed rotated and side bent right C4 flexed rotated and side bent left C7 flexed rotated and side bent left T3 extended rotated and side bent right inhaled third rib T6 extended rotated and side bent left L2 flexed rotated and side bent right Sacrum right on right     Impression and Recommendations:     This case required medical decision making of moderate complexity.      Note: This dictation was prepared with Dragon dictation along with smaller phrase technology. Any transcriptional errors that result from this process are unintentional.

## 2017-10-03 ENCOUNTER — Ambulatory Visit: Payer: BLUE CROSS/BLUE SHIELD | Admitting: Family Medicine

## 2017-10-03 ENCOUNTER — Encounter: Payer: Self-pay | Admitting: Family Medicine

## 2017-10-03 VITALS — BP 132/82 | HR 71 | Ht 69.0 in | Wt 218.0 lb

## 2017-10-03 DIAGNOSIS — M999 Biomechanical lesion, unspecified: Secondary | ICD-10-CM

## 2017-10-03 DIAGNOSIS — M5137 Other intervertebral disc degeneration, lumbosacral region: Secondary | ICD-10-CM | POA: Diagnosis not present

## 2017-10-03 NOTE — Patient Instructions (Addendum)
Good to see you  Sorry for the delay  Try to find time for yourself.  See me again in 4 weeks

## 2017-10-03 NOTE — Assessment & Plan Note (Signed)
Decision today to treat with OMT was based on Physical Exam  After verbal consent patient was treated with HVLA, ME, FPR techniques in cervical, thoracic, rib, lumbar and sacral areas  Patient tolerated the procedure well with improvement in symptoms  Patient given exercises, stretches and lifestyle modifications  See medications in patient instructions if given  Patient will follow up in 4-6 weeks 

## 2017-10-03 NOTE — Assessment & Plan Note (Signed)
More tightness noted today.  Do think that there is some increasing stress.  Has had tightness of the neck as well.  Discussed home exercises, patient is not taking any true medications for anxiety at this moment.  We discussed this at great length again which patient has declined.  Patient will follow up with me again in 4 to 6 weeks

## 2017-10-09 ENCOUNTER — Other Ambulatory Visit: Payer: Self-pay | Admitting: Neurology

## 2017-10-30 NOTE — Progress Notes (Signed)
[name] [place] [place], [place] [address] Phone: [phone] Subjective:     CC: Neck and neck pain follow-up  UVO:[id number]  Daniel [name] is a 67 y.o. male coming in with complaint of  Back and neck pain.  Discussed with patient in great length.  Discussed icing regimen and home exercises.  Discussed which activities to do which was to avoid patient has noted some tightness recently.  Some increasing stress with the illnesses of his parents recently.  Also been moving furniture more regularly.    Past Medical History:  Diagnosis Date  . Acne   . Anxiety   . DDD (degenerative disc disease), lumbosacral    chronic back pain  . Diverticulosis of colon (without mention of hemorrhage)    colo 09/2010  . Low back pain radiating to both legs    Past Surgical History:  Procedure Laterality Date  . CATARACT EXTRACTION Left 07/14/2015  . CYST EXCISION Left 06/23/2015   Procedure: EXCISION MASS LEFT INDEX FINGER;  Surgeon: Daryll Brod, MD;  Location: [facility];  Service: Orthopedics;  Laterality: Left;  ANESTHESIA: IV REGIONAL/FAB  . HERNIA REPAIR    . REFRACTIVE SURGERY     left eye/ with enhancement  . TONSILLECTOMY     Social History   Socioeconomic History  . Marital status: Single    Spouse name: Not on file  . Number of children: Not on file  . Years of education: Not on file  . Highest education level: Not on file  Occupational History  . Not on file  Social Needs  . Financial resource strain: Not on file  . Food insecurity:    Worry: Not on file    Inability: Not on file  . Transportation needs:    Medical: Not on file    Non-medical: Not on file  Tobacco Use  . Smoking status: Never Smoker  . Smokeless tobacco: Never Used  Substance and Sexual Activity  . Alcohol use: Yes    Comment: weekly  . Drug use: No  . Sexual activity: Not on file  Lifestyle  . Physical activity:    Days per week: Not on file   Minutes per session: Not on file  . Stress: Not on file  Relationships  . Social connections:    Talks on phone: Not on file    Gets together: Not on file    Attends religious service: Not on file    Active member of club or organization: Not on file    Attends meetings of clubs or organizations: Not on file    Relationship status: Not on file  Other Topics Concern  . Not on file  Social History Narrative   Caffienated beverages-Yes   Seat belts often-Yes   Smoke alarm in home-Yes   Firearms/guns in home-Yes   History of physical abuse-NO   HLE- Masters degree   Allergies  Allergen Reactions  . Durezol [Difluprednate]     Eye drops-swelling   Family History  Problem Relation Age of Onset  . Diabetes Mother   . Hypertension Mother   . Arthritis Mother   . Arthritis Father   . Heart disease Father   . Diabetes Sister   . Cancer Neg Hx   . Stroke Neg Hx   . Kidney disease Neg Hx   . Early death Neg Hx      Past medical history, social, surgical and family history all reviewed in electronic  medical record.  No pertanent information unless stated regarding to the chief complaint.   Review of Systems:Review of systems updated and as accurate as of 10/31/17  No headache, visual changes, nausea, vomiting, diarrhea, constipation, dizziness, abdominal pain, skin rash, fevers, chills, night sweats, weight loss, swollen lymph nodes, body aches, joint swelling, chest pain, shortness of breath, mood changes.  Positive muscle aches  Objective  Blood pressure 140/84, pulse 60, height 5\' 9"  (1.753 m), weight 218 lb (98.9 kg), SpO2 97 %. Systems examined below as of 10/31/17   General: No apparent distress alert and oriented x3 mood and affect normal, dressed appropriately.  HEENT: Pupils equal, extraocular movements intact  Respiratory: Patient's speak in full sentences and does not appear short of breath  Cardiovascular: No lower extremity edema, non tender, no erythema  Skin:  Warm dry intact with no signs of infection or rash on extremities or on axial skeleton.  Abdomen: Soft nontender  Neuro: Cranial nerves II through XII are intact, neurovascularly intact in all extremities with 2+ DTRs and 2+ pulses.  Lymph: No lymphadenopathy of posterior or anterior cervical chain or axillae bilaterally.  MSK:  Non tender with full range of motion and good stability and symmetric strength and tone of shoulders, elbows, wrist, hip, knee and ankles bilaterally.  Mild antalgic gait. More stiffness noted of the lower back in the neck.  Full active range of motion of 5 to 10 degrees in all planes.  Negative Spurling's.  Significant tightness in the hamstring bilaterally.   Osteopathic findings C2 flexed rotated and side bent right C4 flexed rotated and side bent left T3 extended rotated and side bent right inhaled third rib L2 flexed rotated and side bent right Sacrum right on right    Impression and Recommendations:     This case required medical decision making of moderate complexity.      Note: This dictation was prepared with Dragon dictation along with smaller phrase technology. Any transcriptional errors that result from this process are unintentional.

## 2017-10-31 ENCOUNTER — Encounter: Payer: Self-pay | Admitting: Family Medicine

## 2017-10-31 ENCOUNTER — Ambulatory Visit: Payer: BLUE CROSS/BLUE SHIELD | Admitting: Family Medicine

## 2017-10-31 VITALS — BP 140/84 | HR 60 | Ht 69.0 in | Wt 218.0 lb

## 2017-10-31 DIAGNOSIS — M999 Biomechanical lesion, unspecified: Secondary | ICD-10-CM | POA: Diagnosis not present

## 2017-10-31 DIAGNOSIS — M5030 Other cervical disc degeneration, unspecified cervical region: Secondary | ICD-10-CM | POA: Diagnosis not present

## 2017-10-31 NOTE — Assessment & Plan Note (Signed)
Decision today to treat with OMT was based on Physical Exam  After verbal consent patient was treated with HVLA, ME, FPR techniques in cervical, thoracic, rib lumbar and sacral areas  Patient tolerated the procedure well with improvement in symptoms  Patient given exercises, stretches and lifestyle modifications  See medications in patient instructions if given  Patient will follow up in 4 weeks 

## 2017-10-31 NOTE — Patient Instructions (Signed)
Good to see you  Daniel [name] is your friend.  Keep moving! Watch lifting mechanics when you can  See me again in 4 weeks

## 2017-10-31 NOTE — Assessment & Plan Note (Signed)
Discussed posture and ergonomics and lifting mechanics.  Topical anti-inflammatories.  Discussed icing regimen and home exercise.  Follow-up again in 4 weeks

## 2017-11-07 ENCOUNTER — Other Ambulatory Visit: Payer: Self-pay | Admitting: Neurology

## 2017-11-17 ENCOUNTER — Other Ambulatory Visit: Payer: Self-pay | Admitting: Family Medicine

## 2017-11-20 NOTE — Telephone Encounter (Signed)
Refill done.  

## 2017-12-03 NOTE — Progress Notes (Signed)
[name] [address] [place], [place] [address] Phone: [phone] Subjective:     CC: Back and neck pain follow-up  CNO:[id number]  Daniel [name] is a 67 y.o. male coming in with complaint of back and neck pain.  Describes the pain as an aching sensation.  Nothing severe.  Not stopping him from any activities.  States that he does have some discomfort on a regular basis.  Patient was doing yard work the other day and felt like he actually is doing relatively well at this time when usually having more pain.     Past Medical History:  Diagnosis Date  . Acne   . Anxiety   . DDD (degenerative disc disease), lumbosacral    chronic back pain  . Diverticulosis of colon (without mention of hemorrhage)    colo 09/2010  . Low back pain radiating to both legs    Past Surgical History:  Procedure Laterality Date  . CATARACT EXTRACTION Left 07/14/2015  . CYST EXCISION Left 06/23/2015   Procedure: EXCISION MASS LEFT INDEX FINGER;  Surgeon: Daryll Brod, MD;  Location: [facility];  Service: Orthopedics;  Laterality: Left;  ANESTHESIA: IV REGIONAL/FAB  . HERNIA REPAIR    . REFRACTIVE SURGERY     left eye/ with enhancement  . TONSILLECTOMY     Social History   Socioeconomic History  . Marital status: Single    Spouse name: Not on file  . Number of children: Not on file  . Years of education: Not on file  . Highest education level: Not on file  Occupational History  . Not on file  Social Needs  . Financial resource strain: Not on file  . Food insecurity:    Worry: Not on file    Inability: Not on file  . Transportation needs:    Medical: Not on file    Non-medical: Not on file  Tobacco Use  . Smoking status: Never Smoker  . Smokeless tobacco: Never Used  Substance and Sexual Activity  . Alcohol use: Yes    Comment: weekly  . Drug use: No  . Sexual activity: Not on file  Lifestyle  . Physical activity:    Days per week: Not on  file    Minutes per session: Not on file  . Stress: Not on file  Relationships  . Social connections:    Talks on phone: Not on file    Gets together: Not on file    Attends religious service: Not on file    Active member of club or organization: Not on file    Attends meetings of clubs or organizations: Not on file    Relationship status: Not on file  Other Topics Concern  . Not on file  Social History Narrative   Caffienated beverages-Yes   Seat belts often-Yes   Smoke alarm in home-Yes   Firearms/guns in home-Yes   History of physical abuse-NO   HLE- Masters degree   Allergies  Allergen Reactions  . Durezol [Difluprednate]     Eye drops-swelling   Family History  Problem Relation Age of Onset  . Diabetes Mother   . Hypertension Mother   . Arthritis Mother   . Arthritis Father   . Heart disease Father   . Diabetes Sister   . Cancer Neg Hx   . Stroke Neg Hx   . Kidney disease Neg Hx   . Early death Neg Hx      Past medical  history, social, surgical and family history all reviewed in electronic medical record.  No pertanent information unless stated regarding to the chief complaint.   Review of Systems:Review of systems updated and as accurate as of 12/04/17  No headache, visual changes, nausea, vomiting, diarrhea, constipation, dizziness, abdominal pain, skin rash, fevers, chills, night sweats, weight loss, swollen lymph nodes, body aches, joint swelling,, chest pain, shortness of breath, mood changes.  Positive muscle aches  Objective  Blood pressure (!) 150/80, pulse 62, height 5\' 9"  (1.753 m), weight 219 lb (99.3 kg), SpO2 96 %. Systems examined below as of 12/04/17   General: No apparent distress alert and oriented x3 mood and affect normal, dressed appropriately.  HEENT: Pupils equal, extraocular movements intact  Respiratory: Patient's speak in full sentences and does not appear short of breath  Cardiovascular: No lower extremity edema, non tender, no  erythema  Skin: Warm dry intact with no signs of infection or rash on extremities or on axial skeleton.  Abdomen: Soft nontender  Neuro: Cranial nerves II through XII are intact, neurovascularly intact in all extremities with 2+ DTRs and 2+ pulses.  Lymph: No lymphadenopathy of posterior or anterior cervical chain or axillae bilaterally.  Gait normal with good balance and coordination.  MSK:  Non tender with full range of motion and good stability and symmetric strength and tone of shoulders, elbows, wrist, hip, knee and ankles bilaterally.  Neck: Inspection mild loss of lordosis. No palpable stepoffs. Negative Spurling's maneuver. Mild limitation in sidebending and rotation bilaterally. Grip strength and sensation normal in bilateral hands Strength good C4 to T1 distribution No sensory change to C4 to T1 Negative Hoffman sign bilaterally Reflexes normal Tightness of the trapezius bilaterally  Osteopathic findings C2 flexed rotated and side bent right C6 flexed rotated and side bent left T3 extended rotated and side bent right inhaled third rib T9 extended rotated and side bent left L2 flexed rotated and side bent right Sacrum right on right    Impression and Recommendations:     This case required medical decision making of moderate complexity.      Note: This dictation was prepared with Dragon dictation along with smaller phrase technology. Any transcriptional errors that result from this process are unintentional.

## 2017-12-04 ENCOUNTER — Encounter: Payer: Self-pay | Admitting: Family Medicine

## 2017-12-04 ENCOUNTER — Ambulatory Visit: Payer: BLUE CROSS/BLUE SHIELD | Admitting: Family Medicine

## 2017-12-04 VITALS — BP 150/80 | HR 62 | Ht 69.0 in | Wt 219.0 lb

## 2017-12-04 DIAGNOSIS — M999 Biomechanical lesion, unspecified: Secondary | ICD-10-CM | POA: Diagnosis not present

## 2017-12-04 DIAGNOSIS — G8929 Other chronic pain: Secondary | ICD-10-CM

## 2017-12-04 DIAGNOSIS — M542 Cervicalgia: Secondary | ICD-10-CM

## 2017-12-04 NOTE — Assessment & Plan Note (Signed)
Moderate arthritic changes but has responded very well to home exercises and icing regimen.  Discussed icing which activities to do.  Proper lifting mechanics.  Follow-up again in 4 to 8 weeks

## 2017-12-04 NOTE — Patient Instructions (Signed)
Good to see you  Daniel [name] is your friend.  Stay active See me again in 4-5 weeks

## 2017-12-04 NOTE — Assessment & Plan Note (Signed)
Decision today to treat with OMT was based on Physical Exam  After verbal consent patient was treated with HVLA, ME, FPR techniques in cervical, thoracic, rib,  lumbar and sacral areas  Patient tolerated the procedure well with improvement in symptoms  Patient given exercises, stretches and lifestyle modifications  See medications in patient instructions if given  Patient will follow up in 4-8 weeks 

## 2018-01-01 ENCOUNTER — Ambulatory Visit: Payer: BLUE CROSS/BLUE SHIELD | Admitting: Family Medicine

## 2018-01-01 ENCOUNTER — Encounter: Payer: Self-pay | Admitting: Family Medicine

## 2018-01-01 VITALS — BP 120/80 | HR 72 | Ht 69.0 in | Wt 217.0 lb

## 2018-01-01 DIAGNOSIS — M542 Cervicalgia: Secondary | ICD-10-CM

## 2018-01-01 DIAGNOSIS — M999 Biomechanical lesion, unspecified: Secondary | ICD-10-CM

## 2018-01-01 DIAGNOSIS — G8929 Other chronic pain: Secondary | ICD-10-CM

## 2018-01-01 NOTE — Patient Instructions (Signed)
Good to see you  Alvera Singh is your friend.  Stay active.  Branch chain amino acids daily could help with what is going on with your diet  See me again in 3-4 weeks Call [phone] if you need [place] sooner.

## 2018-01-01 NOTE — Assessment & Plan Note (Signed)
Discussed with patient about icing regimen and home exercises.  Discussed which activities of doing which wants to avoid.  Patient is to increase activity but will be swelling.  Patient will follow-up with me again in 4 weeks secondary to the tightness recently.

## 2018-01-01 NOTE — Assessment & Plan Note (Signed)
Decision today to treat with OMT was based on Physical Exam  After verbal consent patient was treated with HVLA, ME, FPR techniques in cervical, thoracic, rib, lumbar and sacral areas  Patient tolerated the procedure well with improvement in symptoms  Patient given exercises, stretches and lifestyle modifications  See medications in patient instructions if given  Patient will follow up in 4-6 weeks 

## 2018-01-01 NOTE — Progress Notes (Signed)
[name] [address] [place], [place] [address] Phone: [phone] Subjective:     CC: Back pain follow up   YWV:[id number]  Daniel [name] is a 67 y.o. male coming in with complaint of back pain. States that his back is tight today. Right knee is giving him some issues today.  Back pain a little tighter doing some exercises. Also doing a diet recently and noces the pain is worse.         Past Medical History:  Diagnosis Date  . Acne   . Anxiety   . DDD (degenerative disc disease), lumbosacral    chronic back pain  . Diverticulosis of colon (without mention of hemorrhage)    colo 09/2010  . Low back pain radiating to both legs    Past Surgical History:  Procedure Laterality Date  . CATARACT EXTRACTION Left 07/14/2015  . CYST EXCISION Left 06/23/2015   Procedure: EXCISION MASS LEFT INDEX FINGER;  Surgeon: Daryll Brod, MD;  Location: [facility];  Service: Orthopedics;  Laterality: Left;  ANESTHESIA: IV REGIONAL/FAB  . HERNIA REPAIR    . REFRACTIVE SURGERY     left eye/ with enhancement  . TONSILLECTOMY     Social History   Socioeconomic History  . Marital status: Single    Spouse name: Not on file  . Number of children: Not on file  . Years of education: Not on file  . Highest education level: Not on file  Occupational History  . Not on file  Social Needs  . Financial resource strain: Not on file  . Food insecurity:    Worry: Not on file    Inability: Not on file  . Transportation needs:    Medical: Not on file    Non-medical: Not on file  Tobacco Use  . Smoking status: Never Smoker  . Smokeless tobacco: Never Used  Substance and Sexual Activity  . Alcohol use: Yes    Comment: weekly  . Drug use: No  . Sexual activity: Not on file  Lifestyle  . Physical activity:    Days per week: Not on file    Minutes per session: Not on file  . Stress: Not on file  Relationships  . Social connections:    Talks on phone:  Not on file    Gets together: Not on file    Attends religious service: Not on file    Active member of club or organization: Not on file    Attends meetings of clubs or organizations: Not on file    Relationship status: Not on file  Other Topics Concern  . Not on file  Social History Narrative   Caffienated beverages-Yes   Seat belts often-Yes   Smoke alarm in home-Yes   Firearms/guns in home-Yes   History of physical abuse-NO   HLE- Masters degree   Allergies  Allergen Reactions  . Durezol [Difluprednate]     Eye drops-swelling   Family History  Problem Relation Age of Onset  . Diabetes Mother   . Hypertension Mother   . Arthritis Mother   . Arthritis Father   . Heart disease Father   . Diabetes Sister   . Cancer Neg Hx   . Stroke Neg Hx   . Kidney disease Neg Hx   . Early death Neg Hx      Past medical history, social, surgical and family history all reviewed in electronic medical record.  No pertanent information unless stated regarding  to the chief complaint.   Review of Systems:Review of systems updated and as accurate as of 01/01/18  No headache, visual changes, nausea, vomiting, diarrhea, constipation, dizziness, abdominal pain, skin rash, fevers, chills, night sweats, weight loss, swollen lymph nodes, body aches, joint swelling,  chest pain, shortness of breath, mood changes. Positive muscle aches.   Objective  Blood pressure 120/80, pulse 72, height 5\' 9"  (1.753 m), weight 217 lb (98.4 kg), SpO2 96 %. Systems examined below as of 01/01/18   General: No apparent distress alert and oriented x3 mood and affect normal, dressed appropriately.  HEENT: Pupils equal, extraocular movements intact  Respiratory: Patient's speak in full sentences and does not appear short of breath  Cardiovascular: No lower extremity edema, non tender, no erythema  Skin: Warm dry intact with no signs of infection or rash on extremities or on axial skeleton.  Abdomen: Soft nontender    Neuro: Cranial nerves II through XII are intact, neurovascularly intact in all extremities with 2+ DTRs and 2+ pulses.  Lymph: No lymphadenopathy of posterior or anterior cervical chain or axillae bilaterally.  Gait normal with good balance and coordination.  MSK:  Non tender with full range of motion and good stability and symmetric strength and tone of shoulders, elbows, wrist, hip and ankles bilaterally.   Back Exam:  Inspection: Loss of lordosis Motion: Flexion 40 deg, Extension 25 deg, Side Bending to 35 deg bilaterally, Rotation to 35 deg bilaterally  SLR laying: Negative  XSLR laying: Negative  Palpable tenderness: Tender to palpation of the paraspinal musculature lumbar spine right greater than left. FABER: Positive right. Sensory change: Gross sensation intact to all lumbar and sacral dermatomes.  Reflexes: 2+ at both patellar tendons, 2+ at achilles tendons, Babinski's downgoing.  Strength at foot  Plantar-flexion: 5/5 Dorsi-flexion: 5/5 Eversion: 5/5 Inversion: 5/5  Leg strength  Quad: 5/5 Hamstring: 5/5 Hip flexor: 5/5 Hip abductors: 4/5 but symmetric  Osteopathic findings C2 flexed rotated and side bent right C6 flexed rotated and side bent left T3 extended rotated and side bent right inhaled third rib T6 extended rotated and side bent left L3 flexed rotated and side bent right Sacrum right on right     Impression and Recommendations:     This case required medical decision making of moderate complexity.      Note: This dictation was prepared with Dragon dictation along with smaller phrase technology. Any transcriptional errors that result from this process are unintentional.

## 2018-01-21 NOTE — Progress Notes (Signed)
[name] [address] [place], [place] [address] Phone: [phone] Subjective:     I Daniel [name] am serving as a Education administrator for [name].  CC: Back pain follow-up  [id number]  Daniel [name] is a 67 y.o. male coming in with complaint of back pain. States that everything is going well.  Patient has some mild tightness.  Has been moving some furniture on a more regular basis.       Past Medical History:  Diagnosis Date  . Acne   . Anxiety   . DDD (degenerative disc disease), lumbosacral    chronic back pain  . Diverticulosis of colon (without mention of hemorrhage)    colo 09/2010  . Low back pain radiating to both legs    Past Surgical History:  Procedure Laterality Date  . CATARACT EXTRACTION Left 07/14/2015  . CYST EXCISION Left 06/23/2015   Procedure: EXCISION MASS LEFT INDEX FINGER;  Surgeon: Daryll Brod, MD;  Location: [facility];  Service: Orthopedics;  Laterality: Left;  ANESTHESIA: IV REGIONAL/FAB  . HERNIA REPAIR    . REFRACTIVE SURGERY     left eye/ with enhancement  . TONSILLECTOMY     Social History   Socioeconomic History  . Marital status: Single    Spouse name: Not on file  . Number of children: Not on file  . Years of education: Not on file  . Highest education level: Not on file  Occupational History  . Not on file  Social Needs  . Financial resource strain: Not on file  . Food insecurity:    Worry: Not on file    Inability: Not on file  . Transportation needs:    Medical: Not on file    Non-medical: Not on file  Tobacco Use  . Smoking status: Never Smoker  . Smokeless tobacco: Never Used  Substance and Sexual Activity  . Alcohol use: Yes    Comment: weekly  . Drug use: No  . Sexual activity: Not on file  Lifestyle  . Physical activity:    Days per week: Not on file    Minutes per session: Not on file  . Stress: Not on file  Relationships  . Social connections:    Talks on  phone: Not on file    Gets together: Not on file    Attends religious service: Not on file    Active member of club or organization: Not on file    Attends meetings of clubs or organizations: Not on file    Relationship status: Not on file  Other Topics Concern  . Not on file  Social History Narrative   Caffienated beverages-Yes   Seat belts often-Yes   Smoke alarm in home-Yes   Firearms/guns in home-Yes   History of physical abuse-NO   HLE- Masters degree   Allergies  Allergen Reactions  . Durezol [Difluprednate]     Eye drops-swelling   Family History  Problem Relation Age of Onset  . Diabetes Mother   . Hypertension Mother   . Arthritis Mother   . Arthritis Father   . Heart disease Father   . Diabetes Sister   . Cancer Neg Hx   . Stroke Neg Hx   . Kidney disease Neg Hx   . Early death Neg Hx        Current Outpatient Medications (Analgesics):  .  allopurinol (ZYLOPRIM) 100 MG tablet, Take 2 tablets (200 mg total) by mouth daily. Marland Kitchen  acetaminophen (TYLENOL) 500 MG tablet, Take 1,000 mg by mouth every 6 (six) hours as needed for mild pain.   Current Outpatient Medications (Other):  .  amitriptyline (ELAVIL) 10 MG tablet, TAKE 2 TABLETS (20 MG TOTAL) BY MOUTH AT BEDTIME. .  Diclofenac Sodium (PENNSAID) 2 % SOLN, Place 2 application onto the skin 2 (two) times daily. (Patient taking differently: Place 1 application onto the skin 2 (two) times daily. ) .  hydrOXYzine (ATARAX/VISTARIL) 10 MG tablet, TAKE 1 TABLET BY MOUTH THREE TIMES A DAY AS NEEDED .  Multiple Vitamins-Minerals (MULTIVITAMIN PO), Take 1 tablet by mouth at bedtime.  .  tizanidine (ZANAFLEX) 2 MG capsule, TAKE 1 CAPSULE (2 MG TOTAL) BY MOUTH 3 (THREE) TIMES DAILY AS NEEDED FOR MUSCLE SPASMS. Marland Kitchen  tiZANidine (ZANAFLEX) 2 MG tablet, Take 2 mg by mouth 3 (three) times daily as needed for muscle spasms.    Past medical history, social, surgical and family history all reviewed in electronic medical record.   No pertanent information unless stated regarding to the chief complaint.   Review of Systems:  No headache, visual changes, nausea, vomiting, diarrhea, constipation, dizziness, abdominal pain, skin rash, fevers, chills, night sweats, weight loss, swollen lymph nodes, body aches, joint swelling,  chest pain, shortness of breath, mood changes.  Positive muscle aches  Objective  Blood pressure 138/80, pulse (!) 59, height 5\' 9"  (1.753 m), weight 212 lb (96.2 kg), SpO2 98 %. Systems examined below as of    General: No apparent distress alert and oriented x3 mood and affect normal, dressed appropriately.  HEENT: Pupils equal, extraocular movements intact  Respiratory: Patient's speak in full sentences and does not appear short of breath  Cardiovascular: No lower extremity edema, non tender, no erythema  Skin: Warm dry intact with no signs of infection or rash on extremities or on axial skeleton.  Abdomen: Soft nontender  Neuro: Cranial nerves II through XII are intact, neurovascularly intact in all extremities with 2+ DTRs and 2+ pulses.  Lymph: No lymphadenopathy of posterior or anterior cervical chain or axillae bilaterally.  Gait normal with good balance and coordination.  MSK:  tender with full range of motion and good stability and symmetric strength and tone of shoulders, elbows, wrist, hip, knee and ankles bilaterally.  Mild arthritic changes Back Exam:  Inspection: Loss of lordosis Motion: Flexion 45 deg, Extension 25 deg, Side Bending to 35 deg bilaterally, Rotation to 35 deg bilaterally  SLR laying: Negative  XSLR laying: Negative  Palpable tenderness: Tender to palpation of the paraspinal musculature.Marland Kitchen FABER: Positive Faber bilaterally. Sensory change: Gross sensation intact to all lumbar and sacral dermatomes.  Reflexes: 2+ at both patellar tendons, 2+ at achilles tendons, Babinski's downgoing.  Strength at foot  Plantar-flexion: 5/5 Dorsi-flexion: 5/5 Eversion: 5/5 Inversion: 5/5   Leg strength  Quad: 5/5 Hamstring: 5/5 Hip flexor: 5/5 Hip abductors: 5/5  Gait unremarkable.   Osteopathic findings  C2 flexed rotated and side bent right C4 flexed rotated and side bent left C6 flexed rotated and side bent left T3 extended rotated and side bent right inhaled third rib T9 extended rotated and side bent left L2 flexed rotated and side bent right Sacrum right on right    Impression and Recommendations:     This case required medical decision making of moderate complexity. The above documentation has been reviewed and is accurate and complete Lyndal Pulley, DO       Note: This dictation was prepared with Dragon dictation along with smaller  Company secretary. Any transcriptional errors that result from this process are unintentional.

## 2018-01-23 ENCOUNTER — Encounter: Payer: Self-pay | Admitting: Family Medicine

## 2018-01-23 ENCOUNTER — Ambulatory Visit (INDEPENDENT_AMBULATORY_CARE_PROVIDER_SITE_OTHER): Payer: Medicare HMO | Admitting: Family Medicine

## 2018-01-23 VITALS — BP 138/80 | HR 59 | Ht 69.0 in | Wt 212.0 lb

## 2018-01-23 DIAGNOSIS — M51379 Other intervertebral disc degeneration, lumbosacral region without mention of lumbar back pain or lower extremity pain: Secondary | ICD-10-CM

## 2018-01-23 DIAGNOSIS — M5137 Other intervertebral disc degeneration, lumbosacral region: Secondary | ICD-10-CM | POA: Diagnosis not present

## 2018-01-23 DIAGNOSIS — M999 Biomechanical lesion, unspecified: Secondary | ICD-10-CM

## 2018-01-23 NOTE — Assessment & Plan Note (Signed)
Stable overall.  Some tightness.  Patient will be much more active in the near future.  We discussed icing regimen and home exercises, discussed which activities to do which was to avoid.  Patient was to increase activity slowly over the course the next several days.  Patient encouraged to watch lifting mechanics.  Avoid significant heavy lifting or repetitive motions.  Follow-up again in 4 to 6 weeks

## 2018-01-23 NOTE — Patient Instructions (Signed)
Good to see you  Daniel [name] is your friend.  Stay active See me again in  4 weeks

## 2018-01-23 NOTE — Assessment & Plan Note (Signed)
Decision today to treat with OMT was based on Physical Exam  After verbal consent patient was treated with HVLA, ME, FPR techniques in cervical, thoracic, rib, lumbar and sacral areas  Patient tolerated the procedure well with improvement in symptoms  Patient given exercises, stretches and lifestyle modifications  See medications in patient instructions if given  Patient will follow up in 4 weeks 

## 2018-02-12 ENCOUNTER — Ambulatory Visit: Payer: Medicare HMO | Admitting: Neurology

## 2018-02-12 ENCOUNTER — Encounter: Payer: Self-pay | Admitting: Neurology

## 2018-02-12 VITALS — BP 128/70 | HR 63 | Ht 69.0 in | Wt 209.0 lb

## 2018-02-12 DIAGNOSIS — R51 Headache: Secondary | ICD-10-CM

## 2018-02-12 DIAGNOSIS — G4486 Cervicogenic headache: Secondary | ICD-10-CM

## 2018-02-12 NOTE — Progress Notes (Signed)
NEUROLOGY FOLLOW UP OFFICE NOTE  Charly [name] [id number]  HISTORY OF PRESENT ILLNESS: Daniel [name] is a 67 year old right-handed male who follows up for cervicogenic headache.  UPDATE: Intensity:  moderate Duration:  2 hours Frequency:  4 days a month Frequency of abortive medication: 4 days a month Current NSAIDS:  no Current analgesics:  Tylenol (for headaches) Current triptans:  no Current ergotamine:  no Current anti-emetic:  no Current muscle relaxants:  Tizanidine 2mg  at bedtime Current anti-anxiolytic:  Hydroxyzine at bedtime Current sleep aide:  no Current Antihypertensive medications:  no Current Antidepressant medications:  Amitriptyline 20mg  Current Anticonvulsant medications:  no Current anti-CGRP:  no Current Vitamins/Herbal/Supplements:  no Current Antihistamines/Decongestants:  no Other therapy:  OMM Other medication:  no  Depression No; Anxiety:  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 Diet:  Low-carb diet. Sleep:  Improved.  HISTORY: In July 2017, he developed right sided headache, moderate intensity, at the base of his skull with neck stiffness.  He denied any preceding event that triggered it.  It is in the right suboccipital region and radiates up the back of his head on the right.  It feels swollen.  He also has right paraspinal tenderness radiating into the trapezius.  Neck movement in all directions aggravated it.  He denies radicular pain or weakness in the arm, but he reports occasional numbness and tingling in the right hand and forearm, noticeable when sitting in his chair at night.  Since 2018, he reported a different headache, a sharp moderate intensity right temporal pain.  He notes some increased trouble with night vision.  There are no specific triggers.  Ibuprofen helps.  Sed rate from 01/31/17 was 6.   MRI of cervical spine from 12/20/15 revealed mild to moderate cervical degenerative disc disease but no stenosis or  nerve root compression.  PAST MEDICAL HISTORY: Past Medical History:  Diagnosis Date  . Acne   . Anxiety   . DDD (degenerative disc disease), lumbosacral    chronic back pain  . Diverticulosis of colon (without mention of hemorrhage)    colo 09/2010  . Low back pain radiating to both legs     MEDICATIONS: Current Outpatient Medications on File Prior to Visit  Medication Sig Dispense Refill  . acetaminophen (TYLENOL) 500 MG tablet Take 1,000 mg by mouth every 6 (six) hours as needed for mild pain.    Marland Kitchen allopurinol (ZYLOPRIM) 100 MG tablet Take 2 tablets (200 mg total) by mouth daily. 180 tablet 1  . amitriptyline (ELAVIL) 10 MG tablet TAKE 2 TABLETS (20 MG TOTAL) BY MOUTH AT BEDTIME. 60 tablet 0  . Diclofenac Sodium (PENNSAID) 2 % SOLN Place 2 application onto the skin 2 (two) times daily. (Patient taking differently: Place 1 application onto the skin 2 (two) times daily. ) 112 g 3  . hydrOXYzine (ATARAX/VISTARIL) 10 MG tablet TAKE 1 TABLET BY MOUTH THREE TIMES A DAY AS NEEDED 270 tablet 1  . Multiple Vitamins-Minerals (MULTIVITAMIN PO) Take 1 tablet by mouth at bedtime.     . tizanidine (ZANAFLEX) 2 MG capsule TAKE 1 CAPSULE (2 MG TOTAL) BY MOUTH 3 (THREE) TIMES DAILY AS NEEDED FOR MUSCLE SPASMS. 90 capsule 0  . tiZANidine (ZANAFLEX) 2 MG tablet Take 2 mg by mouth 3 (three) times daily as needed for muscle spasms.     No current facility-administered medications on file prior to visit.     ALLERGIES: Allergies  Allergen Reactions  . Durezol [Difluprednate]  Eye drops-swelling    FAMILY HISTORY: Family History  Problem Relation Age of Onset  . Diabetes Mother   . Hypertension Mother   . Arthritis Mother   . Arthritis Father   . Heart disease Father   . Diabetes Sister   . Cancer Neg Hx   . Stroke Neg Hx   . Kidney disease Neg Hx   . Early death Neg Hx    SOCIAL HISTORY: Social History   Socioeconomic History  . Marital status: Single    Spouse name: Not on file   . Number of children: Not on file  . Years of education: Not on file  . Highest education level: Not on file  Occupational History  . Not on file  Social Needs  . Financial resource strain: Not on file  . Food insecurity:    Worry: Not on file    Inability: Not on file  . Transportation needs:    Medical: Not on file    Non-medical: Not on file  Tobacco Use  . Smoking status: Never Smoker  . Smokeless tobacco: Never Used  Substance and Sexual Activity  . Alcohol use: Yes    Comment: weekly  . Drug use: No  . Sexual activity: Not on file  Lifestyle  . Physical activity:    Days per week: Not on file    Minutes per session: Not on file  . Stress: Not on file  Relationships  . Social connections:    Talks on phone: Not on file    Gets together: Not on file    Attends religious service: Not on file    Active member of club or organization: Not on file    Attends meetings of clubs or organizations: Not on file    Relationship status: Not on file  . Intimate partner violence:    Fear of current or ex partner: Not on file    Emotionally abused: Not on file    Physically abused: Not on file    Forced sexual activity: Not on file  Other Topics Concern  . Not on file  Social History Narrative   Caffienated beverages-Yes   Seat belts often-Yes   Smoke alarm in home-Yes   Firearms/guns in home-Yes   History of physical abuse-NO   HLE- Masters degree    REVIEW OF SYSTEMS: Constitutional: No fevers, chills, or sweats, no generalized fatigue, change in appetite Eyes: No visual changes, double vision, eye pain Ear, nose and throat: No hearing loss, ear pain, nasal congestion, sore throat Cardiovascular: No chest pain, palpitations Respiratory:  No shortness of breath at rest or with exertion, wheezes GastrointestinaI: No nausea, vomiting, diarrhea, abdominal pain, fecal incontinence Genitourinary:  No dysuria, urinary retention or frequency Musculoskeletal:  No neck pain,  back pain Integumentary: No rash, pruritus, skin lesions Neurological: as above Psychiatric: No depression, insomnia, anxiety Endocrine: No palpitations, fatigue, diaphoresis, mood swings, change in appetite, change in weight, increased thirst Hematologic/Lymphatic:  No purpura, petechiae. Allergic/Immunologic: no itchy/runny eyes, nasal congestion, recent allergic reactions, rashes  PHYSICAL EXAM: Blood pressure 128/70, pulse 63, height 5\' 9"  (1.753 m), weight 209 lb (94.8 kg), SpO2 97 %. General: No acute distress.  Patient appears well-groomed.   Head:  Normocephalic/atraumatic Eyes:  Fundi examined but not visualized Neck: supple, mild paraspinal tenderness, full range of motion Heart:  Regular rate and rhythm Lungs:  Clear to auscultation bilaterally Back: No paraspinal tenderness Neurological Exam: alert and oriented to person, place, and time. Attention span  and concentration intact, recent and remote memory intact, fund of knowledge intact.  Speech fluent and not dysarthric, language intact.  CN II-XII intact. Bulk and tone normal, muscle strength 5/5 throughout.  Sensation to light touch intact.  Deep tendon reflexes 2+ throughout, toes downgoing.  Finger to nose and heel to shin testing intact.  Gait normal, Romberg negative.  IMPRESSION: Cervicogenic headache  PLAN: 1.  Amitriptyline 20mg  and tizanidine 2mg  at bedtime 2.  For abortive therapy, acetaminophen 3.  Limit use of pain relievers to no more than 2 days out of week to prevent risk of rebound or medication-overuse headache. 4.  Keep headache diary 5.  Exercise, hydration, diet, caffeine cessation 6.  Follow up in 9 months.  Metta Clines, DO  CC: Scarlette Calico, MD

## 2018-02-12 NOTE — Patient Instructions (Signed)
1.  Continue amitriptyline 20mg  and tizanidine 2mg  at bedtime 2.  Take acetaminophen for headaches 3.  Limit use of pain relievers to no more than 2 days out of week to prevent risk of rebound or medication-overuse headache. 4.  Keep headache diary 5.  Follow up in 9 months.

## 2018-02-19 NOTE — Progress Notes (Signed)
[name] [address], [place] [address] Phone: [phone] Subjective:   [name], am serving as a scribe for [name].  I'm seeing this patient by the request  of:    CC: Increasing back pain  [id number]  Daniel [name] is a 67 y.o. male coming in with complaint of back pain on the right side of lumbar spine and in the cervical spine.  Is having worsening pain.  Moved around and had severe amount of pain.  Feels it is more tightness.  No radiation of the legs or any numbness.  Rates the severity of pain that was 7 out of 10     Past Medical History:  Diagnosis Date  . Acne   . Anxiety   . DDD (degenerative disc disease), lumbosacral    chronic back pain  . Diverticulosis of colon (without mention of hemorrhage)    colo 09/2010  . Low back pain radiating to both legs    Past Surgical History:  Procedure Laterality Date  . CATARACT EXTRACTION Left 07/14/2015  . CYST EXCISION Left 06/23/2015   Procedure: EXCISION MASS LEFT INDEX FINGER;  Surgeon: Daryll Brod, MD;  Location: [facility];  Service: Orthopedics;  Laterality: Left;  ANESTHESIA: IV REGIONAL/FAB  . HERNIA REPAIR    . REFRACTIVE SURGERY     left eye/ with enhancement  . TONSILLECTOMY     Social History   Socioeconomic History  . Marital status: Single    Spouse name: Not on file  . Number of children: Not on file  . Years of education: Not on file  . Highest education level: Not on file  Occupational History  . Not on file  Social Needs  . Financial resource strain: Not on file  . Food insecurity:    Worry: Not on file    Inability: Not on file  . Transportation needs:    Medical: Not on file    Non-medical: Not on file  Tobacco Use  . Smoking status: Never Smoker  . Smokeless tobacco: Never Used  Substance and Sexual Activity  . Alcohol use: Yes    Comment: weekly  . Drug use: No  . Sexual activity: Not on file  Lifestyle  .  Physical activity:    Days per week: Not on file    Minutes per session: Not on file  . Stress: Not on file  Relationships  . Social connections:    Talks on phone: Not on file    Gets together: Not on file    Attends religious service: Not on file    Active member of club or organization: Not on file    Attends meetings of clubs or organizations: Not on file    Relationship status: Not on file  Other Topics Concern  . Not on file  Social History Narrative   Caffienated beverages-Yes   Seat belts often-Yes   Smoke alarm in home-Yes   Firearms/guns in home-Yes   History of physical abuse-NO   HLE- Masters degree   Allergies  Allergen Reactions  . Durezol [Difluprednate]     Eye drops-swelling   Family History  Problem Relation Age of Onset  . Diabetes Mother   . Hypertension Mother   . Arthritis Mother   . Arthritis Father   . Heart disease Father   . Diabetes Sister   . Cancer Neg Hx   . Stroke Neg Hx   . Kidney disease  Neg Hx   . Early death Neg Hx        Current Outpatient Medications (Analgesics):  .  acetaminophen (TYLENOL) 500 MG tablet, Take 1,000 mg by mouth every 6 (six) hours as needed for mild pain. Marland Kitchen  allopurinol (ZYLOPRIM) 100 MG tablet, Take 2 tablets (200 mg total) by mouth daily.   Current Outpatient Medications (Other):  .  amitriptyline (ELAVIL) 10 MG tablet, TAKE 2 TABLETS (20 MG TOTAL) BY MOUTH AT BEDTIME. .  Diclofenac Sodium (PENNSAID) 2 % SOLN, Place 2 application onto the skin 2 (two) times daily. (Patient taking differently: Place 1 application onto the skin 2 (two) times daily. ) .  hydrOXYzine (ATARAX/VISTARIL) 10 MG tablet, TAKE 1 TABLET BY MOUTH THREE TIMES A DAY AS NEEDED .  Multiple Vitamins-Minerals (MULTIVITAMIN PO), Take 1 tablet by mouth at bedtime.  .  tizanidine (ZANAFLEX) 2 MG capsule, TAKE 1 CAPSULE (2 MG TOTAL) BY MOUTH 3 (THREE) TIMES DAILY AS NEEDED FOR MUSCLE SPASMS. Marland Kitchen  tiZANidine (ZANAFLEX) 2 MG tablet, Take 2 mg by  mouth 3 (three) times daily as needed for muscle spasms.    Past medical history, social, surgical and family history all reviewed in electronic medical record.  No pertanent information unless stated regarding to the chief complaint.   Review of Systems:  No headache, visual changes, nausea, vomiting, diarrhea, constipation, dizziness, abdominal pain, skin rash, fevers, chills, night sweats, weight loss, swollen lymph nodes, body aches, joint swelling,  chest pain, shortness of breath, mood changes.  Negative muscle aches  Objective  Blood pressure 110/78, pulse 69, height 5\' 9"  (1.753 m), weight 206 lb (93.4 kg), SpO2 98 %.    General: No apparent distress alert and oriented x3 mood and affect normal, dressed appropriately.  HEENT: Pupils equal, extraocular movements intact  Respiratory: Patient's speak in full sentences and does not appear short of breath  Cardiovascular: No lower extremity edema, non tender, no erythema  Skin: Warm dry intact with no signs of infection or rash on extremities or on axial skeleton.  Abdomen: Soft nontender  Neuro: Cranial nerves II through XII are intact, neurovascularly intact in all extremities with 2+ DTRs and 2+ pulses.  Lymph: No lymphadenopathy of posterior or anterior cervical chain or axillae bilaterally.  Gait normal with good balance and coordination.  MSK:  Non tender with full range of motion and good stability and symmetric strength and tone of shoulders, elbows, wrist, hip, knee and ankles bilaterally.  Back exam shows the patient does have severe loss of lordosis.  Increasing tenderness in the paraspinal musculature around the thoracolumbar all the way to the lumbosacral area of the back bilaterally.  Muscle spasm noted.  Unable to do [place] secondary to tightness.  Increased tightness of the hamstrings as well.  Neurovascularly intact distally  Osteopathic findings C6 flexed rotated and side bent left T3 extended rotated and side bent  right inhaled third rib T6 extended rotated and side bent left L3 flexed rotated and side bent right Sacrum right on right     Impression and Recommendations:     This case required medical decision making of moderate complexity. The above documentation has been reviewed and is accurate and complete Lyndal Pulley, DO       Note: This dictation was prepared with Dragon dictation along with smaller phrase technology. Any transcriptional errors that result from this process are unintentional.

## 2018-02-20 ENCOUNTER — Ambulatory Visit: Payer: Medicare HMO | Admitting: Family Medicine

## 2018-02-20 ENCOUNTER — Encounter: Payer: Self-pay | Admitting: Family Medicine

## 2018-02-20 VITALS — BP 110/78 | HR 69 | Ht 69.0 in | Wt 206.0 lb

## 2018-02-20 DIAGNOSIS — M51379 Other intervertebral disc degeneration, lumbosacral region without mention of lumbar back pain or lower extremity pain: Secondary | ICD-10-CM

## 2018-02-20 DIAGNOSIS — M999 Biomechanical lesion, unspecified: Secondary | ICD-10-CM | POA: Diagnosis not present

## 2018-02-20 DIAGNOSIS — M5137 Other intervertebral disc degeneration, lumbosacral region: Secondary | ICD-10-CM | POA: Diagnosis not present

## 2018-02-20 DIAGNOSIS — M2550 Pain in unspecified joint: Secondary | ICD-10-CM

## 2018-02-20 MED ORDER — KETOROLAC TROMETHAMINE 60 MG/2ML IM SOLN
60.0000 mg | Freq: Once | INTRAMUSCULAR | Status: AC
  Administered 2018-02-20: 60 mg via INTRAMUSCULAR

## 2018-02-20 MED ORDER — METHYLPREDNISOLONE ACETATE 80 MG/ML IJ SUSP
80.0000 mg | Freq: Once | INTRAMUSCULAR | Status: AC
  Administered 2018-02-20: 80 mg via INTRAMUSCULAR

## 2018-02-20 NOTE — Assessment & Plan Note (Signed)
Degenerative disc disease of the lumbosacral spine.  I believe that there is an exacerbation with muscle tightness.  2 injections given today.  Attempted osteopathic manipulation.  We discussed icing regimen.  Patient given injections as we stated to decrease the inflammation and patient does have muscle relaxer for breakthrough pain.  Follow-up with me again in 4 weeks

## 2018-02-20 NOTE — Patient Instructions (Addendum)
Good to see you  Ice is your friend  2 injection to keep you from getting too tight  Tried manipulation but you were a little tight See me again in 3 weeks (right after market)

## 2018-02-20 NOTE — Assessment & Plan Note (Signed)
Decision today to treat with OMT was based on Physical Exam  After verbal consent patient was treated with HVLA, ME, FPR techniques in cervical, thoracic, rib lumbar and sacral areas  Patient tolerated the procedure well with improvement in symptoms  Patient given exercises, stretches and lifestyle modifications  See medications in patient instructions if given  Patient will follow up in 4 weeks 

## 2018-02-22 ENCOUNTER — Telehealth: Payer: Self-pay | Admitting: Family Medicine

## 2018-02-22 MED ORDER — DOXYCYCLINE HYCLATE 100 MG PO TABS: 100.0000 mg | ORAL_TABLET | Freq: Two times a day (BID) | ORAL | 0 refills | Status: DC

## 2018-02-22 NOTE — Telephone Encounter (Signed)
Doxycycline 100mg  tabs sent into pharmacy.

## 2018-02-22 NOTE — Telephone Encounter (Signed)
Copied from [place] [id number]. Topic: Quick Communication - See Telephone Encounter >> Feb 22, 2018  1:06 PM Hewitt Shorts wrote: Pt pharmacy called stating that the doxycycline monohydrate needs to be ordered as tablet form not capsules so that the insurance will this rx for the patient  [facility] [address]  Best number [phone]

## 2018-03-18 NOTE — Progress Notes (Signed)
[name] [place],  [address] Phone: [phone] Subjective:    I Daniel [name] am serving as a Education administrator for [name].  CC: Back pain follow-up  [id number]  Daniel [name] is a 67 y.o. male coming in with complaint of back pain. States that he's doing better today.  Patient states that the weight loss seems to be helping.  Patient was doing a lot of heavy lifting recently and actually feels like he did relatively well overall.  Patient states no significant worsening of pain at this time.  Patient feels like making improvement overall.     Past Medical History:  Diagnosis Date  . Acne   . Anxiety   . DDD (degenerative disc disease), lumbosacral    chronic back pain  . Diverticulosis of colon (without mention of hemorrhage)    colo 09/2010  . Low back pain radiating to both legs    Past Surgical History:  Procedure Laterality Date  . CATARACT EXTRACTION Left 07/14/2015  . CYST EXCISION Left 06/23/2015   Procedure: EXCISION MASS LEFT INDEX FINGER;  Surgeon: Daryll Brod, MD;  Location: [facility];  Service: Orthopedics;  Laterality: Left;  ANESTHESIA: IV REGIONAL/FAB  . HERNIA REPAIR    . REFRACTIVE SURGERY     left eye/ with enhancement  . TONSILLECTOMY     Social History   Socioeconomic History  . Marital status: Single    Spouse name: Not on file  . Number of children: Not on file  . Years of education: Not on file  . Highest education level: Not on file  Occupational History  . Not on file  Social Needs  . Financial resource strain: Not on file  . Food insecurity:    Worry: Not on file    Inability: Not on file  . Transportation needs:    Medical: Not on file    Non-medical: Not on file  Tobacco Use  . Smoking status: Never Smoker  . Smokeless tobacco: Never Used  Substance and Sexual Activity  . Alcohol use: Yes    Comment: weekly  . Drug use: No  . Sexual activity: Not on file    Lifestyle  . Physical activity:    Days per week: Not on file    Minutes per session: Not on file  . Stress: Not on file  Relationships  . Social connections:    Talks on phone: Not on file    Gets together: Not on file    Attends religious service: Not on file    Active member of club or organization: Not on file    Attends meetings of clubs or organizations: Not on file    Relationship status: Not on file  Other Topics Concern  . Not on file  Social History Narrative   Caffienated beverages-Yes   Seat belts often-Yes   Smoke alarm in home-Yes   Firearms/guns in home-Yes   History of physical abuse-NO   HLE- Masters degree   Allergies  Allergen Reactions  . Durezol [Difluprednate]     Eye drops-swelling   Family History  Problem Relation Age of Onset  . Diabetes Mother   . Hypertension Mother   . Arthritis Mother   . Arthritis Father   . Heart disease Father   . Diabetes Sister   . Cancer Neg Hx   . Stroke Neg Hx   . Kidney disease Neg Hx   . Early death Neg Hx  Current Outpatient Medications (Analgesics):  .  acetaminophen (TYLENOL) 500 MG tablet, Take 1,000 mg by mouth every 6 (six) hours as needed for mild pain. Marland Kitchen  allopurinol (ZYLOPRIM) 100 MG tablet, Take 2 tablets (200 mg total) by mouth daily.   Current Outpatient Medications (Other):  .  amitriptyline (ELAVIL) 10 MG tablet, TAKE 2 TABLETS (20 MG TOTAL) BY MOUTH AT BEDTIME. .  Diclofenac Sodium (PENNSAID) 2 % SOLN, Place 2 application onto the skin 2 (two) times daily. (Patient taking differently: Place 1 application onto the skin 2 (two) times daily. ) .  doxycycline (VIBRA-TABS) 100 MG tablet, Take 1 tablet (100 mg total) by mouth 2 (two) times daily. .  hydrOXYzine (ATARAX/VISTARIL) 10 MG tablet, TAKE 1 TABLET BY MOUTH THREE TIMES A DAY AS NEEDED .  Multiple Vitamins-Minerals (MULTIVITAMIN PO), Take 1 tablet by mouth at bedtime.  .  tizanidine (ZANAFLEX) 2 MG capsule, TAKE 1 CAPSULE (2 MG  TOTAL) BY MOUTH 3 (THREE) TIMES DAILY AS NEEDED FOR MUSCLE SPASMS. Marland Kitchen  tiZANidine (ZANAFLEX) 2 MG tablet, Take 2 mg by mouth 3 (three) times daily as needed for muscle spasms.    Past medical history, social, surgical and family history all reviewed in electronic medical record.  No pertanent information unless stated regarding to the chief complaint.   Review of Systems:  No headache, visual changes, nausea, vomiting, diarrhea, constipation, dizziness, abdominal pain, skin rash, fevers, chills, night sweats, weight loss, swollen lymph nodes, body aches, joint swelling,  chest pain, shortness of breath, mood changes.  Positive muscle aches  Objective  Blood pressure 130/90, pulse 68, height 5\' 9"  (1.753 m), weight 200 lb (90.7 kg), SpO2 95 %.    General: No apparent distress alert and oriented x3 mood and affect normal, dressed appropriately.  HEENT: Pupils equal, extraocular movements intact  Respiratory: Patient's speak in full sentences and does not appear short of breath  Cardiovascular: No lower extremity edema, non tender, no erythema  Skin: Warm dry intact with no signs of infection or rash on extremities or on axial skeleton.  Abdomen: Soft nontender  Neuro: Cranial nerves II through XII are intact, neurovascularly intact in all extremities with 2+ DTRs and 2+ pulses.  Lymph: No lymphadenopathy of posterior or anterior cervical chain or axillae bilaterally.  Gait normal with good balance and coordination.  MSK:  Non tender with full range of motion and good stability and symmetric strength and tone of shoulders, elbows, wrist, hip, knee and ankles bilaterally.  Back Exam:  Inspection: Loss of lordosis Motion: Flexion 45 deg, Extension 25 deg, Side Bending to 35 deg bilaterally,  Rotation to 35 deg bilaterally  SLR laying: Negative  XSLR laying: Negative  Palpable tenderness: Tender to palpation the paraspinal musculature.Marland Kitchen FABER: As bilaterally. Sensory change: Gross sensation  intact to all lumbar and sacral dermatomes.  Reflexes: 2+ at both patellar tendons, 2+ at achilles tendons, Babinski's downgoing.  Strength at foot  Plantar-flexion: 5/5 Dorsi-flexion: 5/5 Eversion: 5/5 Inversion: 5/5  Leg strength  Quad: 5/5 Hamstring: 5/5 Hip flexor: 5/5 Hip abductors: 5/5  Gait unremarkable.  Osteopathic findings C6 flexed rotated and side bent left T3 extended rotated and side bent right inhaled third rib T10 extended rotated and side bent left L4 flexed rotated and side bent left Sacrum right on right     Impression and Recommendations:     This case required medical decision making of moderate complexity. The above documentation has been reviewed and is accurate and complete Lyndal Pulley,  DO       Note: This dictation was prepared with Dragon dictation along with smaller phrase technology. Any transcriptional errors that result from this process are unintentional.

## 2018-03-19 ENCOUNTER — Ambulatory Visit: Payer: Medicare HMO | Admitting: Family Medicine

## 2018-03-19 ENCOUNTER — Encounter: Payer: Self-pay | Admitting: Family Medicine

## 2018-03-19 VITALS — BP 130/90 | HR 68 | Ht 69.0 in | Wt 200.0 lb

## 2018-03-19 DIAGNOSIS — M5137 Other intervertebral disc degeneration, lumbosacral region: Secondary | ICD-10-CM | POA: Diagnosis not present

## 2018-03-19 DIAGNOSIS — Z23 Encounter for immunization: Secondary | ICD-10-CM

## 2018-03-19 DIAGNOSIS — M999 Biomechanical lesion, unspecified: Secondary | ICD-10-CM | POA: Diagnosis not present

## 2018-03-19 DIAGNOSIS — M51379 Other intervertebral disc degeneration, lumbosacral region without mention of lumbar back pain or lower extremity pain: Secondary | ICD-10-CM

## 2018-03-19 NOTE — Patient Instructions (Signed)
Good to see you  Ice is your friend  Dennis Bast are doing amazing overall  Stay active and continue to work on the weight  See me again in 4-6 weeks

## 2018-03-19 NOTE — Assessment & Plan Note (Signed)
Decision today to treat with OMT was based on Physical Exam  After verbal consent patient was treated with HVLA, ME, FPR techniques in cervical, thoracic, rib,  lumbar and sacral areas  Patient tolerated the procedure well with improvement in symptoms  Patient given exercises, stretches and lifestyle modifications  See medications in patient instructions if given  Patient will follow up in 4-8 weeks 

## 2018-03-19 NOTE — Assessment & Plan Note (Signed)
Degenerative disc disease.  Discussed icing regimen and home exercise.  Discussed which activities to do which wants to avoid.  Discussed with patient about posture and ergonomics and core strengthening.  Follow-up again in 4 to 6 weeks

## 2018-03-31 ENCOUNTER — Other Ambulatory Visit: Payer: Self-pay | Admitting: Neurology

## 2018-04-29 NOTE — Progress Notes (Signed)
[name] [place], [place] [address] Phone: [phone] Subjective:    I Daniel [name] am serving as a Education administrator for [name].   CC: Back pain follow-up  [id number]  Daniel [name] is a 67 y.o. male coming in with complaint of back pain. States his back is a little sore this morning. Left knee is painful with stairs.   Onset-been going on a little more over the last week Location- "inside the the knee" patient has known knee arthritis but has been doing relatively well.  States that it is not severe enough for an injection Character- Achy       Past Medical History:  Diagnosis Date  . Acne   . Anxiety   . DDD (degenerative disc disease), lumbosacral    chronic back pain  . Diverticulosis of colon (without mention of hemorrhage)    colo 09/2010  . Low back pain radiating to both legs    Past Surgical History:  Procedure Laterality Date  . CATARACT EXTRACTION Left 07/14/2015  . CYST EXCISION Left 06/23/2015   Procedure: EXCISION MASS LEFT INDEX FINGER;  Surgeon: Daryll Brod, MD;  Location: [facility];  Service: Orthopedics;  Laterality: Left;  ANESTHESIA: IV REGIONAL/FAB  . HERNIA REPAIR    . REFRACTIVE SURGERY     left eye/ with enhancement  . TONSILLECTOMY     Social History   Socioeconomic History  . Marital status: Single    Spouse name: Not on file  . Number of children: Not on file  . Years of education: Not on file  . Highest education level: Not on file  Occupational History  . Not on file  Social Needs  . Financial resource strain: Not on file  . Food insecurity:    Worry: Not on file    Inability: Not on file  . Transportation needs:    Medical: Not on file    Non-medical: Not on file  Tobacco Use  . Smoking status: Never Smoker  . Smokeless tobacco: Never Used  Substance and Sexual Activity  . Alcohol use: Yes    Comment: weekly  . Drug use: No  . Sexual activity: Not on file    Lifestyle  . Physical activity:    Days per week: Not on file    Minutes per session: Not on file  . Stress: Not on file  Relationships  . Social connections:    Talks on phone: Not on file    Gets together: Not on file    Attends religious service: Not on file    Active member of club or organization: Not on file    Attends meetings of clubs or organizations: Not on file    Relationship status: Not on file  Other Topics Concern  . Not on file  Social History Narrative   Caffienated beverages-Yes   Seat belts often-Yes   Smoke alarm in home-Yes   Firearms/guns in home-Yes   History of physical abuse-NO   HLE- Masters degree   Allergies  Allergen Reactions  . Durezol [Difluprednate]     Eye drops-swelling   Family History  Problem Relation Age of Onset  . Diabetes Mother   . Hypertension Mother   . Arthritis Mother   . Arthritis Father   . Heart disease Father   . Diabetes Sister   . Cancer Neg Hx   . Stroke Neg Hx   . Kidney disease Neg Hx   .  Early death Neg Hx        Current Outpatient Medications (Analgesics):  .  acetaminophen (TYLENOL) 500 MG tablet, Take 1,000 mg by mouth every 6 (six) hours as needed for mild pain. Marland Kitchen  allopurinol (ZYLOPRIM) 100 MG tablet, Take 2 tablets (200 mg total) by mouth daily.   Current Outpatient Medications (Other):  .  amitriptyline (ELAVIL) 10 MG tablet, TAKE 2 TABLETS (20 MG TOTAL) BY MOUTH AT BEDTIME. .  Diclofenac Sodium (PENNSAID) 2 % SOLN, Place 2 application onto the skin 2 (two) times daily. (Patient taking differently: Place 1 application onto the skin 2 (two) times daily. ) .  doxycycline (VIBRA-TABS) 100 MG tablet, Take 1 tablet (100 mg total) by mouth 2 (two) times daily. .  hydrOXYzine (ATARAX/VISTARIL) 10 MG tablet, TAKE 1 TABLET BY MOUTH THREE TIMES A DAY AS NEEDED .  Multiple Vitamins-Minerals (MULTIVITAMIN PO), Take 1 tablet by mouth at bedtime.  .  tizanidine (ZANAFLEX) 2 MG capsule, Take 1 capsule (2 mg  total) by mouth at bedtime. Marland Kitchen  tiZANidine (ZANAFLEX) 2 MG tablet, Take 2 mg by mouth 3 (three) times daily as needed for muscle spasms.    Past medical history, social, surgical and family history all reviewed in electronic medical record.  No pertanent information unless stated regarding to the chief complaint.   Review of Systems:  No headache, visual changes, nausea, vomiting, diarrhea, constipation, dizziness, abdominal pain, skin rash, fevers, chills, night sweats, weight loss, swollen lymph nodes, body aches, joint swelling, chest pain, shortness of breath, mood changes.  Positive muscle aches  Objective  Blood pressure (!) 148/90, pulse 83, height 5\' 9"  (1.753 m), weight 197 lb (89.4 kg), SpO2 94 %.   General: No apparent distress alert and oriented x3 mood and affect normal, dressed appropriately.  HEENT: Pupils equal, extraocular movements intact  Respiratory: Patient's speak in full sentences and does not appear short of breath  Cardiovascular: No lower extremity edema, non tender, no erythema  Skin: Warm dry intact with no signs of infection or rash on extremities or on axial skeleton.  Abdomen: Soft nontender  Neuro: Cranial nerves II through XII are intact, neurovascularly intact in all extremities with 2+ DTRs and 2+ pulses.  Lymph: No lymphadenopathy of posterior or anterior cervical chain or axillae bilaterally.  Gait normal with good balance and coordination.  MSK:  Non tender with full range of motion and good stability and symmetric strength and tone of shoulders, elbows, wrist, hip, knee and ankles bilaterally.  Back Exam:  Inspection: Loss of lordosis Motion: Flexion 45 deg, Extension 25 deg, Side Bending to 35 deg bilaterally,  Rotation to 30 deg bilaterally  SLR laying: Negative  XSLR laying: Negative  Palpable tenderness: Moderate to severe tenderness over the sacroiliac joints bilaterally.Marland Kitchen FABER: Tightness bilaterally. Sensory change: Gross sensation intact to  all lumbar and sacral dermatomes.  Reflexes: 2+ at both patellar tendons, 2+ at achilles tendons, Babinski's downgoing.  Strength at foot  Plantar-flexion: 5/5 Dorsi-flexion: 5/5 Eversion: 5/5 Inversion: 5/5  Leg strength  Quad: 5/5 Hamstring: 5/5 Hip flexor: 5/5 Hip abductors: 5/5   Osteopathic findings C2 flexed rotated and side bent right C6 flexed rotated and side bent left T3 extended rotated and side bent right inhaled third rib T6 extended rotated and side bent left L2 flexed rotated and side bent right Sacrum right on right     Impression and Recommendations:     This case required medical decision making of moderate complexity. The above documentation  has been reviewed and is accurate and complete Cailen Mihalik M Sulma Ruffino, DO       Note: This dictation was prepared with Dragon dictation along with smaller phrase technology. Any transcriptional errors that result from this process are unintentional.         

## 2018-04-30 ENCOUNTER — Encounter: Payer: Self-pay | Admitting: Family Medicine

## 2018-04-30 ENCOUNTER — Ambulatory Visit: Payer: Medicare HMO | Admitting: Family Medicine

## 2018-04-30 VITALS — BP 148/90 | HR 83 | Ht 69.0 in | Wt 197.0 lb

## 2018-04-30 DIAGNOSIS — M999 Biomechanical lesion, unspecified: Secondary | ICD-10-CM

## 2018-04-30 DIAGNOSIS — M5137 Other intervertebral disc degeneration, lumbosacral region: Secondary | ICD-10-CM | POA: Diagnosis not present

## 2018-04-30 NOTE — Assessment & Plan Note (Signed)
Degenerative disc disease of lumbar spine.  Discussed icing regimen and home exercise.  Which activities to do which wants to avoid.  Patient is to increase activity as tolerated.  Follow-up again in 4 to 8 weeks.

## 2018-04-30 NOTE — Patient Instructions (Signed)
Good to see you  Daniel [name] is your friend Stay active Hope the winter goes well  See me again in 3-5 weeks

## 2018-04-30 NOTE — Assessment & Plan Note (Signed)
Decision today to treat with OMT was based on Physical Exam  After verbal consent patient was treated with HVLA, ME, FPR techniques in cervical, thoracic, rib lumbar and sacral areas  Patient tolerated the procedure well with improvement in symptoms  Patient given exercises, stretches and lifestyle modifications  See medications in patient instructions if given  Patient will follow up in 4-8 weeks 

## 2018-05-28 NOTE — Progress Notes (Signed)
[name] [place], [place] [address] Phone: [phone] Subjective:    I Daniel [name] am serving as a Education administrator for [name].   CC: Back pain follow-up  [id number]  Daniel [name] is a 68 y.o. male coming in with complaint of back pain. States his back is ok. Has been using ice for pain. Wants a refill of tizanidine.  Patient was moving a table and felt like that could have exacerbated some of the pain this time.  More tightness than usual, no radiation down the leg, no numbness or tingling.     Past Medical History:  Diagnosis Date  . Acne   . Anxiety   . DDD (degenerative disc disease), lumbosacral    chronic back pain  . Diverticulosis of colon (without mention of hemorrhage)    colo 09/2010  . Low back pain radiating to both legs    Past Surgical History:  Procedure Laterality Date  . CATARACT EXTRACTION Left 07/14/2015  . CYST EXCISION Left 06/23/2015   Procedure: EXCISION MASS LEFT INDEX FINGER;  Surgeon: Daryll Brod, MD;  Location: [facility];  Service: Orthopedics;  Laterality: Left;  ANESTHESIA: IV REGIONAL/FAB  . HERNIA REPAIR    . REFRACTIVE SURGERY     left eye/ with enhancement  . TONSILLECTOMY     Social History   Socioeconomic History  . Marital status: Single    Spouse name: Not on file  . Number of children: Not on file  . Years of education: Not on file  . Highest education level: Not on file  Occupational History  . Not on file  Social Needs  . Financial resource strain: Not on file  . Food insecurity:    Worry: Not on file    Inability: Not on file  . Transportation needs:    Medical: Not on file    Non-medical: Not on file  Tobacco Use  . Smoking status: Never Smoker  . Smokeless tobacco: Never Used  Substance and Sexual Activity  . Alcohol use: Yes    Comment: weekly  . Drug use: No  . Sexual activity: Not on file  Lifestyle  . Physical activity:    Days per week:  Not on file    Minutes per session: Not on file  . Stress: Not on file  Relationships  . Social connections:    Talks on phone: Not on file    Gets together: Not on file    Attends religious service: Not on file    Active member of club or organization: Not on file    Attends meetings of clubs or organizations: Not on file    Relationship status: Not on file  Other Topics Concern  . Not on file  Social History Narrative   Caffienated beverages-Yes   Seat belts often-Yes   Smoke alarm in home-Yes   Firearms/guns in home-Yes   History of physical abuse-NO   HLE- Masters degree   Allergies  Allergen Reactions  . Durezol [Difluprednate]     Eye drops-swelling   Family History  Problem Relation Age of Onset  . Diabetes Mother   . Hypertension Mother   . Arthritis Mother   . Arthritis Father   . Heart disease Father   . Diabetes Sister   . Cancer Neg Hx   . Stroke Neg Hx   . Kidney disease Neg Hx   . Early death Neg Hx  Current Outpatient Medications (Analgesics):  .  acetaminophen (TYLENOL) 500 MG tablet, Take 1,000 mg by mouth every 6 (six) hours as needed for mild pain. Marland Kitchen  allopurinol (ZYLOPRIM) 100 MG tablet, Take 2 tablets (200 mg total) by mouth daily.   Current Outpatient Medications (Other):  .  amitriptyline (ELAVIL) 10 MG tablet, TAKE 2 TABLETS (20 MG TOTAL) BY MOUTH AT BEDTIME. .  Diclofenac Sodium (PENNSAID) 2 % SOLN, Place 2 application onto the skin 2 (two) times daily. (Patient taking differently: Place 1 application onto the skin 2 (two) times daily. ) .  doxycycline (VIBRA-TABS) 100 MG tablet, Take 1 tablet (100 mg total) by mouth 2 (two) times daily. .  hydrOXYzine (ATARAX/VISTARIL) 10 MG tablet, TAKE 1 TABLET BY MOUTH THREE TIMES A DAY AS NEEDED .  Multiple Vitamins-Minerals (MULTIVITAMIN PO), Take 1 tablet by mouth at bedtime.  Marland Kitchen  tiZANidine (ZANAFLEX) 2 MG tablet, Take 1 tablet (2 mg total) by mouth every 8 (eight) hours as needed for muscle  spasms. .  traZODone (DESYREL) 50 MG tablet, Take 0.5-1 tablets (25-50 mg total) by mouth at bedtime as needed for sleep.    Past medical history, social, surgical and family history all reviewed in electronic medical record.  No pertanent information unless stated regarding to the chief complaint.   Review of Systems:  No headache, visual changes, nausea, vomiting, diarrhea, constipation, dizziness, abdominal pain, skin rash, fevers, chills, night sweats, weight loss, swollen lymph nodes, body aches, joint swelling, muscle aches, chest pain, shortness of breath, mood changes.  Positive muscle aches  Objective  Blood pressure 140/80, pulse (!) 58, height 5\' 9"  (1.753 m), weight 199 lb (90.3 kg), SpO2 98 %.     General: No apparent distress alert and oriented x3 mood and affect normal, dressed appropriately.  HEENT: Pupils equal, extraocular movements intact  Respiratory: Patient's speak in full sentences and does not appear short of breath  Cardiovascular: No lower extremity edema, non tender, no erythema  Skin: Warm dry intact with no signs of infection or rash on extremities or on axial skeleton.  Abdomen: Soft nontender  Neuro: Cranial nerves II through XII are intact, neurovascularly intact in all extremities with 2+ DTRs and 2+ pulses.  Lymph: No lymphadenopathy of posterior or anterior cervical chain or axillae bilaterally.  Gait normal with good balance and coordination.  MSK:  tender with full range of motion and good stability and symmetric strength and tone of shoulders, elbows, wrist, hip, knee and ankles bilaterally.  Mild arthritic changes noted of multiple joints.  Back Exam:  Inspection: Unremarkable  Motion: Flexion 35 deg, Extension 25 deg, Side Bending to 25 deg bilaterally,  Rotation to 20 deg bilaterally  SLR laying: Negative  XSLR laying: Negative  Palpable tenderness: Patient is some tenderness to palpation of the paraspinal musculature lumbar spine more in the  thoracolumbar juncture and some over the right sacroiliac joint. FABER: negative. Sensory change: Gross sensation intact to all lumbar and sacral dermatomes.  Reflexes: 2+ at both patellar tendons, 2+ at achilles tendons, Babinski's downgoing.  Strength at foot  Plantar-flexion: 5/5 Dorsi-flexion: 5/5 Eversion: 5/5 Inversion: 5/5  Leg strength  Quad: 5/5 Hamstring: 5/5 Hip flexor: 5/5 Hip abductors: 5/5    Osteopathic findings C2 flexed rotated and side bent right C7 flexed rotated and side bent left T3 extended rotated and side bent right inhaled third rib T7 extended rotated and side bent left L2 flexed rotated and side bent left  Sacrum right on right  Impression and Recommendations:     This case required medical decision making of moderate complexity. The above documentation has been reviewed and is accurate and complete Lyndal Pulley, DO       Note: This dictation was prepared with Dragon dictation along with smaller phrase technology. Any transcriptional errors that result from this process are unintentional.

## 2018-05-29 ENCOUNTER — Ambulatory Visit (INDEPENDENT_AMBULATORY_CARE_PROVIDER_SITE_OTHER): Payer: Medicare Other | Admitting: Family Medicine

## 2018-05-29 ENCOUNTER — Encounter: Payer: Self-pay | Admitting: Family Medicine

## 2018-05-29 VITALS — BP 140/80 | HR 58 | Ht 69.0 in | Wt 199.0 lb

## 2018-05-29 DIAGNOSIS — M5137 Other intervertebral disc degeneration, lumbosacral region: Secondary | ICD-10-CM

## 2018-05-29 DIAGNOSIS — M999 Biomechanical lesion, unspecified: Secondary | ICD-10-CM | POA: Diagnosis not present

## 2018-05-29 MED ORDER — TRAZODONE HCL 50 MG PO TABS
25.0000 mg | ORAL_TABLET | Freq: Every evening | ORAL | 3 refills | Status: DC | PRN
Start: 2018-05-29 — End: 2018-06-21

## 2018-05-29 MED ORDER — TIZANIDINE HCL 2 MG PO TABS: 2.0000 mg | ORAL_TABLET | Freq: Three times a day (TID) | ORAL | 3 refills | Status: DC | PRN

## 2018-05-29 NOTE — Assessment & Plan Note (Signed)
Decision today to treat with OMT was based on Physical Exam  After verbal consent patient was treated with HVLA, ME, FPR techniques in cervical, thoracic, rib,  lumbar and sacral areas  Patient tolerated the procedure well with improvement in symptoms  Patient given exercises, stretches and lifestyle modifications  See medications in patient instructions if given  Patient will follow up in 4-8 weeks 

## 2018-05-29 NOTE — Assessment & Plan Note (Signed)
Known degenerative disc disease.  Discussed icing regimen and home exercise.  Discussed which activities to do which wants to avoid.  Increase activity slowly over the course the next several days.  Follow-up again in 4 to 8 weeks.

## 2018-05-29 NOTE — Patient Instructions (Addendum)
Good to see you  Ice is your friend Enjoy ATL Trazadone at night if needed See me again in 4-6 weeks

## 2018-06-21 ENCOUNTER — Other Ambulatory Visit: Payer: Self-pay | Admitting: Family Medicine

## 2018-06-25 NOTE — Progress Notes (Signed)
[name] Daniel [name], [place] [address] Phone: [phone] Subjective:    I Daniel [name] am serving as a Education administrator for [name].   I'm seeing this patient by the request  of:    CC: Neck pain, knee pain, elbow pain  IRC:[id number]     Updated 06/26/2018  Daniel [name] is a 68 y.o. male coming in with complaint of back pain. States that he is having trouble sleeping. Has to sleep in a chair. States that he has mouth and throat dryness. Believes it may have something to do with the medications he is taking. Right elbow pain. Weakness. States the occiput has always been an issue. Gets headaches.   Onset- 2 weeks  Location- lateral epi Duration-  Character-  Aggravating factors- turning a key, carrying things  Reliving factors-  Therapies tried-  Severity-with the elbow 7 out of 10.  Has been unfortunately helping his father change positions as well which seems to be causing more pain.  Patient does have his father on hospice now.     Past Medical History:  Diagnosis Date  . Acne   . Anxiety   . DDD (degenerative disc disease), lumbosacral    chronic back pain  . Diverticulosis of colon (without mention of hemorrhage)    colo 09/2010  . Low back pain radiating to both legs    Past Surgical History:  Procedure Laterality Date  . CATARACT EXTRACTION Left 07/14/2015  . CYST EXCISION Left 06/23/2015   Procedure: EXCISION MASS LEFT INDEX FINGER;  Surgeon: Daryll Brod, MD;  Location: [facility];  Service: Orthopedics;  Laterality: Left;  ANESTHESIA: IV REGIONAL/FAB  . HERNIA REPAIR    . REFRACTIVE SURGERY     left eye/ with enhancement  . TONSILLECTOMY     Social History   Socioeconomic History  . Marital status: Single    Spouse name: Not on file  . Number of children: Not on file  . Years of education: Not on file  . Highest education level: Not on file  Occupational History  . Not on file  Social Needs  .  Financial resource strain: Not on file  . Food insecurity:    Worry: Not on file    Inability: Not on file  . Transportation needs:    Medical: Not on file    Non-medical: Not on file  Tobacco Use  . Smoking status: Never Smoker  . Smokeless tobacco: Never Used  Substance and Sexual Activity  . Alcohol use: Yes    Comment: weekly  . Drug use: No  . Sexual activity: Not on file  Lifestyle  . Physical activity:    Days per week: Not on file    Minutes per session: Not on file  . Stress: Not on file  Relationships  . Social connections:    Talks on phone: Not on file    Gets together: Not on file    Attends religious service: Not on file    Active member of club or organization: Not on file    Attends meetings of clubs or organizations: Not on file    Relationship status: Not on file  Other Topics Concern  . Not on file  Social History Narrative   Caffienated beverages-Yes   Seat belts often-Yes   Smoke alarm in home-Yes   Firearms/guns in home-Yes   History of physical abuse-NO   HLE- Masters degree   Allergies  Allergen Reactions  .  Durezol [Difluprednate]     Eye drops-swelling   Family History  Problem Relation Age of Onset  . Diabetes Mother   . Hypertension Mother   . Arthritis Mother   . Arthritis Father   . Heart disease Father   . Diabetes Sister   . Cancer Neg Hx   . Stroke Neg Hx   . Kidney disease Neg Hx   . Early death Neg Hx        Current Outpatient Medications (Analgesics):  .  acetaminophen (TYLENOL) 500 MG tablet, Take 1,000 mg by mouth every 6 (six) hours as needed for mild pain. Marland Kitchen  allopurinol (ZYLOPRIM) 100 MG tablet, Take 2 tablets (200 mg total) by mouth daily.   Current Outpatient Medications (Other):  .  amitriptyline (ELAVIL) 10 MG tablet, TAKE 2 TABLETS (20 MG TOTAL) BY MOUTH AT BEDTIME. .  Diclofenac Sodium (PENNSAID) 2 % SOLN, Place 2 application onto the skin 2 (two) times daily. (Patient taking differently: Place 1  application onto the skin 2 (two) times daily. ) .  doxycycline (VIBRA-TABS) 100 MG tablet, Take 1 tablet (100 mg total) by mouth 2 (two) times daily. .  hydrOXYzine (ATARAX/VISTARIL) 10 MG tablet, TAKE 1 TABLET BY MOUTH THREE TIMES A DAY AS NEEDED .  Multiple Vitamins-Minerals (MULTIVITAMIN PO), Take 1 tablet by mouth at bedtime.  Marland Kitchen  tiZANidine (ZANAFLEX) 2 MG tablet, Take 1 tablet (2 mg total) by mouth every 8 (eight) hours as needed for muscle spasms. .  traZODone (DESYREL) 50 MG tablet, TAKE 1/2 OR 1 TABLET BY MOUTH AT BEDTIME AS NEEEDED FOR SLEEP .  Vitamin D, Ergocalciferol, (DRISDOL) 1.25 MG (50000 UT) CAPS capsule, Take 1 capsule (50,000 Units total) by mouth every 7 (seven) days.    Past medical history, social, surgical and family history all reviewed in electronic medical record.  No pertanent information unless stated regarding to the chief complaint.   Review of Systems:  No headache, visual changes, nausea, vomiting, diarrhea, constipation, dizziness, abdominal pain, skin rash, fevers, chills, night sweats, weight loss, swollen lymph nodes, , chest pain, shortness of breath, mood changes.  Positive muscle aches, body aches  Objective  Blood pressure 136/86, pulse 68, height 5\' 9"  (1.753 m), weight 198 lb (89.8 kg), SpO2 98 %.    General: No apparent distress alert and oriented x3 mood and affect normal, dressed appropriately.  HEENT: Pupils equal, extraocular movements intact  Respiratory: Patient's speak in full sentences and does not appear short of breath  Cardiovascular: No lower extremity edema, non tender, no erythema  Skin: Warm dry intact with no signs of infection or rash on extremities or on axial skeleton.  Abdomen: Soft nontender  Neuro: Cranial nerves II through XII are intact, neurovascularly intact in all extremities with 2+ DTRs and 2+ pulses.  Lymph: No lymphadenopathy of posterior or anterior cervical chain or axillae bilaterally.  Gait normal with good  balance and coordination.  MSK:  Non tender with full range of motion and good stability and symmetric strength and tone of shoulders,  wrist, hip, knee and ankles bilaterally.  Back Exam:  Inspection: Loss of lordosis Motion: Flexion 45 deg, Extension 25 deg, Side Bending to 35 deg bilaterally,  Rotation to 35 deg bilaterally  SLR laying: Positive radicular symptoms XSLR laying: Negative  Palpable tenderness: Tender to palpation diffusely in the lower back.. FABER: negative. Sensory change: Gross sensation intact to all lumbar and sacral dermatomes.  Reflexes: 2+ at both patellar tendons, 2+ at achilles tendons,  Babinski's downgoing.  Strength at foot  Plantar-flexion: 5/5 Dorsi-flexion: 5/5 Eversion: 5/5 Inversion: 5/5  Leg strength  Quad: 5/5 Hamstring: 5/5 Hip flexor: 5/5 Hip abductors: 5/5  Gait unremarkable.  Right elbow exam shows the patient has severe tenderness over the lateral epicondylar region.  Severe pain with resisted wrist extension.  Grip strength is intact.  Full range of motion of the elbow noted.  Limited musculoskeletal ultrasound was performed and interpreted by Lyndal Pulley  Limited ultrasound shows that patient does have partial tear noted on the articular side of the lateral epicondylar and common extensor tendon.  Possible small effusion noted on that side as well. Impression: Severe lateral epicondylitis with common extensor tendon tear  Osteopathic findings C6 flexed rotated and side bent left T3 extended rotated and side bent right inhaled third rib T7 extended rotated and side bent left L4 flexed rotated and side bent left Sacrum right on right   97110; 15 additional minutes spent for Therapeutic exercises as stated in above notes.  This included exercises focusing on stretching, strengthening, with significant focus on eccentric aspects.   Long term goals include an improvement in range of motion, strength, endurance as well as avoiding reinjury.  Patient's frequency would include in 1-2 times a day, 3-5 times a week for a duration of 6-12 weeks. Lateral Epicondylitis: Elbow anatomy was reviewed, and tendinopathy was explained.  Pt. given a formal rehab program. Series of concentric and eccentric exercises should be done starting with no weight, work up to 1 lb, hammer, etc.  Use counterforce strap if working or using hands.  Formal PT would be beneficial. Emphasized stretching an cross-friction massage Emphasized proper palms up lifting biomechanics to unload ECRB   Proper technique shown and discussed handout in great detail with ATC.  All questions were discussed and answered.     Impression and Recommendations:     This case required medical decision making of moderate complexity. The above documentation has been reviewed and is accurate and complete Lyndal Pulley, DO       Note: This dictation was prepared with Dragon dictation along with smaller phrase technology. Any transcriptional errors that result from this process are unintentional.

## 2018-06-26 ENCOUNTER — Encounter: Payer: Self-pay | Admitting: Family Medicine

## 2018-06-26 ENCOUNTER — Ambulatory Visit (INDEPENDENT_AMBULATORY_CARE_PROVIDER_SITE_OTHER): Payer: Medicare Other | Admitting: Family Medicine

## 2018-06-26 ENCOUNTER — Ambulatory Visit: Payer: Self-pay

## 2018-06-26 VITALS — BP 136/86 | HR 68 | Ht 69.0 in | Wt 198.0 lb

## 2018-06-26 DIAGNOSIS — M999 Biomechanical lesion, unspecified: Secondary | ICD-10-CM

## 2018-06-26 DIAGNOSIS — M25521 Pain in right elbow: Secondary | ICD-10-CM

## 2018-06-26 DIAGNOSIS — M7711 Lateral epicondylitis, right elbow: Secondary | ICD-10-CM | POA: Diagnosis not present

## 2018-06-26 MED ORDER — KETOROLAC TROMETHAMINE 60 MG/2ML IM SOLN
60.0000 mg | Freq: Once | INTRAMUSCULAR | Status: AC
  Administered 2018-06-26: 60 mg via INTRAMUSCULAR

## 2018-06-26 MED ORDER — VITAMIN D (ERGOCALCIFEROL) 1.25 MG (50000 UNIT) PO CAPS: 50000.0000 [IU] | ORAL_CAPSULE | ORAL | 0 refills | Status: DC

## 2018-06-26 MED ORDER — METHYLPREDNISOLONE ACETATE 80 MG/ML IJ SUSP
80.0000 mg | Freq: Once | INTRAMUSCULAR | Status: AC
  Administered 2018-06-26: 80 mg via INTRAMUSCULAR

## 2018-06-26 NOTE — Patient Instructions (Signed)
Good to see you  Ice is your friend  2 injections today  No lifting overhand Wear the wrist brace day and night for 1 week then nightly for 2 weeks  No heavy lifting.  Colchicine 2 times daily for 3 days  Once weekly vitamin d for 12 weeks See me again in 3 weeks

## 2018-06-26 NOTE — Assessment & Plan Note (Signed)
Right lateral epicondylitis.  Discussed icing regimen and home exercise.  Discussed which activities to do which wants to avoid.  Increase activity slowly over the course the next several days.  Patient is to avoid wrist extension.  Wrist brace given.  Follow-up again in 4 to 8 weeks

## 2018-06-26 NOTE — Assessment & Plan Note (Signed)
Decision today to treat with OMT was based on Physical Exam  After verbal consent patient was treated with HVLA, ME, FPR techniques in cervical, thoracic, rib lumbar and sacral areas  Patient tolerated the procedure well with improvement in symptoms  Patient given exercises, stretches and lifestyle modifications  See medications in patient instructions if given  Patient will follow up in 4-8 weeks 

## 2018-07-16 NOTE — Progress Notes (Signed)
[name] [place], [place] [address] Phone: [phone] Subjective:     CC: Back pain follow-up  [id number]   06/26/2018: Right lateral epicondylitis.  Discussed icing regimen and home exercise.  Discussed which activities to do which wants to avoid.  Increase activity slowly over the course the next several days.  Patient is to avoid wrist extension.  Wrist brace given.  Follow-up again in 4 to 8 weeks  Update 07/17/2018: Amire Leazer is a 68 y.o. male coming in with complaint of right elbow pain. Patient states that his arm is doing better but is still painful with moving dishes from dishwasher to cupboard.  Patient states that back is doing relatively well as well.  Known degenerative disc disease.  No radicular symptoms.  Some mild tightness and increased stress.  Patient's father is in hospice now.      Past Medical History:  Diagnosis Date  . Acne   . Anxiety   . DDD (degenerative disc disease), lumbosacral    chronic back pain  . Diverticulosis of colon (without mention of hemorrhage)    colo 09/2010  . Low back pain radiating to both legs    Past Surgical History:  Procedure Laterality Date  . CATARACT EXTRACTION Left 07/14/2015  . CYST EXCISION Left 06/23/2015   Procedure: EXCISION MASS LEFT INDEX FINGER;  Surgeon: Daryll Brod, MD;  Location: [facility];  Service: Orthopedics;  Laterality: Left;  ANESTHESIA: IV REGIONAL/FAB  . HERNIA REPAIR    . REFRACTIVE SURGERY     left eye/ with enhancement  . TONSILLECTOMY     Social History   Socioeconomic History  . Marital status: Single    Spouse name: Not on file  . Number of children: Not on file  . Years of education: Not on file  . Highest education level: Not on file  Occupational History  . Not on file  Social Needs  . Financial resource strain: Not on file  . Food insecurity:    Worry: Not on file    Inability: Not on file  . Transportation needs:      Medical: Not on file    Non-medical: Not on file  Tobacco Use  . Smoking status: Never Smoker  . Smokeless tobacco: Never Used  Substance and Sexual Activity  . Alcohol use: Yes    Comment: weekly  . Drug use: No  . Sexual activity: Not on file  Lifestyle  . Physical activity:    Days per week: Not on file    Minutes per session: Not on file  . Stress: Not on file  Relationships  . Social connections:    Talks on phone: Not on file    Gets together: Not on file    Attends religious service: Not on file    Active member of club or organization: Not on file    Attends meetings of clubs or organizations: Not on file    Relationship status: Not on file  Other Topics Concern  . Not on file  Social History Narrative   Caffienated beverages-Yes   Seat belts often-Yes   Smoke alarm in home-Yes   Firearms/guns in home-Yes   History of physical abuse-NO   HLE- Masters degree   Allergies  Allergen Reactions  . Durezol [Difluprednate]     Eye drops-swelling   Family History  Problem Relation Age of Onset  . Diabetes Mother   . Hypertension Mother   .  Arthritis Mother   . Arthritis Father   . Heart disease Father   . Diabetes Sister   . Cancer Neg Hx   . Stroke Neg Hx   . Kidney disease Neg Hx   . Early death Neg Hx        Current Outpatient Medications (Analgesics):  .  acetaminophen (TYLENOL) 500 MG tablet, Take 1,000 mg by mouth every 6 (six) hours as needed for mild pain. Marland Kitchen  allopurinol (ZYLOPRIM) 100 MG tablet, Take 2 tablets (200 mg total) by mouth daily.   Current Outpatient Medications (Other):  .  amitriptyline (ELAVIL) 10 MG tablet, TAKE 2 TABLETS (20 MG TOTAL) BY MOUTH AT BEDTIME. .  Diclofenac Sodium (PENNSAID) 2 % SOLN, Place 2 application onto the skin 2 (two) times daily. (Patient taking differently: Place 1 application onto the skin 2 (two) times daily. ) .  doxycycline (VIBRA-TABS) 100 MG tablet, Take 1 tablet (100 mg total) by mouth 2 (two)  times daily. .  hydrOXYzine (ATARAX/VISTARIL) 10 MG tablet, TAKE 1 TABLET BY MOUTH THREE TIMES A DAY AS NEEDED .  Multiple Vitamins-Minerals (MULTIVITAMIN PO), Take 1 tablet by mouth at bedtime.  Marland Kitchen  tiZANidine (ZANAFLEX) 2 MG tablet, Take 1 tablet (2 mg total) by mouth every 8 (eight) hours as needed for muscle spasms. .  traZODone (DESYREL) 50 MG tablet, TAKE 1/2 OR 1 TABLET BY MOUTH AT BEDTIME AS NEEEDED FOR SLEEP .  Vitamin D, Ergocalciferol, (DRISDOL) 1.25 MG (50000 UT) CAPS capsule, Take 1 capsule (50,000 Units total) by mouth every 7 (seven) days.    Past medical history, social, surgical and family history all reviewed in electronic medical record.  No pertanent information unless stated regarding to the chief complaint.   Review of Systems:  No headache, visual changes, nausea, vomiting, diarrhea, constipation, dizziness, abdominal pain, skin rash, fevers, chills, night sweats, weight loss, swollen lymph nodes, body aches, joint swelling, muscle aches, chest pain, shortness of breath, mood changes.   Objective  Blood pressure (!) 118/92, pulse (!) 58, height 5\' 9"  (1.753 m), weight 198 lb (89.8 kg), SpO2 98 %.    General: No apparent distress alert and oriented x3 mood and affect normal, dressed appropriately.  HEENT: Pupils equal, extraocular movements intact  Respiratory: Patient's speak in full sentences and does not appear short of breath  Cardiovascular: No lower extremity edema, non tender, no erythema  Skin: Warm dry intact with no signs of infection or rash on extremities or on axial skeleton.  Abdomen: Soft nontender  Neuro: Cranial nerves II through XII are intact, neurovascularly intact in all extremities with 2+ DTRs and 2+ pulses.  Lymph: No lymphadenopathy of posterior or anterior cervical chain or axillae bilaterally.  Gait normal with good balance and coordination.  MSK:  Non tender with full range of motion and good stability and symmetric strength and tone of  shoulders, elbows, wrist, hip, knee and ankles bilaterally.  Back Exam:  Inspection: Unremarkable  Motion: Flexion 45 deg, Extension 25 deg, Side Bending to 30 deg bilaterally,  Rotation to 35 deg bilaterally  SLR laying: Negative  XSLR laying: Negative  Palpable tenderness: To palpation of the paraspinal musculature lumbar spine more in the lumbosacral area right greater than left. FABER: Tightness on the right. Sensory change: Gross sensation intact to all lumbar and sacral dermatomes.  Reflexes: 2+ at both patellar tendons, 2+ at achilles tendons, Babinski's downgoing.  Strength at foot  Plus out of 5 strength bilaterally  Osteopathic findingsl C2 flexed  rotated and side bent right C6 flexed rotated and side bent left T3 extended rotated and side bent right inhaled third rib T9 extended rotated and side bent left L2 flexed rotated and side bent right Sacrum right on right    Impression and Recommendations:     This case required medical decision making of moderate complexity. The above documentation has been reviewed and is accurate and complete Lyndal Pulley, DO       Note: This dictation was prepared with Dragon dictation along with smaller phrase technology. Any transcriptional errors that result from this process are unintentional.

## 2018-07-17 ENCOUNTER — Ambulatory Visit (INDEPENDENT_AMBULATORY_CARE_PROVIDER_SITE_OTHER): Payer: Medicare Other | Admitting: Family Medicine

## 2018-07-17 ENCOUNTER — Encounter: Payer: Self-pay | Admitting: Family Medicine

## 2018-07-17 VITALS — BP 118/92 | HR 58 | Ht 69.0 in | Wt 198.0 lb

## 2018-07-17 DIAGNOSIS — M999 Biomechanical lesion, unspecified: Secondary | ICD-10-CM

## 2018-07-17 DIAGNOSIS — M5137 Other intervertebral disc degeneration, lumbosacral region: Secondary | ICD-10-CM | POA: Diagnosis not present

## 2018-07-17 NOTE — Patient Instructions (Signed)
Great to see you  Daniel [name] is your friend DHEA 50 mg daily for 4 weeks I hope everything goes well  See me again in 4-5 weeks

## 2018-07-17 NOTE — Assessment & Plan Note (Addendum)
Decision today to treat with OMT was based on Physical Exam  After verbal consent patient was treated with HVLA, ME, FPR techniques in cervical, thoracic, rib,  lumbar and sacral areas  Patient tolerated the procedure well with improvement in symptoms  Patient given exercises, stretches and lifestyle modifications  See medications in patient instructions if given  Patient will follow up in 4-8 weeks 

## 2018-07-17 NOTE — Assessment & Plan Note (Signed)
Mild increase in tightness.  Patient has lost weight and encouraged him to continue to do some.  Discussed over-the-counter natural supplements and different changes he can do during the day to make this more beneficial.  We discussed icing regimen and home exercises.  We discussed which activities to potentially avoided even with nutritional changes.  Patient responded well to manipulation.  Follow-up again in 4 weeks

## 2018-08-14 ENCOUNTER — Ambulatory Visit: Payer: Medicare Other | Admitting: Family Medicine

## 2018-08-14 ENCOUNTER — Other Ambulatory Visit: Payer: Self-pay | Admitting: Neurology

## 2018-08-14 NOTE — Progress Notes (Deleted)
[name] [address] [place], [place] [address] Phone: [phone] Subjective:    I'm seeing this patient by the request  of:    CC:   [id number]  Daniel [name] is a 68 y.o. male coming in with complaint of ***  Onset-  Location Duration-  Character- Aggravating factors- Reliving factors-  Therapies tried-  Severity-     Past Medical History:  Diagnosis Date  . Acne   . Anxiety   . DDD (degenerative disc disease), lumbosacral    chronic back pain  . Diverticulosis of colon (without mention of hemorrhage)    colo 09/2010  . Low back pain radiating to both legs    Past Surgical History:  Procedure Laterality Date  . CATARACT EXTRACTION Left 07/14/2015  . CYST EXCISION Left 06/23/2015   Procedure: EXCISION MASS LEFT INDEX FINGER;  Surgeon: Daryll Brod, MD;  Location: [facility];  Service: Orthopedics;  Laterality: Left;  ANESTHESIA: IV REGIONAL/FAB  . HERNIA REPAIR    . REFRACTIVE SURGERY     left eye/ with enhancement  . TONSILLECTOMY     Social History   Socioeconomic History  . Marital status: Single    Spouse name: Not on file  . Number of children: Not on file  . Years of education: Not on file  . Highest education level: Not on file  Occupational History  . Not on file  Social Needs  . Financial resource strain: Not on file  . Food insecurity:    Worry: Not on file    Inability: Not on file  . Transportation needs:    Medical: Not on file    Non-medical: Not on file  Tobacco Use  . Smoking status: Never Smoker  . Smokeless tobacco: Never Used  Substance and Sexual Activity  . Alcohol use: Yes    Comment: weekly  . Drug use: No  . Sexual activity: Not on file  Lifestyle  . Physical activity:    Days per week: Not on file    Minutes per session: Not on file  . Stress: Not on file  Relationships  . Social connections:    Talks on phone: Not on file    Gets together: Not on file    Attends  religious service: Not on file    Active member of club or organization: Not on file    Attends meetings of clubs or organizations: Not on file    Relationship status: Not on file  Other Topics Concern  . Not on file  Social History Narrative   Caffienated beverages-Yes   Seat belts often-Yes   Smoke alarm in home-Yes   Firearms/guns in home-Yes   History of physical abuse-NO   HLE- Masters degree   Allergies  Allergen Reactions  . Durezol [Difluprednate]     Eye drops-swelling   Family History  Problem Relation Age of Onset  . Diabetes Mother   . Hypertension Mother   . Arthritis Mother   . Arthritis Father   . Heart disease Father   . Diabetes Sister   . Cancer Neg Hx   . Stroke Neg Hx   . Kidney disease Neg Hx   . Early death Neg Hx        Current Outpatient Medications (Analgesics):  .  acetaminophen (TYLENOL) 500 MG tablet, Take 1,000 mg by mouth every 6 (six) hours as needed for mild pain. Marland Kitchen  allopurinol (ZYLOPRIM) 100 MG tablet, Take 2 tablets (  200 mg total) by mouth daily.   Current Outpatient Medications (Other):  .  amitriptyline (ELAVIL) 10 MG tablet, TAKE 2 TABLETS (20 MG TOTAL) BY MOUTH AT BEDTIME. .  Diclofenac Sodium (PENNSAID) 2 % SOLN, Place 2 application onto the skin 2 (two) times daily. (Patient taking differently: Place 1 application onto the skin 2 (two) times daily. ) .  doxycycline (VIBRA-TABS) 100 MG tablet, Take 1 tablet (100 mg total) by mouth 2 (two) times daily. .  hydrOXYzine (ATARAX/VISTARIL) 10 MG tablet, TAKE 1 TABLET BY MOUTH THREE TIMES A DAY AS NEEDED .  Multiple Vitamins-Minerals (MULTIVITAMIN PO), Take 1 tablet by mouth at bedtime.  Marland Kitchen  tiZANidine (ZANAFLEX) 2 MG tablet, Take 1 tablet (2 mg total) by mouth every 8 (eight) hours as needed for muscle spasms. .  traZODone (DESYREL) 50 MG tablet, TAKE 1/2 OR 1 TABLET BY MOUTH AT BEDTIME AS NEEEDED FOR SLEEP .  Vitamin D, Ergocalciferol, (DRISDOL) 1.25 MG (50000 UT) CAPS capsule, Take 1  capsule (50,000 Units total) by mouth every 7 (seven) days.    Past medical history, social, surgical and family history all reviewed in electronic medical record.  No pertanent information unless stated regarding to the chief complaint.   Review of Systems:  No headache, visual changes, nausea, vomiting, diarrhea, constipation, dizziness, abdominal pain, skin rash, fevers, chills, night sweats, weight loss, swollen lymph nodes, body aches, joint swelling, muscle aches, chest pain, shortness of breath, mood changes.   Objective  There were no vitals taken for this visit. Systems examined below as of    General: No apparent distress alert and oriented x3 mood and affect normal, dressed appropriately.  HEENT: Pupils equal, extraocular movements intact  Respiratory: Patient's speak in full sentences and does not appear short of breath  Cardiovascular: No lower extremity edema, non tender, no erythema  Skin: Warm dry intact with no signs of infection or rash on extremities or on axial skeleton.  Abdomen: Soft nontender  Neuro: Cranial nerves II through XII are intact, neurovascularly intact in all extremities with 2+ DTRs and 2+ pulses.  Lymph: No lymphadenopathy of posterior or anterior cervical chain or axillae bilaterally.  Gait normal with good balance and coordination.  MSK:  Non tender with full range of motion and good stability and symmetric strength and tone of shoulders, elbows, wrist, hip, knee and ankles bilaterally.     Impression and Recommendations:     This case required medical decision making of moderate complexity. The above documentation has been reviewed and is accurate and complete Lyndal Pulley, DO       Note: This dictation was prepared with Dragon dictation along with smaller phrase technology. Any transcriptional errors that result from this process are unintentional.

## 2018-09-05 ENCOUNTER — Other Ambulatory Visit: Payer: Self-pay | Admitting: Neurology

## 2018-09-12 ENCOUNTER — Other Ambulatory Visit: Payer: Self-pay | Admitting: Family Medicine

## 2018-10-19 NOTE — Progress Notes (Signed)
Virtual Visit via Video Note The purpose of this virtual visit is to provide medical care while limiting exposure to the novel coronavirus.    Consent was obtained for video visit:  Yes Answered questions that patient had about telehealth interaction:  Yes I discussed the limitations, risks, security and privacy concerns of performing an evaluation and management service by telemedicine. I also discussed with the patient that there may be a patient responsible charge related to this service. The patient expressed understanding and agreed to proceed.  Pt location: Home Physician Location: Home Name of referring provider:  Janith Lima, MD I connected with Daniel [name] at patients initiation/request on 10/22/2018 at  9:00 AM EDT by video enabled telemedicine application and verified that I am speaking with the correct person using two identifiers. Pt MRN:  [id number] Pt DOB:  1951/01/12 Video Participants:  Daniel [name]   History of Present Illness:  Daniel [name] is a 68 year old right-handed man who follows up for cervicogenic headache.  UPDATE: Increased stress since last visit.  His father passed away in 12-Sep-2022.  His mother is in hospice.  Due to the pandemic, he has not been able to work but has not yet been able to contact anybody about getting unemployment.  Headaches have remained well control but neck pain increased as he has not been able to see Dr. Tamala Julian. Intensity:  moderate Duration:  2 hours Frequency:  4 days a month Frequency of abortive medication: 4 days a month Current NSAIDS:  no Current analgesics:  Tylenol (for headaches) Current triptans:  no Current ergotamine:  no Current anti-emetic:  no Current muscle relaxants:  Tizanidine 2mg  at bedtime PRN Current anti-anxiolytic:  Hydroxyzine at bedtime Current sleep aide:  trazodone Current Antihypertensive medications:  no Current Antidepressant medications:  Amitriptyline 20mg  Current Anticonvulsant  medications:  no Current anti-CGRP:  no Current Vitamins/Herbal/Supplements:  no Current Antihistamines/Decongestants:  no Other therapy:  OMM Other medication:  no  Depression No; Anxiety:  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 Diet:  Low-carb diet. Sleep:  Improved.  HISTORY: In July 2017, he developed right-sided headache, moderate intensity, at the base of his skull with neck stiffness.  He denied any preceding event that triggered it.  It is in the right suboccipital region and radiates up the back of his head on the right.  It feels swollen.  He also has right paraspinal tenderness radiating into the trapezius.  Neck movement in all directions aggravate it.  He denies radicular pain or weakness in the arm, but he reports occasional numbness and tingling in the right hand and forearm, noticeable when sitting in his chair at night.  Since 2016/09/11, he reported a different headache, a sharp moderate intensity right temporal pain.  He notes some increased trouble with night vision.  There are no specific triggers.  ibuprofen helps.  Sed rate from 01/31/17 was 6.  MRI of cervical spine from 12/20/15 revealed mild to moderate cervical degenerative disc disease but no stenosis or nerve root compression.  Past Medical History: Past Medical History:  Diagnosis Date  . Acne   . Anxiety   . DDD (degenerative disc disease), lumbosacral    chronic back pain  . Diverticulosis of colon (without mention of hemorrhage)    colo 09/2010  . Low back pain radiating to both legs     Medications: Outpatient Encounter Medications as of 10/22/2018  Medication Sig  . acetaminophen (TYLENOL) 500 MG tablet Take 1,000  mg by mouth every 6 (six) hours as needed for mild pain.  Marland Kitchen allopurinol (ZYLOPRIM) 100 MG tablet Take 2 tablets (200 mg total) by mouth daily.  Marland Kitchen amitriptyline (ELAVIL) 10 MG tablet TAKE 2 TABLETS (20 MG TOTAL) BY MOUTH AT BEDTIME.  . Diclofenac Sodium (PENNSAID) 2 %  SOLN Place 2 application onto the skin 2 (two) times daily. (Patient taking differently: Place 1 application onto the skin 2 (two) times daily. )  . doxycycline (VIBRA-TABS) 100 MG tablet Take 1 tablet (100 mg total) by mouth 2 (two) times daily.  . hydrOXYzine (ATARAX/VISTARIL) 10 MG tablet TAKE 1 TABLET BY MOUTH THREE TIMES A DAY AS NEEDED  . Multiple Vitamins-Minerals (MULTIVITAMIN PO) Take 1 tablet by mouth at bedtime.   Marland Kitchen tiZANidine (ZANAFLEX) 2 MG tablet Take 1 tablet (2 mg total) by mouth every 8 (eight) hours as needed for muscle spasms.  . traZODone (DESYREL) 50 MG tablet TAKE 1/2 OR 1 TABLET BY MOUTH AT BEDTIME AS NEEEDED FOR SLEEP  . Vitamin D, Ergocalciferol, (DRISDOL) 1.25 MG (50000 UT) CAPS capsule TAKE 1 CAPSULE (50,000 UNITS TOTAL) BY MOUTH EVERY 7 (SEVEN) DAYS.   No facility-administered encounter medications on file as of 10/22/2018.     Allergies: Allergies  Allergen Reactions  . Durezol [Difluprednate]     Eye drops-swelling    Family History: Family History  Problem Relation Age of Onset  . Diabetes Mother   . Hypertension Mother   . Arthritis Mother   . Arthritis Father   . Heart disease Father   . Diabetes Sister   . Cancer Neg Hx   . Stroke Neg Hx   . Kidney disease Neg Hx   . Early death Neg Hx     Social History: Social History   Socioeconomic History  . Marital status: Single    Spouse name: Not on file  . Number of children: Not on file  . Years of education: Not on file  . Highest education level: Not on file  Occupational History  . Not on file  Social Needs  . Financial resource strain: Not on file  . Food insecurity:    Worry: Not on file    Inability: Not on file  . Transportation needs:    Medical: Not on file    Non-medical: Not on file  Tobacco Use  . Smoking status: Never Smoker  . Smokeless tobacco: Never Used  Substance and Sexual Activity  . Alcohol use: Yes    Comment: weekly  . Drug use: No  . Sexual activity: Not on  file  Lifestyle  . Physical activity:    Days per week: Not on file    Minutes per session: Not on file  . Stress: Not on file  Relationships  . Social connections:    Talks on phone: Not on file    Gets together: Not on file    Attends religious service: Not on file    Active member of club or organization: Not on file    Attends meetings of clubs or organizations: Not on file    Relationship status: Not on file  . Intimate partner violence:    Fear of current or ex partner: Not on file    Emotionally abused: Not on file    Physically abused: Not on file    Forced sexual activity: Not on file  Other Topics Concern  . Not on file  Social History Narrative   Caffienated beverages-Yes   Seat belts  often-Yes   Smoke alarm in home-Yes   Firearms/guns in home-Yes   History of physical abuse-NO   HLE- Masters degree   Observations/Objective:   There were no vitals filed for this visit. No acute distress.  Alert and oriented.  Speech fluent and not dysarthric.  Language intact.  Face symmetric.  Assessment and Plan:   Cervicogenic headache, not intractable  1.  For preventative management, continue amitriptyline 20mg  at bedtime 2.  For abortive therapy, Tylenol 3. Advised to take tizanidine every night for neck pain until he can see Dr. Tamala Julian again. 4.  Limit use of pain relievers to no more than 2 days out of week to prevent risk of rebound or medication-overuse headache. 5.  Keep headache diary 6.  Exercise, hydration, caffeine cessation, sleep hygiene, monitor for and avoid triggers 7.  Consider:  magnesium citrate 400mg  daily, riboflavin 400mg  daily, and coenzyme Q10 100mg  three times daily 8. Always keep in mind that currently taking a hormone or birth control may be a possible trigger or aggravating factor for migraine. 9. Follow up 9 months  Follow Up Instructions:    -I discussed the assessment and treatment plan with the patient. The patient was provided an  opportunity to ask questions and all were answered. The patient agreed with the plan and demonstrated an understanding of the instructions.   The patient was advised to call back or seek an in-person evaluation if the symptoms worsen or if the condition fails to improve as anticipated.   Dudley Major, DO

## 2018-10-22 ENCOUNTER — Other Ambulatory Visit: Payer: Self-pay

## 2018-10-22 ENCOUNTER — Telehealth (INDEPENDENT_AMBULATORY_CARE_PROVIDER_SITE_OTHER): Payer: Medicare Other | Admitting: Neurology

## 2018-10-22 ENCOUNTER — Ambulatory Visit: Payer: Medicare HMO | Admitting: Neurology

## 2018-10-22 ENCOUNTER — Encounter: Payer: Self-pay | Admitting: Neurology

## 2018-10-22 VITALS — Ht 69.0 in | Wt 195.0 lb

## 2018-10-22 DIAGNOSIS — R51 Headache: Secondary | ICD-10-CM

## 2018-10-22 DIAGNOSIS — G4486 Cervicogenic headache: Secondary | ICD-10-CM

## 2018-12-31 DIAGNOSIS — Z9889 Other specified postprocedural states: Secondary | ICD-10-CM | POA: Diagnosis not present

## 2018-12-31 DIAGNOSIS — H2511 Age-related nuclear cataract, right eye: Secondary | ICD-10-CM | POA: Diagnosis not present

## 2018-12-31 DIAGNOSIS — Z961 Presence of intraocular lens: Secondary | ICD-10-CM | POA: Diagnosis not present

## 2019-03-01 DIAGNOSIS — Z23 Encounter for immunization: Secondary | ICD-10-CM | POA: Diagnosis not present

## 2019-03-05 ENCOUNTER — Other Ambulatory Visit: Payer: Self-pay | Admitting: Neurology

## 2019-03-05 NOTE — Telephone Encounter (Signed)
Requested Prescriptions   Pending Prescriptions Disp Refills  . amitriptyline (ELAVIL) 10 MG tablet [Pharmacy Med Name: AMITRIPTYLINE HCL 10 MG TAB] 180 tablet 1    Sig: TAKE 2 TABLETS (20 MG TOTAL) BY MOUTH AT BEDTIME.   Rx last filled: 09/06/18 #180 1 refills  Pt last seen:10/22/18  Follow up appt scheduled:07/23/2019

## 2019-03-20 ENCOUNTER — Other Ambulatory Visit: Payer: Self-pay | Admitting: Family Medicine

## 2019-04-09 ENCOUNTER — Ambulatory Visit: Payer: Medicare Other | Admitting: Family Medicine

## 2019-07-19 DIAGNOSIS — Z23 Encounter for immunization: Secondary | ICD-10-CM | POA: Diagnosis not present

## 2019-07-23 ENCOUNTER — Ambulatory Visit: Payer: Medicare Other | Admitting: Neurology

## 2019-08-16 DIAGNOSIS — Z23 Encounter for immunization: Secondary | ICD-10-CM | POA: Diagnosis not present

## 2019-08-27 ENCOUNTER — Other Ambulatory Visit: Payer: Self-pay

## 2019-08-27 ENCOUNTER — Ambulatory Visit (INDEPENDENT_AMBULATORY_CARE_PROVIDER_SITE_OTHER): Payer: Medicare Other | Admitting: Family Medicine

## 2019-08-27 ENCOUNTER — Encounter: Payer: Self-pay | Admitting: Family Medicine

## 2019-08-27 VITALS — BP 126/80 | HR 66 | Ht 69.0 in | Wt 211.0 lb

## 2019-08-27 DIAGNOSIS — M5137 Other intervertebral disc degeneration, lumbosacral region: Secondary | ICD-10-CM

## 2019-08-27 DIAGNOSIS — M999 Biomechanical lesion, unspecified: Secondary | ICD-10-CM | POA: Diagnosis not present

## 2019-08-27 MED ORDER — METHYLPREDNISOLONE ACETATE 80 MG/ML IJ SUSP
80.0000 mg | Freq: Once | INTRAMUSCULAR | Status: AC
  Administered 2019-08-27: 80 mg via INTRAMUSCULAR

## 2019-08-27 MED ORDER — KETOROLAC TROMETHAMINE 60 MG/2ML IM SOLN
60.0000 mg | Freq: Once | INTRAMUSCULAR | Status: AC
  Administered 2019-08-27: 60 mg via INTRAMUSCULAR

## 2019-08-27 MED ORDER — AMITRIPTYLINE HCL 25 MG PO TABS: 25.0000 mg | ORAL_TABLET | Freq: Every day | ORAL | 1 refills | Status: DC

## 2019-08-27 NOTE — Assessment & Plan Note (Signed)

## 2019-08-27 NOTE — Assessment & Plan Note (Signed)
Chronic problem with significant exacerbation at this time.  Will increase patient's amitriptyline to 25 mg at night.  Patient given Toradol and Depo-Medrol today.  Continue with muscle relaxer as needed and trazodone at night.  Hydroxyzine for breakthrough pain.  Patient will follow up with me again in 6 weeks.  Responded well to manipulation

## 2019-08-27 NOTE — Progress Notes (Signed)
[facility] [address] [place] Phone: [phone] Subjective:   I Daniel [name] am serving as a Education administrator for [name].  This visit occurred during the SARS-CoV-2 public health emergency.  Safety protocols were in place, including screening questions prior to the visit, additional usage of staff PPE, and extensive cleaning of exam room while observing appropriate contact time as indicated for disinfecting solutions.   I'm seeing this patient by the request  of:  Patient, No Pcp Per  CC: Neck pain, low back pain  [id number]  Daniel [name] is a 69 y.o. male coming in with complaint of back pain. Last seen on 07/17/2018 for OMT. Patient states he is having issues with his back. Wanted to know about CBD oil and its benefits as well as other medications. Standing for long periods of time and moving things causes pain and the patient's back gives out. States he has a needle sensation in his feet. Has started walking again. Ball of his foot feels like "his sock is bunched up under his foot".  Has not been able to be as active.  It did not take care of his parents estate with losing both of them this last year      Past Medical History:  Diagnosis Date  . Acne   . Anxiety   . DDD (degenerative disc disease), lumbosacral    chronic back pain  . Diverticulosis of colon (without mention of hemorrhage)    colo 09/2010  . Low back pain radiating to both legs    Past Surgical History:  Procedure Laterality Date  . CATARACT EXTRACTION Left 07/14/2015  . CYST EXCISION Left 06/23/2015   Procedure: EXCISION MASS LEFT INDEX FINGER;  Surgeon: Daryll Brod, MD;  Location: [facility];  Service: Orthopedics;  Laterality: Left;  ANESTHESIA: IV REGIONAL/FAB  . HERNIA REPAIR    . REFRACTIVE SURGERY     left eye/ with enhancement  . TONSILLECTOMY     Social History   Socioeconomic History  . Marital status: Single    Spouse name:  Not on file  . Number of children: Not on file  . Years of education: Not on file  . Highest education level: Not on file  Occupational History  . Not on file  Tobacco Use  . Smoking status: Never Smoker  . Smokeless tobacco: Never Used  Substance and Sexual Activity  . Alcohol use: Yes    Comment: weekly  . Drug use: No  . Sexual activity: Not on file  Other Topics Concern  . Not on file  Social History Narrative   Caffienated beverages-Yes   Seat belts often-Yes   Smoke alarm in home-Yes   Firearms/guns in home-Yes   History of physical abuse-NO   HLE- Masters degree   Right handed   Two story home   Lives alone   Education- Masters   Social Determinants of Health   Financial Resource Strain:   . Difficulty of Paying Living Expenses:   Food Insecurity:   . Worried About Charity fundraiser in the Last Year:   . Arboriculturist in the Last Year:   Transportation Needs:   . Film/video editor (Medical):   Marland Kitchen Lack of Transportation (Non-Medical):   Physical Activity:   . Days of Exercise per Week:   . Minutes of Exercise per Session:   Stress:   . Feeling of Stress :   Social Connections:   .  Frequency of Communication with Friends and Family:   . Frequency of Social Gatherings with Friends and Family:   . Attends Religious Services:   . Active Member of Clubs or Organizations:   . Attends Archivist Meetings:   Marland Kitchen Marital Status:    Allergies  Allergen Reactions  . Durezol [Difluprednate]     Eye drops-swelling   Family History  Problem Relation Age of Onset  . Diabetes Mother   . Hypertension Mother   . Arthritis Mother   . Arthritis Father   . Heart disease Father   . Diabetes Sister   . Cancer Neg Hx   . Stroke Neg Hx   . Kidney disease Neg Hx   . Early death Neg Hx        Current Outpatient Medications (Analgesics):  .  acetaminophen (TYLENOL) 500 MG tablet, Take 1,000 mg by mouth every 6 (six) hours as needed for mild  pain.   Current Outpatient Medications (Other):  .  doxycycline (ADOXA) 100 MG tablet, Take 100 mg by mouth daily. .  hydrOXYzine (ATARAX/VISTARIL) 10 MG tablet, TAKE 1 TABLET BY MOUTH THREE TIMES A DAY AS NEEDED .  Multiple Vitamins-Minerals (MULTIVITAMIN PO), Take 1 tablet by mouth at bedtime.  Marland Kitchen  tiZANidine (ZANAFLEX) 2 MG tablet, Take 1 tablet (2 mg total) by mouth every 8 (eight) hours as needed for muscle spasms. .  traZODone (DESYREL) 50 MG tablet, TAKE 1/2 OR 1 TABLET BY MOUTH AT BEDTIME AS NEEEDED FOR SLEEP .  amitriptyline (ELAVIL) 25 MG tablet, Take 1 tablet (25 mg total) by mouth at bedtime.   Reviewed prior external information including notes and imaging from  primary care provider As well as notes that were available from care everywhere and other healthcare systems.  Past medical history, social, surgical and family history all reviewed in electronic medical record.  No pertanent information unless stated regarding to the chief complaint.   Review of Systems:  No headache, visual changes, nausea, vomiting, diarrhea, constipation, dizziness, abdominal pain, skin rash, fevers, chills, night sweats, weight loss, swollen lymph nodes,  joint swelling, chest pain, shortness of breath, mood changes. POSITIVE muscle aches, body aches  Objective  Blood pressure 126/80, pulse 66, height 5\' 9"  (1.753 m), weight 211 lb (95.7 kg), SpO2 96 %.   General: No apparent distress alert and oriented x3 mood and affect normal, dressed appropriately.  HEENT: Pupils equal, extraocular movements intact  Respiratory: Patient's speak in full sentences and does not appear short of breath  Cardiovascular: No lower extremity edema, non tender, no erythema  Neuro: Cranial nerves II through XII are intact, neurovascularly intact in all extremities with 2+ DTRs and 2+ pulses.  Gait normal with good balance and coordination.  MSK:  Non tender with full range of motion and good stability and symmetric  strength and tone of shoulders, elbows, wrist, hip, knee and ankles bilaterally.  Significant tightness of multiple joints including his neck and back.  Patient does have positive Faber's bilaterally.  Decreased range of motion in extension and flexion.  Negative no radicular symptoms at the moment.  4+ out of 5 strength in lower extremities bilaterally  Osteopathic findings  C4 flexed rotated and side bent left C6 flexed rotated and side bent left T5 extended rotated and side bent right inhaled 5 rib T9 extended rotated and side bent left L2 flexed rotated and side bent right Sacrum right on right     Impression and Recommendations:  This case required medical decision making of moderate complexity. The above documentation has been reviewed and is accurate and complete Lyndal Pulley, DO       Note: This dictation was prepared with Dragon dictation along with smaller phrase technology. Any transcriptional errors that result from this process are unintentional.

## 2019-08-27 NOTE — Patient Instructions (Signed)
Good to see you  Ice is your friend Increase amitriptyline to 25mg  at night  2 injections today  See me again in 6 weeks

## 2019-09-11 ENCOUNTER — Other Ambulatory Visit: Payer: Self-pay | Admitting: Neurology

## 2019-09-26 ENCOUNTER — Other Ambulatory Visit: Payer: Self-pay | Admitting: Family Medicine

## 2019-10-07 ENCOUNTER — Ambulatory Visit (INDEPENDENT_AMBULATORY_CARE_PROVIDER_SITE_OTHER): Payer: Medicare Other | Admitting: Family Medicine

## 2019-10-07 ENCOUNTER — Other Ambulatory Visit: Payer: Self-pay

## 2019-10-07 ENCOUNTER — Encounter: Payer: Self-pay | Admitting: Family Medicine

## 2019-10-07 VITALS — BP 120/72 | HR 63 | Ht 69.0 in | Wt 213.0 lb

## 2019-10-07 DIAGNOSIS — M7061 Trochanteric bursitis, right hip: Secondary | ICD-10-CM | POA: Diagnosis not present

## 2019-10-07 DIAGNOSIS — M5137 Other intervertebral disc degeneration, lumbosacral region: Secondary | ICD-10-CM

## 2019-10-07 DIAGNOSIS — M999 Biomechanical lesion, unspecified: Secondary | ICD-10-CM | POA: Diagnosis not present

## 2019-10-07 NOTE — Patient Instructions (Signed)
Pennsaid on side of hip Exercises 3x a week See me in 5-6 weeks if not better will inject hip

## 2019-10-07 NOTE — Progress Notes (Signed)
[place] Phone: [phone] Subjective:    I'm seeing this patient by the request  of:  Patient, No Pcp Per  CC: Neck pain and back pain follow-up  [id number]  Daniel [name] is a 69 y.o. male coming in with complaint of back pain. Last seen on 08/27/2019 for OMT. Patient states that he is having a catching sensation over the right hip.   Patient continues to have some difficulty.  Is making some very mild progress but is noticing more dependent tenderness on the lateral aspect of the right hip.  Can wake him up at night.  Seems worse after sitting for a while.  Severe tenderness immediately when walking and then seems to get better with activity.  Denies any significant radiation down the legs.     Past Medical History:  Diagnosis Date  . Acne   . Anxiety   . DDD (degenerative disc disease), lumbosacral    chronic back pain  . Diverticulosis of colon (without mention of hemorrhage)    colo 09/2010  . Low back pain radiating to both legs    Past Surgical History:  Procedure Laterality Date  . CATARACT EXTRACTION Left 07/14/2015  . CYST EXCISION Left 06/23/2015   Procedure: EXCISION MASS LEFT INDEX FINGER;  Surgeon: Daryll Brod, MD;  Location: [facility];  Service: Orthopedics;  Laterality: Left;  ANESTHESIA: IV REGIONAL/FAB  . HERNIA REPAIR    . REFRACTIVE SURGERY     left eye/ with enhancement  . TONSILLECTOMY     Social History   Socioeconomic History  . Marital status: Single    Spouse name: Not on file  . Number of children: Not on file  . Years of education: Not on file  . Highest education level: Not on file  Occupational History  . Not on file  Tobacco Use  . Smoking status: Never Smoker  . Smokeless tobacco: Never Used  Substance and Sexual Activity  . Alcohol use: Yes    Comment: weekly  . Drug use: No  . Sexual activity: Not on file  Other Topics Concern  . Not on  file  Social History Narrative   Caffienated beverages-Yes   Seat belts often-Yes   Smoke alarm in home-Yes   Firearms/guns in home-Yes   History of physical abuse-NO   HLE- Masters degree   Right handed   Two story home   Lives alone   Education- Masters   Social Determinants of Health   Financial Resource Strain:   . Difficulty of Paying Living Expenses:   Food Insecurity:   . Worried About Charity fundraiser in the Last Year:   . Arboriculturist in the Last Year:   Transportation Needs:   . Film/video editor (Medical):   Marland Kitchen Lack of Transportation (Non-Medical):   Physical Activity:   . Days of Exercise per Week:   . Minutes of Exercise per Session:   Stress:   . Feeling of Stress :   Social Connections:   . Frequency of Communication with Friends and Family:   . Frequency of Social Gatherings with Friends and Family:   . Attends Religious Services:   . Active Member of Clubs or Organizations:   . Attends Archivist Meetings:   Marland Kitchen Marital Status:    Allergies  Allergen Reactions  . Durezol [Difluprednate]     Eye drops-swelling   Family History  Problem Relation  Age of Onset  . Diabetes Mother   . Hypertension Mother   . Arthritis Mother   . Arthritis Father   . Heart disease Father   . Diabetes Sister   . Cancer Neg Hx   . Stroke Neg Hx   . Kidney disease Neg Hx   . Early death Neg Hx        Current Outpatient Medications (Analgesics):  .  acetaminophen (TYLENOL) 500 MG tablet, Take 1,000 mg by mouth every 6 (six) hours as needed for mild pain.   Current Outpatient Medications (Other):  .  amitriptyline (ELAVIL) 25 MG tablet, TAKE 1 TABLET BY MOUTH EVERYDAY AT BEDTIME .  doxycycline (ADOXA) 100 MG tablet, Take 100 mg by mouth daily. .  hydrOXYzine (ATARAX/VISTARIL) 10 MG tablet, TAKE 1 TABLET BY MOUTH THREE TIMES A DAY AS NEEDED .  Multiple Vitamins-Minerals (MULTIVITAMIN PO), Take 1 tablet by mouth at bedtime.  Marland Kitchen  tiZANidine  (ZANAFLEX) 2 MG tablet, Take 1 tablet (2 mg total) by mouth every 8 (eight) hours as needed for muscle spasms. .  traZODone (DESYREL) 50 MG tablet, TAKE 1/2 OR 1 TABLET BY MOUTH AT BEDTIME AS NEEEDED FOR SLEEP   Reviewed prior external information including notes and imaging from  primary care provider As well as notes that were available from care everywhere and other healthcare systems.  Past medical history, social, surgical and family history all reviewed in electronic medical record.  No pertanent information unless stated regarding to the chief complaint.   Review of Systems:  No headache, visual changes, nausea, vomiting, diarrhea, constipation, dizziness, abdominal pain, skin rash, fevers, chills, night sweats, weight loss, swollen lymph nodes, body aches, joint swelling, chest pain, shortness of breath, mood changes. POSITIVE muscle aches  Objective  Blood pressure 120/72, pulse 63, height 5\' 9"  (1.753 m), weight 213 lb (96.6 kg), SpO2 97 %.   General: No apparent distress alert and oriented x3 mood and affect normal, dressed appropriately.  HEENT: Pupils equal, extraocular movements intact  Respiratory: Patient's speak in full sentences and does not appear short of breath  Cardiovascular: No lower extremity edema, non tender, no erythema  Neuro: Cranial nerves II through XII are intact, neurovascularly intact in all extremities with 2+ DTRs and 2+ pulses.  Gait mild antalgic MSK: .  Neck exam shows some mild loss of lordosis, some tightness noted in the paraspinal musculature of the cervical spine.  Tightness noted in the parascapular region on the right side.  Tenderness noted over the sacroiliac joint right greater than left with a positive Corky Sox.  Severe tenderness to palpation of the greater trochanteric area on the right side as well.  Osteopathic findings  C2 flexed rotated and side bent right C4 flexed rotated and side bent left C6 flexed rotated and side bent left T3  extended rotated and side bent right inhaled third rib T9 extended rotated and side bent left L2 flexed rotated and side bent right Sacrum right on right    Impression and Recommendations:     This case required medical decision making of moderate complexity. The above documentation has been reviewed and is accurate and complete Lyndal Pulley, DO       Note: This dictation was prepared with Dragon dictation along with smaller phrase technology. Any transcriptional errors that result from this process are unintentional.

## 2019-10-07 NOTE — Assessment & Plan Note (Signed)
Patient is having an exacerbation.  May need to consider injection at follow-up topical anti-inflammatories suggested and given sample

## 2019-10-07 NOTE — Assessment & Plan Note (Signed)
Degenerative disc disease.  Likely contributing to some of the aches and pains.  Discussed continuing to work on the core strengthening.  Discussed medication management including the amitriptyline, Zanaflex, and trazodone.  Patient is somewhat stable compared to previous exam but still an exacerbation from patient's baseline.  Patient does have some external factors as well still with stress.  Follow-up again 4 to 8 weeks.

## 2019-10-07 NOTE — Assessment & Plan Note (Signed)

## 2019-10-14 ENCOUNTER — Telehealth: Payer: Self-pay | Admitting: Family Medicine

## 2019-10-14 NOTE — Telephone Encounter (Signed)
Patient called requesting to speak with you regarding a bill that he received from his April visit. He said that he has never been billed for any appointments with Dr Tamala Julian like this before and does not understand why it would be so much. He explained that he has the same insurance that he did last year also.

## 2019-10-24 NOTE — Progress Notes (Signed)
NEUROLOGY FOLLOW UP OFFICE NOTE  Daniel [name] [id number]  HISTORY OF PRESENT ILLNESS: Daniel [name] is a 69 year old right-handed man who follows up for cervicogenic headache.  UPDATE: He started seeing Dr. Tamala Julian again in April.  He is doing very well.  He has not noticed any recent headaches.   Frequency of abortive medication:4 days a month Current NSAIDS:no Current analgesics:Tylenol (for headaches) Current triptans:no Current ergotamine:no Current anti-emetic:no Current muscle relaxants:Tizanidine 2mg at bedtime PRN Current anti-anxiolytic:Hydroxyzine at bedtime Current sleep aide:trazodone Current Antihypertensive medications:no Current Antidepressant medications:Amitriptyline 20mg  Current Anticonvulsant medications:no Current anti-CGRP:no Current Vitamins/Herbal/Supplements:no Current Antihistamines/Decongestants:no Other therapy:OMM Other medication:no  DepressionNo; Anxiety: 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 Diet: Low-carb diet. Sleep: Improved.  HISTORY: In July 2017, he developed right-sided headache, moderate intensity, at the base of his skull with neck stiffness. He denied any preceding event that triggered it. It is in the right suboccipital region and radiates up the back of his head on the right. It feels swollen. He also has right paraspinal tenderness radiating into the trapezius. Neck movement in all directions aggravate it.He denies radicular pain or weakness in the arm, but he reports occasional numbness and tingling in the right hand and forearm,noticeable when sitting in his chair at night.  Since 2018, he reported a different headache, a sharp moderate intensity right temporal pain. He notes some increased trouble with night vision. There are no specific triggers. ibuprofen helps. Sed rate from 01/31/17 was 6.  MRI of cervical spine from 12/20/15 revealed mild to  moderate cervical degenerative disc disease but no stenosis or nerve root compression.  PAST MEDICAL HISTORY: Past Medical History:  Diagnosis Date  . Acne   . Anxiety   . DDD (degenerative disc disease), lumbosacral    chronic back pain  . Diverticulosis of colon (without mention of hemorrhage)    colo 09/2010  . Low back pain radiating to both legs     MEDICATIONS: Current Outpatient Medications on File Prior to Visit  Medication Sig Dispense Refill  . acetaminophen (TYLENOL) 500 MG tablet Take 1,000 mg by mouth every 6 (six) hours as needed for mild pain.    Marland Kitchen amitriptyline (ELAVIL) 25 MG tablet TAKE 1 TABLET BY MOUTH EVERYDAY AT BEDTIME 90 tablet 1  . doxycycline (ADOXA) 100 MG tablet Take 100 mg by mouth daily.    . hydrOXYzine (ATARAX/VISTARIL) 10 MG tablet TAKE 1 TABLET BY MOUTH THREE TIMES A DAY AS NEEDED 270 tablet 1  . Multiple Vitamins-Minerals (MULTIVITAMIN PO) Take 1 tablet by mouth at bedtime.     Marland Kitchen tiZANidine (ZANAFLEX) 2 MG tablet Take 1 tablet (2 mg total) by mouth every 8 (eight) hours as needed for muscle spasms. 30 tablet 3  . traZODone (DESYREL) 50 MG tablet TAKE 1/2 OR 1 TABLET BY MOUTH AT BEDTIME AS NEEEDED FOR SLEEP 90 tablet 2   No current facility-administered medications on file prior to visit.    ALLERGIES: Allergies  Allergen Reactions  . Durezol [Difluprednate]     Eye drops-swelling    FAMILY HISTORY: Family History  Problem Relation Age of Onset  . Diabetes Mother   . Hypertension Mother   . Arthritis Mother   . Arthritis Father   . Heart disease Father   . Diabetes Sister   . Cancer Neg Hx   . Stroke Neg Hx   . Kidney disease Neg Hx   . Early death Neg Hx    SOCIAL HISTORY: Social History  Socioeconomic History  . Marital status: Single    Spouse name: Not on file  . Number of children: Not on file  . Years of education: Not on file  . Highest education level: Not on file  Occupational History  . Not on file  Tobacco Use  .  Smoking status: Never Smoker  . Smokeless tobacco: Never Used  Substance and Sexual Activity  . Alcohol use: Yes    Comment: weekly  . Drug use: No  . Sexual activity: Not on file  Other Topics Concern  . Not on file  Social History Narrative   Caffienated beverages-Yes   Seat belts often-Yes   Smoke alarm in home-Yes   Firearms/guns in home-Yes   History of physical abuse-NO   HLE- Masters degree   Right handed   Two story home   Lives alone   Education- Masters   Social Determinants of Health   Financial Resource Strain:   . Difficulty of Paying Living Expenses:   Food Insecurity:   . Worried About Charity fundraiser in the Last Year:   . Arboriculturist in the Last Year:   Transportation Needs:   . Film/video editor (Medical):   Marland Kitchen Lack of Transportation (Non-Medical):   Physical Activity:   . Days of Exercise per Week:   . Minutes of Exercise per Session:   Stress:   . Feeling of Stress :   Social Connections:   . Frequency of Communication with Friends and Family:   . Frequency of Social Gatherings with Friends and Family:   . Attends Religious Services:   . Active Member of Clubs or Organizations:   . Attends Archivist Meetings:   Marland Kitchen Marital Status:   Intimate Partner Violence:   . Fear of Current or Ex-Partner:   . Emotionally Abused:   Marland Kitchen Physically Abused:   . Sexually Abused:     PHYSICAL EXAM: Blood pressure 131/86, pulse 73, height 5\' 9"  (1.753 m), weight 213 lb 3.2 oz (96.7 kg), SpO2 96 %. General: No acute distress.  Patient appears well-groomed.   Head:  Normocephalic/atraumatic Eyes:  Fundi examined but not visualized Neck: supple, mild left-sided tenderness, full range of motion Heart:  Regular rate and rhythm Lungs:  Clear to auscultation bilaterally Back: No paraspinal tenderness Neurological Exam: alert and oriented to person, place, and time. Attention span and concentration intact, recent and remote memory intact, fund of  knowledge intact.  Speech fluent and not dysarthric, language intact.  CN II-XII intact. Bulk and tone normal, muscle strength 5/5 throughout.  Sensation to light touch, temperature and vibration intact.  Deep tendon reflexes 2+ throughout, toes downgoing.  Finger to nose and heel to shin testing intact.  Gait normal, Romberg negative.  IMPRESSION: Cervicogenic headache, not intractable.  Pretty much resolved to infrequent/manageable.    PLAN: Ongoing treatment by Dr. Tamala Julian Follow up with me as needed.  Metta Clines, DO

## 2019-10-25 NOTE — Telephone Encounter (Signed)
Spoke with pt, we went over his bill together and advised him to reach out to his insurance company. The codes that Dr. Tamala Julian billed for are the same as usual but for some reason his insurance did not pay this time. Pt understood and agreed to contact his insurance & will call back if any further questions.

## 2019-10-28 ENCOUNTER — Ambulatory Visit (INDEPENDENT_AMBULATORY_CARE_PROVIDER_SITE_OTHER): Payer: Medicare Other | Admitting: Neurology

## 2019-10-28 ENCOUNTER — Other Ambulatory Visit: Payer: Self-pay

## 2019-10-28 ENCOUNTER — Encounter: Payer: Self-pay | Admitting: Neurology

## 2019-10-28 VITALS — BP 131/86 | HR 73 | Ht 69.0 in | Wt 213.2 lb

## 2019-10-28 DIAGNOSIS — R519 Headache, unspecified: Secondary | ICD-10-CM

## 2019-10-28 DIAGNOSIS — G4486 Cervicogenic headache: Secondary | ICD-10-CM

## 2019-11-12 ENCOUNTER — Encounter: Payer: Self-pay | Admitting: Family Medicine

## 2019-11-12 ENCOUNTER — Other Ambulatory Visit: Payer: Self-pay

## 2019-11-12 ENCOUNTER — Ambulatory Visit (INDEPENDENT_AMBULATORY_CARE_PROVIDER_SITE_OTHER): Payer: Medicare Other | Admitting: Family Medicine

## 2019-11-12 VITALS — BP 112/70 | HR 83 | Ht 69.0 in | Wt 213.0 lb

## 2019-11-12 DIAGNOSIS — M5137 Other intervertebral disc degeneration, lumbosacral region: Secondary | ICD-10-CM

## 2019-11-12 DIAGNOSIS — M999 Biomechanical lesion, unspecified: Secondary | ICD-10-CM | POA: Diagnosis not present

## 2019-11-12 DIAGNOSIS — M5030 Other cervical disc degeneration, unspecified cervical region: Secondary | ICD-10-CM | POA: Diagnosis not present

## 2019-11-12 NOTE — Patient Instructions (Signed)
See me again in 4-6 weeks  

## 2019-11-12 NOTE — Progress Notes (Signed)
[place] Phone: [phone] Subjective:   Daniel Daniel, am serving as a scribe for [name]. This visit occurred during the SARS-CoV-2 public health emergency.  Safety protocols were in place, including screening questions prior to the visit, additional usage of staff PPE, and extensive cleaning of exam room while observing appropriate contact time as indicated for disinfecting solutions.   I'm seeing this patient by the request  of:  Patient, Daniel Daniel Per  CC:   [id number]  Daniel Daniel is a 69 y.o. male coming in with complaint of back and neck pain. OMT 10/07/2019. Patient states   Medications patient has been prescribed: Trazodone for sleep as well as Zanaflex as needed.  Patient has needed to take it a couple times.  Continuing amitriptyline that has been helpful          Reviewed prior external information including notes and imaging from previsou exam, outside providers and external EMR if available.   As well as notes that were available from care everywhere and other healthcare systems.  Past medical history, social, surgical and family history all reviewed in electronic medical record.  Daniel pertanent information unless stated regarding to the chief complaint.   Past Medical History:  Diagnosis Date   Acne    Anxiety    DDD (degenerative disc disease), lumbosacral    chronic back pain   Diverticulosis of colon (without mention of hemorrhage)    colo 09/2010   Low back pain radiating to both legs     Allergies  Allergen Reactions   Durezol [Difluprednate]     Eye drops-swelling     Review of Systems:  Daniel  visual changes, nausea, vomiting, diarrhea, constipation, dizziness, abdominal pain, skin rash, fevers, chills, night sweats, weight loss, swollen lymph nodes, , joint swelling, chest pain, shortness of breath, mood changes. POSITIVE muscle aches, body aches,  headaches  Objective  Blood pressure 112/70, pulse 83, height 5\' 9"  (1.753 m), weight 213 lb (96.6 kg), SpO2 98 %.   General: Daniel apparent distress alert and oriented x3 mood and affect normal, dressed appropriately.  HEENT: Pupils equal, extraocular movements intact  Respiratory: Patient's speak in full sentences and does not appear short of breath  Cardiovascular: Daniel lower extremity edema, non tender, Daniel erythema  Neuro: Cranial nerves II through XII are intact, neurovascularly intact in all extremities with 2+ DTRs and 2+ pulses.  Gait normal with good balance and coordination.  MSK:  Non tender with full range of motion and good stability and symmetric strength and tone of shoulders, elbows, wrist, hip, knee and ankles bilaterally.  Back - Normal skin, Spine with normal alignment and Daniel deformity.  Daniel tenderness to vertebral process palpation.  Paraspinous muscles are not tender and without spasm.   Range of motion is full at neck and lumbar sacral regions  Osteopathic findings  C2 flexed rotated and side bent right C6 flexed rotated and side bent left T3 extended rotated and side bent right inhaled rib T7 extended rotated and side bent left L2 flexed rotated and side bent right Sacrum right on right       Assessment and Plan:  Degenerative disc disease lumbar.  Stable at the moment.  Has had exacerbations some mild tightness noted again today though.  We discussed medication management in great time here including the Zanaflex and the trazodone.  Discussed posture and ergonomics.  Discussed which activities to do which  wants to avoid.  Patient does have a lot more increased stress recently that I think is contributing as well.  Once again 4 to 8 weeks.  Degenerative disc disease cervical.  Continue amitriptyline.  Patient has been released by neurologist  Nonallopathic problems  Decision today to treat with OMT was based on Physical Exam  After verbal consent patient was  treated with HVLA, ME, FPR techniques in cervical, rib, thoracic, lumbar, and sacral  areas  Patient tolerated the procedure well with improvement in symptoms  Patient given exercises, stretches and lifestyle modifications  See medications in patient instructions if given  Patient will follow up in 4-8 weeks      The above documentation has been reviewed and is accurate and complete Lyndal Pulley, DO       Note: This dictation was prepared with Dragon dictation along with smaller phrase technology. Any transcriptional errors that result from this process are unintentional.

## 2019-12-17 ENCOUNTER — Other Ambulatory Visit: Payer: Self-pay

## 2019-12-17 ENCOUNTER — Encounter: Payer: Self-pay | Admitting: Family Medicine

## 2019-12-17 ENCOUNTER — Ambulatory Visit (INDEPENDENT_AMBULATORY_CARE_PROVIDER_SITE_OTHER): Payer: Medicare Other | Admitting: Family Medicine

## 2019-12-17 VITALS — BP 140/90 | HR 66 | Ht 69.0 in | Wt 216.0 lb

## 2019-12-17 DIAGNOSIS — M5137 Other intervertebral disc degeneration, lumbosacral region: Secondary | ICD-10-CM | POA: Diagnosis not present

## 2019-12-17 DIAGNOSIS — M999 Biomechanical lesion, unspecified: Secondary | ICD-10-CM | POA: Diagnosis not present

## 2019-12-17 NOTE — Assessment & Plan Note (Signed)
Severe but stable.  Patient doing well with the medications at this time.  He does have some tightness.  Did not make any significant changes.  Did discuss with medications.  Increase activity slowly.  Follow-up again in 4 to 8 weeks

## 2019-12-17 NOTE — Patient Instructions (Signed)
Good to see you  See me again in 5-6 weeks  

## 2019-12-17 NOTE — Progress Notes (Signed)
Daniel [name] [address] [place] Phone: [phone] Subjective:   I [name] am serving as a Education administrator for [name].  This visit occurred during the SARS-CoV-2 public health emergency.  Safety protocols were in place, including screening questions prior to the visit, additional usage of staff PPE, and extensive cleaning of exam room while observing appropriate contact time as indicated for disinfecting solutions.   I'm seeing this patient by the request  of:  Patient, No Pcp Per  CC: neck pain   [id number]  Daniel [name] is a 69 y.o. male coming in with complaint of back and neck pain. OMT 11/12/2019. Patient states he is doing ok. States he had to use pennsaid on his elbow. Hit his elbow stepping on a rake.   Medications patient has been prescribed: zanaflex   Taking: yes          Reviewed prior external information including notes and imaging from previsou exam, outside providers and external EMR if available.   As well as notes that were available from care everywhere and other healthcare systems.  Past medical history, social, surgical and family history all reviewed in electronic medical record.  No pertanent information unless stated regarding to the chief complaint.   Past Medical History:  Diagnosis Date  . Acne   . Anxiety   . DDD (degenerative disc disease), lumbosacral    chronic back pain  . Diverticulosis of colon (without mention of hemorrhage)    colo 09/2010  . Low back pain radiating to both legs     Allergies  Allergen Reactions  . Durezol [Difluprednate]     Eye drops-swelling     Review of Systems:  No headache, visual changes, nausea, vomiting, diarrhea, constipation, dizziness, abdominal pain, skin rash, fevers, chills, night sweats, weight loss, swollen lymph nodes, body aches, joint swelling, chest pain, shortness of breath, mood changes. POSITIVE muscle aches  Objective  Blood  pressure (!) 140/90, pulse 66, height 5\' 9"  (1.753 m), weight (!) 216 lb (98 kg), SpO2 97 %.   General: No apparent distress alert and oriented x3 mood and affect normal, dressed appropriately.  HEENT: Pupils equal, extraocular movements intact  Respiratory: Patient's speak in full sentences and does not appear short of breath  Cardiovascular: No lower extremity edema, non tender, no erythema  Neuro: Cranial nerves II through XII are intact, neurovascularly intact in all extremities with 2+ DTRs and 2+ pulses.  Gait normal with good balance and coordination.  MSK:  Non tender with full range of motion and good stability and symmetric strength and tone of shoulders, elbows, wrist, hip, knee and ankles bilaterally.  Back -neck exam does have some mild loss of lordosis.  Tenderness to palpation in the paraspinal musculature in the parascapular region.  Also has some in the lumbar area.  Patient does have more tightness of the lower back than usual.  With mild tightness of the straight leg test as well.  Neurovascular intact distally with 5-5 strength over the lower extremities  Osteopathic findings  C6 flexed rotated and side bent left T6 extended rotated and side bent right inhaled rib T8 extended rotated and side bent right L2 flexed rotated and side bent right Sacrum right on right       Assessment and Plan:  DDD (degenerative disc disease), lumbosacral Severe but stable.  Patient doing well with the medications at this time.  He does have some tightness.  Did not make  any significant changes.  Did discuss with medications.  Increase activity slowly.  Follow-up again in 4 to 8 weeks    Nonallopathic problems  Decision today to treat with OMT was based on Physical Exam  After verbal consent patient was treated with HVLA, ME, FPR techniques in cervical, rib, thoracic, lumbar, and sacral  areas  Patient tolerated the procedure well with improvement in symptoms  Patient given  exercises, stretches and lifestyle modifications  See medications in patient instructions if given  Patient will follow up in 4-8 weeks      The above documentation has been reviewed and is accurate and complete Lyndal Pulley, DO       Note: This dictation was prepared with Dragon dictation along with smaller phrase technology. Any transcriptional errors that result from this process are unintentional.

## 2019-12-31 DIAGNOSIS — H2511 Age-related nuclear cataract, right eye: Secondary | ICD-10-CM | POA: Diagnosis not present

## 2019-12-31 DIAGNOSIS — H40013 Open angle with borderline findings, low risk, bilateral: Secondary | ICD-10-CM | POA: Diagnosis not present

## 2019-12-31 DIAGNOSIS — Z9889 Other specified postprocedural states: Secondary | ICD-10-CM | POA: Diagnosis not present

## 2019-12-31 DIAGNOSIS — Z961 Presence of intraocular lens: Secondary | ICD-10-CM | POA: Diagnosis not present

## 2020-01-27 NOTE — Progress Notes (Signed)
[place] Phone: [phone] Subjective:   Daniel Daniel [name], am serving as a scribe for [name]. This visit occurred during the SARS-CoV-2 public health emergency.  Safety protocols were in place, including screening questions prior to the visit, additional usage of staff PPE, and extensive cleaning of exam room while observing appropriate contact time as indicated for disinfecting solutions.   I'm seeing this patient by the request  of:  Patient, Daniel [name]  CC: Back and neck pain follow-up  [id number]  Daniel Daniel [name] is a 69 y.o. male coming in with complaint of back and neck pain. OMT 12/17/2019. Patient states that he cut down a tree yesterday. Is having increase in pain in right lower back sine then. Antalgic gait. Using Trazodone and Zanaflex which did not help his pain.   Medications patient has been prescribed: Amitriptyline, hydroxyzine, Zanaflex, trazodone  Taking: Intermittently         Reviewed prior external information including notes and imaging from previsou exam, outside providers and external EMR if available.   As well as notes that were available from care everywhere and other healthcare systems.  Past medical history, social, surgical and family history all reviewed in electronic medical record.  Daniel [name] unless stated regarding to the chief complaint.   Past Medical History:  Diagnosis Date  . Acne   . Anxiety   . DDD (degenerative disc disease), lumbosacral    chronic back pain  . Diverticulosis of colon (without mention of hemorrhage)    colo 09/2010  . Low back pain radiating to both legs     Allergies  Allergen Reactions  . Durezol [Difluprednate]     Eye drops-swelling     Review of Systems:  Daniel [name] headache, visual changes, nausea, vomiting, diarrhea, constipation, dizziness, abdominal pain, skin rash, fevers, chills, night sweats, weight loss,  swollen lymph nodes, body aches, joint swelling, chest pain, shortness of breath, mood changes. POSITIVE muscle aches  Objective  There were Daniel [name] vitals taken for this visit.   General: Daniel [name] alert and oriented x3 mood and affect normal, dressed appropriately.  HEENT: Pupils equal, extraocular movements intact  Respiratory: Patient's speak in full sentences and does not appear short of breath  Cardiovascular: Daniel [name] lower extremity edema, non tender, Daniel [name] erythema  Neuro: Cranial nerves II through XII are intact, neurovascularly intact in all extremities with 2+ DTRs and 2+ pulses.  Gait normal with good balance and coordination.  MSK:  Non tender with full range of motion and good stability and symmetric strength and tone of shoulders, elbows, wrist, hip, knee and ankles bilaterally.  Back -low back exam does have some mild loss of lordosis, tightness with FABER test bilaterally, negative straight leg test. Patient has limited range of motion of 10 degrees with sidebending bilaterally and only 15 degrees of extension.  Tightness of the hamstrings with forward flexion.  Osteopathic findings  C6 flexed rotated and side bent left T8 extended rotated and side bent right inhaled rib L4 flexed rotated and side bent left Sacrum right on right       Assessment and Plan:  Low back pain is multifactorial, chronic, with mild exacerbation, discussed medications including Zanaflex and amitriptyline.  Has responded to Toradol and Depo-Medrol in the past.  Do not feel that advancing would change in medical management at this time running fairly well to osteopathic manipulation.  Follow-up again in 6 to 8 weeks  Nonallopathic problems  Decision today to treat with OMT was based on Physical Exam  After verbal consent patient was treated with HVLA, ME, FPR techniques in cervical, rib, thoracic, lumbar, and sacral  areas  Patient tolerated the procedure well with improvement in  symptoms  Patient given exercises, stretches and lifestyle modifications  See medications in patient instructions if given  Patient will follow up in 6-8 weeks      The above documentation has been reviewed and is accurate and complete Lyndal Pulley, DO       Note: This dictation was prepared with Dragon dictation along with smaller phrase technology. Any transcriptional errors that result from this process are unintentional.

## 2020-01-28 ENCOUNTER — Ambulatory Visit (INDEPENDENT_AMBULATORY_CARE_PROVIDER_SITE_OTHER): Payer: Medicare Other | Admitting: Family Medicine

## 2020-01-28 ENCOUNTER — Encounter: Payer: Self-pay | Admitting: Family Medicine

## 2020-01-28 ENCOUNTER — Other Ambulatory Visit: Payer: Self-pay

## 2020-01-28 VITALS — BP 122/88 | HR 78 | Ht 69.0 in | Wt 218.0 lb

## 2020-01-28 DIAGNOSIS — M999 Biomechanical lesion, unspecified: Secondary | ICD-10-CM | POA: Diagnosis not present

## 2020-01-28 NOTE — Patient Instructions (Signed)
Careful with next tree Call me if not better in a couple days See me in 6 weeks

## 2020-03-02 ENCOUNTER — Encounter: Payer: Self-pay | Admitting: Family Medicine

## 2020-03-02 ENCOUNTER — Other Ambulatory Visit: Payer: Self-pay

## 2020-03-02 ENCOUNTER — Ambulatory Visit (INDEPENDENT_AMBULATORY_CARE_PROVIDER_SITE_OTHER): Payer: Medicare Other | Admitting: Family Medicine

## 2020-03-02 VITALS — BP 112/78 | HR 77 | Ht 69.0 in | Wt 215.0 lb

## 2020-03-02 DIAGNOSIS — M5137 Other intervertebral disc degeneration, lumbosacral region: Secondary | ICD-10-CM

## 2020-03-02 DIAGNOSIS — M999 Biomechanical lesion, unspecified: Secondary | ICD-10-CM | POA: Diagnosis not present

## 2020-03-02 DIAGNOSIS — M51379 Other intervertebral disc degeneration, lumbosacral region without mention of lumbar back pain or lower extremity pain: Secondary | ICD-10-CM

## 2020-03-02 MED ORDER — TRAZODONE HCL 50 MG PO TABS: ORAL_TABLET | ORAL | 2 refills | Status: DC

## 2020-03-02 NOTE — Patient Instructions (Signed)
Be careful knocking down walls Stretch afterwards See me in 4-5 weeks

## 2020-03-02 NOTE — Assessment & Plan Note (Signed)
Increasing tightness recently.  Patient has been doing a lot more manual labor, likely contributing to more the aches and pains.  Discussed icing regimen and home exercises, which activities to do which wants to avoid.  Increase activity slowly.  Follow-up with me again in 4 to 8 weeks

## 2020-03-02 NOTE — Progress Notes (Signed)
[place] Phone: [phone] Subjective:   Daniel Daniel [name], am serving as a scribe for [name]. This visit occurred during the SARS-CoV-2 public health emergency.  Safety protocols were in place, including screening questions prior to the visit, additional usage of staff PPE, and extensive cleaning of exam room while observing appropriate contact time as indicated for disinfecting solutions.   I'm seeing this patient by the request  of:  Patient, Daniel [name]  CC: Low back pain follow-up  [id number]  Daniel Daniel [name] is a 69 y.o. male coming in with complaint of back and neck pain. OMT 01/28/2020. Patient states that his back "gave out" yesterday. States that he had a fever and chills yesterday. Patient feels that his diverticulitis is acting up since yesterday.   Medications patient has been prescribed: Trazodone, amitriptyline, hydroxyzine  Taking: All but the trazodone and wants a refill not sleeping well.         Reviewed prior external information including notes and imaging from previsou exam, outside providers and external EMR if available.   As well as notes that were available from care everywhere and other healthcare systems.  Past medical history, social, surgical and family history all reviewed in electronic medical record.  Daniel [name] unless stated regarding to the chief complaint.   Past Medical History:  Diagnosis Date  . Acne   . Anxiety   . DDD (degenerative disc disease), lumbosacral    chronic back pain  . Diverticulosis of colon (without mention of hemorrhage)    colo 09/2010  . Low back pain radiating to both legs     Allergies  Allergen Reactions  . Durezol [Difluprednate]     Eye drops-swelling     Review of Systems:  Daniel [name] headache, visual changes, nausea, vomiting, diarrhea, constipation, dizziness, abdominal pain, skin rash, fevers, chills, night sweats,  weight loss, swollen lymph nodes, body aches, joint swelling, chest pain, shortness of breath, mood changes. POSITIVE muscle aches  Objective  There were Daniel [name] vitals taken for this visit.   General: Daniel [name] alert and oriented x3 mood and affect normal, dressed appropriately.  HEENT: Pupils equal, extraocular movements intact  Respiratory: Patient's speak in full sentences and does not appear short of breath  Cardiovascular: Daniel [name] lower extremity edema, non tender, Daniel [name] erythema  Neuro: Cranial nerves II through XII are intact, neurovascularly intact in all extremities with 2+ DTRs and 2+ pulses.  Gait normal with good balance and coordination.  MSK:  Non tender with full range of motion and good stability and symmetric strength and tone of shoulders, elbows, wrist, hip, knee and ankles bilaterally.  Back -low back exam does show significant tightness from patient's baseline.  Does have tightness with FABER test right greater than left as well.  Patient does have a negative straight leg test.  Does have tightness of the hamstrings.  Tightness of Faber bilaterally.  Osteopathic findings  C6 flexed rotated and side bent right T3 extended rotated and side bent right inhaled rib L1 flexed rotated and side bent right Sacrum right on right       Assessment and Plan:  DDD (degenerative disc disease), lumbosacral Increasing tightness recently.  Patient has been doing a lot more manual labor, likely contributing to more the aches and pains.  Discussed icing regimen and home exercises, which activities to do which wants to avoid.  Increase activity slowly.  Follow-up with me again in  4 to 8 weeks   Nonallopathic problems  Decision today to treat with OMT was based on Physical Exam  After verbal consent patient was treated with HVLA, ME, FPR techniques in cervical, rib, thoracic, lumbar, and sacral  areas  Patient tolerated the procedure well with improvement in symptoms  Patient  given exercises, stretches and lifestyle modifications  See medications in patient instructions if given  Patient will follow up in 4-8 weeks      The above documentation has been reviewed and is accurate and complete Lyndal Pulley, DO       Note: This dictation was prepared with Dragon dictation along with smaller phrase technology. Any transcriptional errors that result from this process are unintentional.

## 2020-03-30 NOTE — Progress Notes (Signed)
[place] Phone: [phone] Subjective:   Daniel Daniel [name], am serving as a scribe for [name]. This visit occurred during the SARS-CoV-2 public health emergency.  Safety protocols were in place, including screening questions prior to the visit, additional usage of staff PPE, and extensive cleaning of exam room while observing appropriate contact time as indicated for disinfecting solutions.  I'm seeing this patient by the request  of:  Patient, Daniel [name]  CC: Neck and back pain follow-up  [id number]  Daniel Daniel [name] is a 69 y.o. male coming in with Daniel [name] of back and neck pain. OMT 03/02/2020. Patient states that he had increase in pain due to lifting a lot of furniture. Pain subsided over the weekend.  States that it was definitely secondary to moving a significant amount of furniture.  Improving.  States very tired when going from a seated to standing position  Medications patient has been prescribed: Trazadone  Taking: Yes         Reviewed prior external information including notes and imaging from previsou exam, outside providers and external EMR if available.   As well as notes that were available from care everywhere and other healthcare systems.  Past medical history, social, surgical and family history all reviewed in electronic medical record.  Daniel Daniel [name].   Past Medical History:  Diagnosis Date  . Acne   . Anxiety   . DDD (degenerative disc disease), lumbosacral    chronic back pain  . Diverticulosis of colon (without mention of hemorrhage)    colo 09/2010  . Low back pain radiating to both legs     Allergies  Allergen Reactions  . Durezol [Difluprednate]     Eye drops-swelling     Review of Systems:  Daniel [name] headache, visual changes, nausea, vomiting, diarrhea, constipation, dizziness, abdominal pain, skin rash,  fevers, chills, night sweats, weight loss, swollen lymph nodes, body aches, joint swelling, chest pain, shortness of breath, mood changes. POSITIVE muscle aches  Objective  Blood pressure 128/82, pulse (!) 53, height 5\' 9"  (1.753 m), weight 216 lb (98 kg), SpO2 97 %.   General: Daniel [name] alert and oriented x3 mood and affect normal, dressed appropriately.  HEENT: Pupils equal, extraocular movements intact  Respiratory: Patient's speak in full sentences and does not appear short of breath  Cardiovascular: Daniel [name] lower extremity edema, non tender, Daniel [name] erythema  Back -antalgic walking noted.  Patient does have significant tightness of the lumbar spine with decreased range of motion in all planes.  Significant tightness with Corky Sox.  Tightness with straight leg test.  Daniel [name] tenderness over the greater trochanteric area.  Daniel [name] spinous process tenderness.  Neurovascularly intact distally.  Osteopathic findings  C7 flexed rotated and side bent right T6 extended rotated and side bent right inhaled rib L1 flexed rotated and side bent right L5 flexed rotated and side bent left Sacrum right on right       Assessment and Plan:   DDD (degenerative disc disease), lumbosacral Increased redness of the lower back.  Patient does have significant amount of tightness noted today compared to patient's baseline.  Chronic problem with exacerbation.  Discussed posture and ergonomics.  Discussed the icing.  Discussed we could potentially change different medications but patient wants to keep where we are at this time.  Patient has the Zanaflex and the trazodone.  Follow-up again 4 to 6 weeks  Nonallopathic problems  Decision today to treat with OMT was based on Physical Exam  After verbal consent patient was treated with HVLA, ME, FPR techniques in cervical, rib, thoracic, lumbar, and sacral  areas  Patient tolerated the procedure well with improvement in symptoms  Patient given exercises, stretches  and lifestyle modifications  See medications in patient instructions if given  Patient will follow up in 4-8 weeks      The above documentation has been reviewed and is accurate and complete Lyndal Pulley, DO       Note: This dictation was prepared with Dragon dictation along with smaller phrase technology. Any transcriptional errors that result from this process are unintentional.

## 2020-03-31 ENCOUNTER — Ambulatory Visit (INDEPENDENT_AMBULATORY_CARE_PROVIDER_SITE_OTHER): Payer: Medicare Other | Admitting: Family Medicine

## 2020-03-31 ENCOUNTER — Other Ambulatory Visit: Payer: Self-pay

## 2020-03-31 ENCOUNTER — Encounter: Payer: Self-pay | Admitting: Family Medicine

## 2020-03-31 VITALS — BP 128/82 | HR 53 | Ht 69.0 in | Wt 216.0 lb

## 2020-03-31 DIAGNOSIS — M999 Biomechanical lesion, unspecified: Secondary | ICD-10-CM | POA: Diagnosis not present

## 2020-03-31 DIAGNOSIS — M5137 Other intervertebral disc degeneration, lumbosacral region: Secondary | ICD-10-CM | POA: Diagnosis not present

## 2020-03-31 NOTE — Assessment & Plan Note (Signed)
Increased redness of the lower back.  Patient does have significant amount of tightness noted today compared to patient's baseline.  Chronic problem with exacerbation.  Discussed posture and ergonomics.  Discussed the icing.  Discussed we could potentially change different medications but patient wants to keep where we are at this time.  Patient has the Zanaflex and the trazodone.  Follow-up again 4 to 6 weeks

## 2020-03-31 NOTE — Patient Instructions (Signed)
Happy [place]idays Careful with outdoor furniture Stretch and ice after moving See me again in 4 weeks

## 2020-04-07 DIAGNOSIS — Z23 Encounter for immunization: Secondary | ICD-10-CM | POA: Diagnosis not present

## 2020-04-14 ENCOUNTER — Telehealth: Payer: Self-pay

## 2020-04-14 NOTE — Telephone Encounter (Signed)
Patient calling stating that he is having really bad back pain and wants to talk to someone about what he can do for the pain. States he has been taking ibuprofen 500mg  6-8 tablets at a time and it helped but that is way too much to be taking.

## 2020-04-14 NOTE — Telephone Encounter (Signed)
Way Way too much anti-inflammatory and needs to stop  I could do the prednisone 40 mg daily for 5 days. If he has to check blood sugars though it could make it go up.  If severe worsening pain would need to see someone this holiday weekend. Would recommend urgent care for labs and xray if severe. Otherwise we can try to see him next week

## 2020-04-14 NOTE — Telephone Encounter (Signed)
Talked to patient. States that next time he will call the office first before taking any other meds on his next flare.

## 2020-04-27 NOTE — Progress Notes (Signed)
[name] [place] Phone: [phone] Subjective:   Daniel [name], am serving as a scribe for [name]. This visit occurred during the SARS-CoV-2 public health emergency.  Safety protocols were in place, including screening questions prior to the visit, additional usage of staff PPE, and extensive cleaning of exam room while observing appropriate contact time as indicated for disinfecting solutions.   I'm seeing this patient by the request  of:  Patient, No Pcp Per  CC: Neck and back pain follow-up  [id number]  Daniel [name] is a 69 y.o. male coming in with complaint of back and neck pain. OMT 03/30/2020. Patient states that he has had a rough two weeks and had started taking way too much Anti-inflammatory medication trying to treat the pain. Patient states that he was not aware of the tizanidine and does he need to take it.    Medications patient has been prescribed: Elavil, Trazadone, Zanaflex  Taking: Elavil; Trazadone          Reviewed prior external information including notes and imaging from previsou exam, outside providers and external EMR if available.   As well as notes that were available from care everywhere and other healthcare systems.  Past medical history, social, surgical and family history all reviewed in electronic medical record.  No pertanent information unless stated regarding to the chief complaint.   Past Medical History:  Diagnosis Date  . Acne   . Anxiety   . DDD (degenerative disc disease), lumbosacral    chronic back pain  . Diverticulosis of colon (without mention of hemorrhage)    colo 09/2010  . Low back pain radiating to both legs     Allergies  Allergen Reactions  . Durezol [Difluprednate]     Eye drops-swelling     Review of Systems:  No headache, visual changes, nausea, vomiting, diarrhea, constipation, dizziness, abdominal pain, skin rash, fevers, chills,  night sweats, weight loss, swollen lymph nodes, body aches, joint swelling, chest pain, shortness of breath, mood changes. POSITIVE muscle aches  Objective  Blood pressure 124/80, pulse 71, height 5\' 9"  (1.753 m), weight 212 lb (96.2 kg), SpO2 96 %.   General: No apparent distress alert and oriented x3 mood and affect normal, dressed appropriately.  HEENT: Pupils equal, extraocular movements intact  Respiratory: Patient's speak in full sentences and does not appear short of breath  Cardiovascular: No lower extremity edema, non tender, no erythema  Gait normal with good balance and coordination.  MSK:   Back - back loss of lordosis with tightness noted. Mild Limited ROM in all planes.  Patient does have tightness with straight leg test and FABER test more than usual as well.  Neurovascularly intact distally.  No numbness noted.   Osteopathic findings  C7 flexed rotated and side bent left T3 extended rotated and side bent right inhaled rib T8 extended rotated and side bent left L4 flexed rotated and side bent left  Sacrum right on right      Assessment and Plan:  DDD (degenerative disc disease), lumbosacral Chronic problem with mild worsening.  Prednisone given today.  We discussed with patient about new x-rays.  Laboratory work-up with patient not seen primary care for the last 3 years and doing to get a PSA.  Patient has had some more increasing tightness than what patient has had previously.  Discussed home exercises and icing regimen.  Patient will follow up with me otherwise in 4 to  5 weeks.    Nonallopathic problems  Decision today to treat with OMT was based on Physical Exam  After verbal consent patient was treated with HVLA, ME, FPR techniques in cervical, rib, thoracic, lumbar, and sacral  areas  Patient tolerated the procedure well with improvement in symptoms  Patient given exercises, stretches and lifestyle modifications  See medications in patient instructions if  given  Patient will follow up in 4-8 weeks      The above documentation has been reviewed and is accurate and complete Lyndal Pulley, DO       Note: This dictation was prepared with Dragon dictation along with smaller phrase technology. Any transcriptional errors that result from this process are unintentional.

## 2020-04-28 ENCOUNTER — Encounter: Payer: Self-pay | Admitting: Family Medicine

## 2020-04-28 ENCOUNTER — Ambulatory Visit (INDEPENDENT_AMBULATORY_CARE_PROVIDER_SITE_OTHER): Payer: Medicare Other | Admitting: Family Medicine

## 2020-04-28 ENCOUNTER — Ambulatory Visit (INDEPENDENT_AMBULATORY_CARE_PROVIDER_SITE_OTHER): Payer: Medicare Other

## 2020-04-28 ENCOUNTER — Other Ambulatory Visit: Payer: Self-pay

## 2020-04-28 VITALS — BP 124/80 | HR 71 | Ht 69.0 in | Wt 212.0 lb

## 2020-04-28 DIAGNOSIS — M2550 Pain in unspecified joint: Secondary | ICD-10-CM | POA: Diagnosis not present

## 2020-04-28 DIAGNOSIS — M51379 Other intervertebral disc degeneration, lumbosacral region without mention of lumbar back pain or lower extremity pain: Secondary | ICD-10-CM

## 2020-04-28 DIAGNOSIS — E559 Vitamin D deficiency, unspecified: Secondary | ICD-10-CM

## 2020-04-28 DIAGNOSIS — M999 Biomechanical lesion, unspecified: Secondary | ICD-10-CM | POA: Diagnosis not present

## 2020-04-28 DIAGNOSIS — R972 Elevated prostate specific antigen [PSA]: Secondary | ICD-10-CM | POA: Diagnosis not present

## 2020-04-28 DIAGNOSIS — M5137 Other intervertebral disc degeneration, lumbosacral region: Secondary | ICD-10-CM

## 2020-04-28 DIAGNOSIS — M545 Low back pain, unspecified: Secondary | ICD-10-CM | POA: Diagnosis not present

## 2020-04-28 DIAGNOSIS — E038 Other specified hypothyroidism: Secondary | ICD-10-CM

## 2020-04-28 LAB — CBC WITH DIFFERENTIAL/PLATELET
Basophils Absolute: 0.1 10*3/uL (ref 0.0–0.1)
Basophils Relative: 1.1 % (ref 0.0–3.0)
Eosinophils Absolute: 0.1 10*3/uL (ref 0.0–0.7)
Eosinophils Relative: 1.8 % (ref 0.0–5.0)
HCT: 47.8 % (ref 39.0–52.0)
Hemoglobin: 16.1 g/dL (ref 13.0–17.0)
Lymphocytes Relative: 44 % (ref 12.0–46.0)
Lymphs Abs: 2.3 10*3/uL (ref 0.7–4.0)
MCHC: 33.6 g/dL (ref 30.0–36.0)
MCV: 91.8 fl (ref 78.0–100.0)
Monocytes Absolute: 0.4 10*3/uL (ref 0.1–1.0)
Monocytes Relative: 8.8 % (ref 3.0–12.0)
Neutro Abs: 2.3 10*3/uL (ref 1.4–7.7)
Neutrophils Relative %: 44.3 % (ref 43.0–77.0)
Platelets: 237 10*3/uL (ref 150.0–400.0)
RBC: 5.2 Mil/uL (ref 4.22–5.81)
RDW: 13.5 % (ref 11.5–15.5)
WBC: 5.1 10*3/uL (ref 4.0–10.5)

## 2020-04-28 LAB — COMPREHENSIVE METABOLIC PANEL
ALT: 18 U/L (ref 0–53)
AST: 20 U/L (ref 0–37)
Albumin: 4.3 g/dL (ref 3.5–5.2)
Alkaline Phosphatase: 55 U/L (ref 39–117)
BUN: 14 mg/dL (ref 6–23)
CO2: 30 mEq/L (ref 19–32)
Calcium: 9.7 mg/dL (ref 8.4–10.5)
Chloride: 102 mEq/L (ref 96–112)
Creatinine, Ser: 0.92 mg/dL (ref 0.40–1.50)
GFR: 86.27 mL/min (ref >60.00)
Glucose, Bld: 96 mg/dL (ref 70–99)
Potassium: 4.1 mEq/L (ref 3.5–5.1)
Sodium: 138 mEq/L (ref 135–145)
Total Bilirubin: 0.6 mg/dL (ref 0.2–1.2)
Total Protein: 7.4 g/dL (ref 6.0–8.3)

## 2020-04-28 LAB — TSH: TSH: 1.32 u[IU]/mL (ref 0.35–4.50)

## 2020-04-28 LAB — SEDIMENTATION RATE: Sed Rate: 30 mm/hr — ABNORMAL HIGH (ref 0–20)

## 2020-04-28 LAB — URIC ACID: Uric Acid, Serum: 5.4 mg/dL (ref 4.0–7.8)

## 2020-04-28 MED ORDER — PREDNISONE 20 MG PO TABS: 20.0000 mg | ORAL_TABLET | Freq: Every day | ORAL | 0 refills | Status: DC

## 2020-04-28 NOTE — Patient Instructions (Addendum)
Good to see you  Prednisone to keep on hand if needed Labs on your way out Xray on your way out [place] See me again in 4-5 weeks

## 2020-04-28 NOTE — Assessment & Plan Note (Signed)
Chronic problem with mild worsening.  Prednisone given today.  We discussed with patient about new x-rays.  Laboratory work-up with patient not seen primary care for the last 3 years and doing to get a PSA.  Patient has had some more increasing tightness than what patient has had previously.  Discussed home exercises and icing regimen.  Patient will follow up with me otherwise in 4 to 5 weeks.

## 2020-05-02 LAB — VITAMIN D 1,25 DIHYDROXY
Vitamin D 1, 25 (OH)2 Total: 29 pg/mL (ref 18–72)
Vitamin D2 1, 25 (OH)2: <8 pg/mL
Vitamin D3 1, 25 (OH)2: 29 pg/mL

## 2020-05-02 LAB — PSA, TOTAL AND FREE
PSA, % Free: 24 % (calc) — ABNORMAL LOW (ref >25)
PSA, Free: 0.5 ng/mL
PSA, Total: 2.1 ng/mL (ref <=4.0)

## 2020-05-13 DIAGNOSIS — Z23 Encounter for immunization: Secondary | ICD-10-CM | POA: Diagnosis not present

## 2020-05-18 DIAGNOSIS — Z20828 Contact with and (suspected) exposure to other viral communicable diseases: Secondary | ICD-10-CM | POA: Diagnosis not present

## 2020-05-18 DIAGNOSIS — M791 Myalgia, unspecified site: Secondary | ICD-10-CM | POA: Diagnosis not present

## 2020-05-29 NOTE — Progress Notes (Signed)
[place] Phone: [phone] Subjective:   Daniel Daniel [name], am serving as a scribe for [name]. This visit occurred during the SARS-CoV-2 public health emergency.  Safety protocols were in place, including screening questions prior to the visit, additional usage of staff PPE, and extensive cleaning of exam room while observing appropriate contact time as indicated for disinfecting solutions.   I'm seeing this patient by the request  of:  Patient, Daniel [name]  CC: Back and neck pain follow-up  [id number]  Daniel Daniel [name] is a 70 y.o. male coming in with complaint of back and neck pain. OMT 04/28/2020. Patient states that he moved and he he has done well.  Patient states overall feeling relatively well.  Not take anything for pain at this time.  Patient has been able to lose some weight and feels like it is needed fairly large improvement as well.  Patient denies any radiation to any of the extremities  Medications patient has been prescribed: Pred, Trazadone         Reviewed prior external information including notes and imaging from previsou exam, outside providers and external EMR if available.   As well as notes that were available from care everywhere and other healthcare systems.  Past medical history, social, surgical and family history all reviewed in electronic medical record.  Daniel [name] unless stated regarding to the chief complaint.   Past Medical History:  Diagnosis Date   Acne    Anxiety    DDD (degenerative disc disease), lumbosacral    chronic back pain   Diverticulosis of colon (without mention of hemorrhage)    colo 09/2010   Low back pain radiating to both legs     Allergies  Allergen Reactions   Durezol [Difluprednate]     Eye drops-swelling     Review of Systems:  Daniel [name] headache, visual changes, nausea, vomiting, diarrhea, constipation, dizziness,  abdominal pain, skin rash, fevers, chills, night sweats, weight loss, swollen lymph nodes, joint swelling, chest pain, shortness of breath, mood changes. POSITIVE muscle aches, body aches  Objective  Blood pressure 120/80, pulse 69, height 5\' 9"  (1.753 m), weight 209 lb (94.8 kg), SpO2 97 %.   General: Daniel [name] alert and oriented x3 mood and affect normal, dressed appropriately.  HEENT: Pupils equal, extraocular movements intact  Respiratory: Patient's speak in full sentences and does not appear short of breath  Cardiovascular: Daniel [name] lower extremity edema, non tender, Daniel [name] erythema  Back -low back mild loss of lordosis.  Less tender than previous.  Still tightness with FABER test and straight leg test but Daniel [name] radicular symptoms.  Diffuse tenderness over the paraspinal musculature of the lumbar spine right greater than left. Neck exam does have some loss of sidebending bilaterally right greater than left.  Negative Spurling's though.  5 out of 5 strength of the upper extremities  Osteopathic findings  C2 flexed rotated and side bent right C6 flexed rotated and side bent left T3 extended rotated and side bent right inhaled rib T9 extended rotated and side bent left L2 flexed rotated and side bent right Sacrum right on right       Assessment and Plan:   DDD (degenerative disc disease), lumbosacral Chronic problem but stable at this point.  Patient has not taken any medicine for pain at this time.  Has had prescriptions over the Zanaflex and can use it as needed.  Discussed icing regimen.  Patient will continue with the weight loss and home exercises and follow-up again in 6 to 8 weeks  Nonallopathic problems  Decision today to treat with OMT was based on Physical Exam  After verbal consent patient was treated with HVLA, ME, FPR techniques in cervical, rib, thoracic, lumbar, and sacral  areas  Patient tolerated the procedure well with improvement in symptoms  Patient given  exercises, stretches and lifestyle modifications  See medications in patient instructions if given  Patient will follow up in 4-8 weeks     The above documentation has been reviewed and is accurate and complete Lyndal Pulley, DO       Note: This dictation was prepared with Dragon dictation along with smaller phrase technology. Any transcriptional errors that result from this process are unintentional.

## 2020-06-01 ENCOUNTER — Ambulatory Visit (INDEPENDENT_AMBULATORY_CARE_PROVIDER_SITE_OTHER): Payer: Medicare Other | Admitting: Family Medicine

## 2020-06-01 ENCOUNTER — Other Ambulatory Visit: Payer: Self-pay

## 2020-06-01 ENCOUNTER — Encounter: Payer: Self-pay | Admitting: Family Medicine

## 2020-06-01 VITALS — BP 120/80 | HR 69 | Ht 69.0 in | Wt 209.0 lb

## 2020-06-01 DIAGNOSIS — M999 Biomechanical lesion, unspecified: Secondary | ICD-10-CM

## 2020-06-01 DIAGNOSIS — M5137 Other intervertebral disc degeneration, lumbosacral region: Secondary | ICD-10-CM

## 2020-06-01 NOTE — Assessment & Plan Note (Signed)
Chronic problem but stable at this point.  Patient has not taken any medicine for pain at this time.  Has had prescriptions over the Zanaflex and can use it as needed.  Discussed icing regimen.  Patient will continue with the weight loss and home exercises and follow-up again in 6 to 8 weeks

## 2020-06-01 NOTE — Patient Instructions (Signed)
See me again in 6-8 weeks Good to see ya Glad the boxes didn't kill ya

## 2020-07-24 NOTE — Progress Notes (Signed)
Daniel [name] [address] [place] Phone: [phone] Subjective:   I Daniel [name] am serving as a Education administrator for [name].  This visit occurred during the SARS-CoV-2 public health emergency.  Safety protocols were in place, including screening questions prior to the visit, additional usage of staff PPE, and extensive cleaning of exam room while observing appropriate contact time as indicated for disinfecting solutions.   I'm seeing this patient by the request  of:  Patient, No Pcp Per  CC: Low back pain  QMG:[id number]  Daniel [name] is a 70 y.o. male coming in with complaint of back and neck pain. OMT 06/01/2020. Patient states he has been working in the yard some so he is doing ok. States he did lots of bending.  More of a tightness.  Whenever he moves things for a long amount of time seems to get worse sometimes.  Wants to talk about recent labs.  Recent labs were fairly unremarkable.  Patient just wanted to make sure that they are okay.  Medications patient has been prescribed: Trazadone, Zanaflex  Taking: Intermittently         Reviewed prior external information including notes and imaging from previsou exam, outside providers and external EMR if available.   As well as notes that were available from care everywhere and other healthcare systems.  Past medical history, social, surgical and family history all reviewed in electronic medical record.  No pertanent information unless stated regarding to the chief complaint.   Past Medical History:  Diagnosis Date  . Acne   . Anxiety   . DDD (degenerative disc disease), lumbosacral    chronic back pain  . Diverticulosis of colon (without mention of hemorrhage)    colo 09/2010  . Low back pain radiating to both legs     Allergies  Allergen Reactions  . Durezol [Difluprednate]     Eye drops-swelling     Review of Systems:  No headache, visual changes, nausea, vomiting,  diarrhea, constipation, dizziness, abdominal pain, skin rash, fevers, chills, night sweats, weight loss, swollen lymph nodes,  joint swelling, chest pain, shortness of breath, mood changes. POSITIVE muscle aches, body aches  Objective  Blood pressure 140/82, pulse 70, height 5\' 9"  (1.753 m), weight 207 lb (93.9 kg), SpO2 98 %.   General: No apparent distress alert and oriented x3 mood and affect normal, dressed appropriately.  HEENT: Pupils equal, extraocular movements intact  Respiratory: Patient's speak in full sentences and does not appear short of breath  Cardiovascular: No lower extremity edema, non tender, no erythema  Gait normal with good balance and coordination.  MSK:  Non tender with full range of motion and good stability and symmetric strength and tone of shoulders, elbows, wrist, hip, knee and ankles bilaterally.  Back -low back exam does have some loss of lordosis.  Patient does have some tenderness to palpation of the paraspinal musculature of the lumbar spine right greater than left.  Tightness with FABER test.  Negative straight leg test.  Patient is mildly overweight.  Osteopathic findings  C2 flexed rotated and side bent right C6 flexed rotated and side bent left T3 extended rotated and side bent right inhaled rib T9 extended rotated and side bent left L2 flexed rotated and side bent right Sacrum right on right       Assessment and Plan:  DDD (degenerative disc disease), lumbosacral Chronic problem with mild exacerbation.  Was doing some yard work the other day  that likely contributed to some of the discomfort and pain.  Patient has muscle relaxer if necessary.  Discussed with core strengthening again.  Does respond well though to osteopathic manipulation.  Follow-up again in 4 to 6 weeks    Nonallopathic problems  Decision today to treat with OMT was based on Physical Exam  After verbal consent patient was treated with HVLA, ME, FPR techniques in cervical,  rib, thoracic, lumbar, and sacral  areas  Patient tolerated the procedure well with improvement in symptoms  Patient given exercises, stretches and lifestyle modifications  See medications in patient instructions if given  Patient will follow up in 4-8 weeks      The above documentation has been reviewed and is accurate and complete Daniel Pulley, DO       Note: This dictation was prepared with Dragon dictation along with smaller phrase technology. Any transcriptional errors that result from this process are unintentional.

## 2020-07-27 ENCOUNTER — Encounter: Payer: Self-pay | Admitting: Family Medicine

## 2020-07-27 ENCOUNTER — Other Ambulatory Visit: Payer: Self-pay

## 2020-07-27 ENCOUNTER — Ambulatory Visit (INDEPENDENT_AMBULATORY_CARE_PROVIDER_SITE_OTHER): Payer: Medicare Other | Admitting: Family Medicine

## 2020-07-27 VITALS — BP 140/82 | HR 70 | Ht 69.0 in | Wt 207.0 lb

## 2020-07-27 DIAGNOSIS — M5137 Other intervertebral disc degeneration, lumbosacral region: Secondary | ICD-10-CM

## 2020-07-27 DIAGNOSIS — M999 Biomechanical lesion, unspecified: Secondary | ICD-10-CM | POA: Diagnosis not present

## 2020-07-27 NOTE — Assessment & Plan Note (Signed)
Chronic problem with mild exacerbation.  Was doing some yard work the other day that likely contributed to some of the discomfort and pain.  Patient has muscle relaxer if necessary.  Discussed with core strengthening again.  Does respond well though to osteopathic manipulation.  Follow-up again in 4 to 6 weeks

## 2020-07-27 NOTE — Patient Instructions (Addendum)
Good to see you Try to stretch hip flexors after repetitive flexion of the back No changes in meds Good time to see Dr. Ronnald Ramp  See me again in 5-6 weeks

## 2020-09-03 NOTE — Progress Notes (Signed)
Daniel Daniel [name] [facility] Phone: [phone] Subjective:   Daniel Daniel [name], am serving as a scribe for [name]. This visit occurred during the SARS-CoV-2 public health emergency.  Safety protocols were in place, including screening questions prior to the visit, additional usage of staff PPE, and extensive cleaning of exam room while observing appropriate contact time as indicated for disinfecting solutions.   I'm seeing this patient by the request  of:  Patient, Daniel [name]  CC: back and neck pain.   [id number]  Daniel Daniel [name] is a 70 y.o. male coming in with complaint of back and neck pain. OMT 07/27/2020.Patient states that he has not had any changes since last visit. Is here for maintenance for his back pain.  Patient did have furniture market and responded actually better than he anticipated.  Not having as much significant discomfort and pain.  Medications patient has been prescribed: None          Reviewed prior external information including notes and imaging from previsou exam, outside providers and external EMR if available.   As well as notes that were available from care everywhere and other healthcare systems.  Past medical history, social, surgical and family history all reviewed in electronic medical record.  Daniel [name] unless stated regarding to the chief complaint.   Past Medical History:  Diagnosis Date  . Acne   . Anxiety   . DDD (degenerative disc disease), lumbosacral    chronic back pain  . Diverticulosis of colon (without mention of hemorrhage)    colo 09/2010  . Low back pain radiating to both legs     Allergies  Allergen Reactions  . Durezol [Difluprednate]     Eye drops-swelling     Review of Systems:  Daniel [name] headache, visual changes, nausea, vomiting, diarrhea, constipation, dizziness, abdominal pain, skin rash, fevers, chills, night sweats, weight loss,  swollen lymph nodes, body aches, joint swelling, chest pain, shortness of breath, mood changes. POSITIVE muscle aches  Objective  Blood pressure 112/80, pulse 60, height 5\' 9"  (1.753 m), weight 206 lb (93.4 kg), SpO2 98 %.   General: Daniel [name] alert and oriented x3 mood and affect normal, dressed appropriately.  HEENT: Pupils equal, extraocular movements intact  Respiratory: Patient's speak in full sentences and does not appear short of breath  Cardiovascular: Daniel [name] lower extremity edema, non tender, Daniel [name] erythema   Neck exam does have some loss of lordosis.  Some mild crepitus.  Lacks 10 degrees of sidebending bilaterally. Low back exam does have significant loss of lordosis.  Some tenderness to palpation paraspinal musculature.  Limited range of motion flexion.  Tightness noted of the [place].  Osteopathic findings  C2 flexed rotated and side bent right C6 flexed rotated and side bent left T3 extended rotated and side bent right inhaled rib T9 extended rotated and side bent left L2 flexed rotated and side bent right Sacrum right on right       Assessment and Plan:  DDD (degenerative disc disease), lumbosacral Mild increase in tightness that began today.  We discussed with patient about the medications.  Patient does have significant Type of breakthrough.  Patient does respond usually well to osteopathic manipulation.  Discussed icing regimen and home exercises.  Discussed avoiding certain lifting mechanics.  Follow-up with me again 6 weeks    Nonallopathic problems  Decision today to treat with OMT was based on Physical Exam  After verbal consent  patient was treated with HVLA, ME, FPR techniques in cervical, rib, thoracic, lumbar, and sacral  areas  Patient tolerated the procedure well with improvement in symptoms  Patient given exercises, stretches and lifestyle modifications  See medications in patient instructions if given  Patient will follow up in 4-8 weeks       The above documentation has been reviewed and is accurate and complete Lyndal Pulley, DO       Note: This dictation was prepared with Dragon dictation along with smaller phrase technology. Any transcriptional errors that result from this process are unintentional.

## 2020-09-08 ENCOUNTER — Other Ambulatory Visit: Payer: Self-pay

## 2020-09-08 ENCOUNTER — Ambulatory Visit (INDEPENDENT_AMBULATORY_CARE_PROVIDER_SITE_OTHER): Payer: Medicare Other | Admitting: Family Medicine

## 2020-09-08 ENCOUNTER — Encounter: Payer: Self-pay | Admitting: Family Medicine

## 2020-09-08 VITALS — BP 112/80 | HR 60 | Ht 69.0 in | Wt 206.0 lb

## 2020-09-08 DIAGNOSIS — M9902 Segmental and somatic dysfunction of thoracic region: Secondary | ICD-10-CM | POA: Diagnosis not present

## 2020-09-08 DIAGNOSIS — M9903 Segmental and somatic dysfunction of lumbar region: Secondary | ICD-10-CM | POA: Diagnosis not present

## 2020-09-08 DIAGNOSIS — M5137 Other intervertebral disc degeneration, lumbosacral region: Secondary | ICD-10-CM

## 2020-09-08 DIAGNOSIS — M9904 Segmental and somatic dysfunction of sacral region: Secondary | ICD-10-CM

## 2020-09-08 DIAGNOSIS — M9908 Segmental and somatic dysfunction of rib cage: Secondary | ICD-10-CM

## 2020-09-08 DIAGNOSIS — M9901 Segmental and somatic dysfunction of cervical region: Secondary | ICD-10-CM

## 2020-09-08 NOTE — Assessment & Plan Note (Signed)
Mild increase in tightness that began today.  We discussed with patient about the medications.  Patient does have significant Type of breakthrough.  Patient does respond usually well to osteopathic manipulation.  Discussed icing regimen and home exercises.  Discussed avoiding certain lifting mechanics.  Follow-up with me again 6 weeks

## 2020-09-08 NOTE — Patient Instructions (Signed)
See me again in 6 weeks 

## 2020-09-10 ENCOUNTER — Telehealth: Payer: Self-pay | Admitting: Family Medicine

## 2020-09-10 ENCOUNTER — Other Ambulatory Visit: Payer: Self-pay

## 2020-09-10 DIAGNOSIS — Z23 Encounter for immunization: Secondary | ICD-10-CM | POA: Diagnosis not present

## 2020-09-10 MED ORDER — HYDROXYZINE HCL 10 MG PO TABS
10.0000 mg | ORAL_TABLET | Freq: Three times a day (TID) | ORAL | 0 refills | Status: DC | PRN
Start: 2020-09-10 — End: 2020-10-08

## 2020-09-10 NOTE — Telephone Encounter (Signed)
Pt states at last visit that Dr. Tamala Julian was going to refill the hydroxyzine to [facility] on [address]

## 2020-09-10 NOTE — Telephone Encounter (Signed)
Rx refilled per a verbal from Dr. Tamala Julian. Patient taking medication QHS only.

## 2020-10-03 ENCOUNTER — Other Ambulatory Visit: Payer: Self-pay | Admitting: Family Medicine

## 2020-10-08 ENCOUNTER — Other Ambulatory Visit: Payer: Self-pay

## 2020-10-08 MED ORDER — HYDROXYZINE HCL 10 MG PO TABS
10.0000 mg | ORAL_TABLET | Freq: Three times a day (TID) | ORAL | 0 refills | Status: DC | PRN
Start: 2020-10-08 — End: 2021-11-25

## 2020-10-16 NOTE — Progress Notes (Signed)
[place] Phone: [phone] Subjective:   Daniel Daniel [name], am serving as a scribe for [name]. This visit occurred during the SARS-CoV-2 public health emergency.  Safety protocols were in place, including screening questions prior to the visit, additional usage of staff PPE, and extensive cleaning of exam room while observing appropriate contact time as indicated for disinfecting solutions.   I'm seeing this patient by the request  of:  Patient, Daniel [name]  CC: Low back pain follow-up  [id number]  Daniel Daniel [name] is a 70 y.o. male coming in with complaint of back and neck pain. OMT 09/08/2020. Patient states that he has had increase in sciatic nerve pain on L side since last visit. Has been taking prednisone and Biofreeze. Pain is improving but it isn't 100%.  Patient points to the lateral aspect of the hip is where more of the pain is.  Only thing new recently as patient has started to walk on a more regular basis.  Medications patient has been prescribed: Hydroxizine        Reviewed prior external information including notes and imaging from previsou exam, outside providers and external EMR if available.   As well as notes that were available from care everywhere and other healthcare systems.  Past medical history, social, surgical and family history all reviewed in electronic medical record.  Daniel [name] unless stated regarding to the chief complaint.   Past Medical History:  Diagnosis Date  . Acne   . Anxiety   . DDD (degenerative disc disease), lumbosacral    chronic back pain  . Diverticulosis of colon (without mention of hemorrhage)    colo 09/2010  . Low back pain radiating to both legs     Allergies  Allergen Reactions  . Durezol [Difluprednate]     Eye drops-swelling     Review of Systems:  Daniel [name] headache, visual changes, nausea, vomiting, diarrhea,  constipation, dizziness, abdominal pain, skin rash, fevers, chills, night sweats, weight loss, swollen lymph nodes, body aches, joint swelling, chest pain, shortness of breath, mood changes. POSITIVE muscle aches  Objective  Blood pressure 124/84, pulse 62, height 5\' 9"  (1.753 m), weight 208 lb (94.3 kg), SpO2 98 %.   General: Daniel [name] alert and oriented x3 mood and affect normal, dressed appropriately.  HEENT: Pupils equal, extraocular movements intact  Respiratory: Patient's speak in full sentences and does not appear short of breath  Cardiovascular: Daniel [name] lower extremity edema, non tender, Daniel [name] erythema  Gait normal with good balance and coordination.  MSK:  Non tender with full range of motion and good stability and symmetric strength and tone of shoulders, elbows, wrist, hip, knee and ankles bilaterally.  Back -low back exam does have some loss of lordosis.  Patient noted severely tender to palpation over the left greater trochanteric area.  Patient does have pain with FABER test.  Pain on the lateral aspect of the hip with internal range of motion but Daniel [name] groin pain.  Patient does have guarding noted  Osteopathic findings   C6 flexed rotated and side bent left T9 extended rotated and side bent left inhaled rib L2 flexed rotated and side bent right L4 flexed rotated and side bent left Sacrum right on right   After verbal consent patient was prepped with alcohol swab and with a 21-gauge 2 inch needle injected into the left greater trochanteric area with a total of 2 cc of 0.5%  Marcaine and 1 cc of Kenalog 40 mg/mL.  Daniel [name] blood loss.  Band-Aid placed.  Postinjection instructions given    Assessment and Plan:   Greater trochanteric bursitis of left hip Patient given injection and tolerated the procedure well.  Discussed icing regimen and home exercises.  Differential includes a lumbar radiculopathy.  Worsening pain will consider MRI of the back.  I do believe with patient recently  starting walking this is likely what caused exacerbation.  Follow-up with me again in 4 weeks  DDD (degenerative disc disease), lumbosacral Degenerative disc disease of the lumbar spine.  Chronic problem.  Could be contributing.  May need to consider advanced imaging with an MRI if the greater trochanteric injection does not seem to be helpful.  Hopeful that this will make a difference.  Follow-up with me again in 4 weeks    Nonallopathic problems  Decision today to treat with OMT was based on Physical Exam  After verbal consent patient was treated with HVLA, ME, FPR techniques in cervical, rib, thoracic, lumbar, and sacral  areas  Patient tolerated the procedure well with improvement in symptoms  Patient given exercises, stretches and lifestyle modifications  See medications in patient instructions if given  Patient will follow up in  weeks      The above documentation has been reviewed and is accurate and complete Lyndal Pulley, DO       Note: This dictation was prepared with Dragon dictation along with smaller phrase technology. Any transcriptional errors that result from this process are unintentional.

## 2020-10-20 ENCOUNTER — Ambulatory Visit (INDEPENDENT_AMBULATORY_CARE_PROVIDER_SITE_OTHER): Payer: Medicare Other | Admitting: Family Medicine

## 2020-10-20 ENCOUNTER — Ambulatory Visit (INDEPENDENT_AMBULATORY_CARE_PROVIDER_SITE_OTHER): Payer: Medicare Other

## 2020-10-20 ENCOUNTER — Encounter: Payer: Self-pay | Admitting: Family Medicine

## 2020-10-20 ENCOUNTER — Other Ambulatory Visit: Payer: Self-pay

## 2020-10-20 VITALS — BP 124/84 | HR 62 | Ht 69.0 in | Wt 208.0 lb

## 2020-10-20 DIAGNOSIS — M9908 Segmental and somatic dysfunction of rib cage: Secondary | ICD-10-CM

## 2020-10-20 DIAGNOSIS — M7062 Trochanteric bursitis, left hip: Secondary | ICD-10-CM | POA: Diagnosis not present

## 2020-10-20 DIAGNOSIS — M9901 Segmental and somatic dysfunction of cervical region: Secondary | ICD-10-CM

## 2020-10-20 DIAGNOSIS — M5137 Other intervertebral disc degeneration, lumbosacral region: Secondary | ICD-10-CM

## 2020-10-20 DIAGNOSIS — M9902 Segmental and somatic dysfunction of thoracic region: Secondary | ICD-10-CM | POA: Diagnosis not present

## 2020-10-20 DIAGNOSIS — M9904 Segmental and somatic dysfunction of sacral region: Secondary | ICD-10-CM

## 2020-10-20 DIAGNOSIS — M9903 Segmental and somatic dysfunction of lumbar region: Secondary | ICD-10-CM | POA: Diagnosis not present

## 2020-10-20 DIAGNOSIS — M25552 Pain in left hip: Secondary | ICD-10-CM | POA: Diagnosis not present

## 2020-10-20 NOTE — Patient Instructions (Signed)
Xray today Injected L hip See me in 6-8 weeks

## 2020-10-20 NOTE — Assessment & Plan Note (Signed)
Patient given injection and tolerated the procedure well.  Discussed icing regimen and home exercises.  Differential includes a lumbar radiculopathy.  Worsening pain will consider MRI of the back.  I do believe with patient recently starting walking this is likely what caused exacerbation.  Follow-up with me again in 4 weeks

## 2020-10-20 NOTE — Assessment & Plan Note (Signed)
Degenerative disc disease of the lumbar spine.  Chronic problem.  Could be contributing.  May need to consider advanced imaging with an MRI if the greater trochanteric injection does not seem to be helpful.  Hopeful that this will make a difference.  Follow-up with me again in 4 weeks

## 2020-11-04 ENCOUNTER — Other Ambulatory Visit: Payer: Self-pay

## 2020-11-04 ENCOUNTER — Telehealth: Payer: Self-pay | Admitting: Family Medicine

## 2020-11-04 MED ORDER — PREDNISONE 20 MG PO TABS: 20.0000 mg | ORAL_TABLET | Freq: Every day | ORAL | 0 refills | Status: DC

## 2020-11-04 NOTE — Telephone Encounter (Signed)
New Rx called in per a verbal from Dr. Tamala Julian. Patient notified.

## 2020-11-04 NOTE — Telephone Encounter (Signed)
Pt having hip pain, last injection 5/31 per pt. It helped for about a week. Pain seems more central, running down leg. He took 1 Prednisone that was previously prescribed, looking for advice on meds that might help since it is too soon for another injection. He did not schedule as he does not think OMT will help.

## 2020-12-04 NOTE — Progress Notes (Signed)
  [place] Phone: [phone] Subjective:   Daniel [name], am serving as a scribe for [name]. This visit occurred during the SARS-CoV-2 public health emergency.  Safety protocols were in place, including screening questions prior to the visit, additional usage of staff PPE, and extensive cleaning of exam room while observing appropriate contact time as indicated for disinfecting solutions.   I'm seeing this patient by the request  of:  Patient, [name]  CC: Low back pain follow-up  [id number]  Daniel [name] is a 70 y.o. male coming in with complaint of back and neck pain. OMT 10/20/2020. L hip injection last visit as well. Patient states that his L hip has been hurting but today it is not. On Sunday, he moved some furniture and pulled some weeds and then the next day he was unable to walk.  Patient states that he is concerned that this is his more daily activity.  Feels like he is becoming more reliant on medications to be able to do even daily activities.  Can wake him up at night as well.  Medications patient has been prescribed: Atarax, Prednisone  Xray L hip 10/20/2020 IMPRESSION: Diffuse osteopenia mild degenerative change. [name] acute abnormality identified.           Reviewed prior external information including notes and imaging from previsou exam, outside providers and external EMR if available.   As well as notes that were available from care everywhere and other healthcare systems.  Past medical history, social, surgical and family history all reviewed in electronic medical record.  [name] unless stated regarding to the chief complaint.   Past Medical History:  Diagnosis Date   Acne    Anxiety    DDD (degenerative disc disease), lumbosacral    chronic back pain   Diverticulosis of colon (without mention of hemorrhage)    colo 09/2010   Low back pain  radiating to both legs     Allergies  Allergen Reactions   Durezol [Difluprednate]     Eye drops-swelling     Review of Systems:  [name] headache, visual changes, nausea, vomiting, diarrhea, constipation, dizziness, abdominal pain, skin rash, fevers, chills, night sweats, weight loss, swollen lymph nodes, body aches, joint swelling, chest pain, shortness of breath, mood changes. POSITIVE muscle aches  Objective  Blood pressure 132/84, pulse 70, height 5\' 9"  (1.753 m), weight 210 lb (95.3 kg), SpO2 99 %.   General: [name] alert and oriented x3 mood and affect normal, dressed appropriately.  HEENT: Pupils equal, extraocular movements intact  Respiratory: Patient's speak in full sentences and does not appear short of breath  Cardiovascular: [name] lower extremity edema, non tender, [name] erythema  Low back exam shows significant loss of lordosis.  Tightness noted with straight leg test with some radicular symptoms in L5 corresponding bilaterally.  Worsening pain with extension of the back.  Neurovascularly intact distally well.  Deep tendon reflexes do appear to be intact        Assessment and Plan:        The above documentation has been reviewed and is accurate and complete Lyndal Pulley, DO        Note: This dictation was prepared with Dragon dictation along with smaller phrase technology. Any transcriptional errors that result from this process are unintentional.

## 2020-12-08 ENCOUNTER — Other Ambulatory Visit: Payer: Self-pay

## 2020-12-08 ENCOUNTER — Encounter: Payer: Self-pay | Admitting: Family Medicine

## 2020-12-08 ENCOUNTER — Ambulatory Visit (INDEPENDENT_AMBULATORY_CARE_PROVIDER_SITE_OTHER): Payer: Medicare Other | Admitting: Family Medicine

## 2020-12-08 VITALS — BP 132/84 | HR 70 | Ht 69.0 in | Wt 210.0 lb

## 2020-12-08 DIAGNOSIS — M5137 Other intervertebral disc degeneration, lumbosacral region: Secondary | ICD-10-CM

## 2020-12-08 DIAGNOSIS — M545 Low back pain, unspecified: Secondary | ICD-10-CM

## 2020-12-08 NOTE — Patient Instructions (Signed)
MRI Lumbar [id number] Consider epidural after MRI See me in 5-6 weeks We will chat once have results though

## 2020-12-08 NOTE — Assessment & Plan Note (Signed)
Worsening pain at this time with radicular symptoms.  Patient has failed different treatment options including muscle relaxers, gabapentin, as well as home exercises.  Feels like it is affecting more the daily activities and waking her up at night at this moment.  Do feel that the x-ray showed moderate degenerative changes that advanced imaging with an MRI official.  Last MRI was in 2012.  Likely some spinal stenosis and would be a candidate for.  Patient will be to follow-up with imaging we will discuss further.

## 2020-12-12 ENCOUNTER — Encounter: Payer: Self-pay | Admitting: Gastroenterology

## 2020-12-18 ENCOUNTER — Telehealth: Payer: Self-pay | Admitting: Family Medicine

## 2020-12-19 ENCOUNTER — Other Ambulatory Visit: Payer: Self-pay

## 2020-12-19 ENCOUNTER — Ambulatory Visit
Admission: RE | Admit: 2020-12-19 | Discharge: 2020-12-19 | Disposition: A | Discharge status: Home / Self Care | Payer: Medicare Other | Source: Ambulatory Visit | Attending: Family Medicine | Admitting: Family Medicine

## 2020-12-19 DIAGNOSIS — M545 Low back pain, unspecified: Secondary | ICD-10-CM | POA: Diagnosis not present

## 2020-12-19 DIAGNOSIS — M48061 Spinal stenosis, lumbar region without neurogenic claudication: Secondary | ICD-10-CM | POA: Diagnosis not present

## 2020-12-22 ENCOUNTER — Other Ambulatory Visit: Payer: Self-pay

## 2020-12-22 DIAGNOSIS — M5416 Radiculopathy, lumbar region: Secondary | ICD-10-CM

## 2020-12-22 NOTE — Progress Notes (Signed)
Spoke with patient. He would like to proceed with epidural. Ordered.

## 2020-12-28 ENCOUNTER — Ambulatory Visit
Admission: RE | Admit: 2020-12-28 | Discharge: 2020-12-28 | Disposition: A | Discharge status: Home / Self Care | Payer: Medicare Other | Source: Ambulatory Visit | Attending: Family Medicine | Admitting: Family Medicine

## 2020-12-28 DIAGNOSIS — M47817 Spondylosis without myelopathy or radiculopathy, lumbosacral region: Secondary | ICD-10-CM | POA: Diagnosis not present

## 2020-12-28 DIAGNOSIS — M5416 Radiculopathy, lumbar region: Secondary | ICD-10-CM

## 2020-12-28 MED ORDER — METHYLPREDNISOLONE ACETATE 40 MG/ML INJ SUSP (RADIOLOG
80.0000 mg | Freq: Once | INTRAMUSCULAR | Status: AC
  Administered 2020-12-28: 80 mg via EPIDURAL

## 2020-12-28 MED ORDER — IOPAMIDOL (ISOVUE-M 200) INJECTION 41%
1.0000 mL | Freq: Once | INTRAMUSCULAR | Status: AC
  Administered 2020-12-28: 1 mL via EPIDURAL

## 2020-12-28 MED ORDER — DIAZEPAM 5 MG PO TABS
5.0000 mg | ORAL_TABLET | Freq: Once | ORAL | Status: AC
  Administered 2020-12-28: 5 mg via ORAL

## 2020-12-28 NOTE — Discharge Instructions (Signed)

## 2021-01-04 DIAGNOSIS — H40013 Open angle with borderline findings, low risk, bilateral: Secondary | ICD-10-CM | POA: Diagnosis not present

## 2021-01-04 DIAGNOSIS — Z9889 Other specified postprocedural states: Secondary | ICD-10-CM | POA: Diagnosis not present

## 2021-01-04 DIAGNOSIS — H2511 Age-related nuclear cataract, right eye: Secondary | ICD-10-CM | POA: Diagnosis not present

## 2021-01-04 DIAGNOSIS — Z961 Presence of intraocular lens: Secondary | ICD-10-CM | POA: Diagnosis not present

## 2021-01-13 ENCOUNTER — Other Ambulatory Visit: Payer: Self-pay

## 2021-01-13 MED ORDER — AMITRIPTYLINE HCL 25 MG PO TABS: ORAL_TABLET | ORAL | 1 refills | Status: DC

## 2021-01-13 NOTE — Telephone Encounter (Signed)
Patient called asking why this medication was denied. Please advise.

## 2021-01-13 NOTE — Telephone Encounter (Signed)
Filled for patient.

## 2021-01-20 NOTE — Progress Notes (Signed)
Daniel [name] [place] [address] [place] Phone: [phone] Subjective:   IVilma [name], am serving as a scribe for [name]. This visit occurred during the SARS-CoV-2 public health emergency.  Safety protocols were in place, including screening questions prior to the visit, additional usage of staff PPE, and extensive cleaning of exam room while observing appropriate contact time as indicated for disinfecting solutions.   I'm seeing this patient by the request  of:  Patient, No Pcp Per (Inactive)  CC: Neck and back pain follow-up  [id number]  Daniel [name] is a 70 y.o. male coming in with complaint of back and neck pain. OMT 12/08/2020. Patient states he's doing well. No new complaints.  Patient was seen previously and was having some exacerbation and was sent for an MRI at the end of last month.  MRI was independently visualized by me.MRI did show the patient had multiple areas of nerve root impingement seem to be worse with more from L3-L5.  Attempted an L4-L5 epidural which patient states is helped out significantly with greater than 80 to 90% improvement in pain.  Has been able to do more activity already.  Mild tightness in the back but nothing severe.  Medications patient has been prescribed: Hydroxizine, Elavil  Taking:         Reviewed prior external information including notes and imaging from previsou exam, outside providers and external EMR if available.   As well as notes that were available from care everywhere and other healthcare systems.  Past medical history, social, surgical and family history all reviewed in electronic medical record.  No pertanent information unless stated regarding to the chief complaint.   Past Medical History:  Diagnosis Date   Acne    Anxiety    DDD (degenerative disc disease), lumbosacral    chronic back pain   Diverticulosis of colon (without mention of hemorrhage)    colo 09/2010    Low back pain radiating to both legs     Allergies  Allergen Reactions   Durezol [Difluprednate] Swelling    These eye drops cause swelling     Review of Systems:  No headache, visual changes, nausea, vomiting, diarrhea, constipation, dizziness, abdominal pain, skin rash, fevers, chills, night sweats, weight loss, swollen lymph nodes, body aches, joint swelling, chest pain, shortness of breath, mood changes. POSITIVE muscle aches  Objective  Blood pressure 120/72, pulse 64, height '5\' 9"'$  (1.753 m), weight 212 lb (96.2 kg), SpO2 99 %.   General: No apparent distress alert and oriented x3 mood and affect normal, dressed appropriately.  HEENT: Pupils equal, extraocular movements intact  Respiratory: Patient's speak in full sentences and does not appear short of breath  Cardiovascular: No lower extremity edema, non tender, no erythema  Low back exam does have some improvement in range of motion.  Patient does have low some mild tenderness noted in the paraspinal musculature right greater than left.  Patient does have tightness in the thoracolumbar spine right greater than left.  Tightness in the neck noted as well.  Very mild loss of extension of the neck.  Osteopathic findings   C6 flexed rotated and side bent left T3 extended rotated and side bent right inhaled rib T11 extended rotated and side bent left L2 flexed rotated and side bent right Sacrum right on right       Assessment and Plan:  DDD (degenerative disc disease), lumbosacral Patient has responded very well to the injection that he  had.  Patient is feeling much better.  Restarted osteopathic manipulation at the moment.  Discussed with patient we may need to repeat injection if necessary.  Discussed icing regimen.  Increase activity slowly.  Follow-up with me again 4 to 6 weeks   Nonallopathic problems  Decision today to treat with OMT was based on Physical Exam  After verbal consent patient was treated with HVLA,  ME, FPR techniques in cervical, rib, thoracic, lumbar, and sacral  areas  Patient tolerated the procedure well with improvement in symptoms  Patient given exercises, stretches and lifestyle modifications  See medications in patient instructions if given  Patient will follow up in 4-8 weeks      The above documentation has been reviewed and is accurate and complete Daniel Pulley, DO       Note: This dictation was prepared with Dragon dictation along with smaller phrase technology. Any transcriptional errors that result from this process are unintentional.

## 2021-01-21 ENCOUNTER — Other Ambulatory Visit: Payer: Self-pay

## 2021-01-21 ENCOUNTER — Encounter: Payer: Self-pay | Admitting: Family Medicine

## 2021-01-21 ENCOUNTER — Ambulatory Visit (INDEPENDENT_AMBULATORY_CARE_PROVIDER_SITE_OTHER): Payer: Medicare Other | Admitting: Family Medicine

## 2021-01-21 VITALS — BP 120/72 | HR 64 | Ht 69.0 in | Wt 212.0 lb

## 2021-01-21 DIAGNOSIS — M9902 Segmental and somatic dysfunction of thoracic region: Secondary | ICD-10-CM | POA: Diagnosis not present

## 2021-01-21 DIAGNOSIS — M9903 Segmental and somatic dysfunction of lumbar region: Secondary | ICD-10-CM | POA: Diagnosis not present

## 2021-01-21 DIAGNOSIS — M9908 Segmental and somatic dysfunction of rib cage: Secondary | ICD-10-CM

## 2021-01-21 DIAGNOSIS — M5137 Other intervertebral disc degeneration, lumbosacral region: Secondary | ICD-10-CM | POA: Diagnosis not present

## 2021-01-21 DIAGNOSIS — M9901 Segmental and somatic dysfunction of cervical region: Secondary | ICD-10-CM

## 2021-01-21 DIAGNOSIS — M9904 Segmental and somatic dysfunction of sacral region: Secondary | ICD-10-CM | POA: Diagnosis not present

## 2021-01-21 NOTE — Assessment & Plan Note (Signed)
Patient has responded very well to the injection that he had.  Patient is feeling much better.  Restarted osteopathic manipulation at the moment.  Discussed with patient we may need to repeat injection if necessary.  Discussed icing regimen.  Increase activity slowly.  Follow-up with me again 4 to 6 weeks

## 2021-01-21 NOTE — Patient Instructions (Signed)
Great to see you again Have fun in [place] you're doing better Repeat injections if necessary See you again in 4-6 weeks

## 2021-02-18 NOTE — Progress Notes (Signed)
Daniel Daniel [name] Phone: [phone] Subjective:   Daniel Daniel [name], am serving as a scribe for [name]. This visit occurred during the SARS-CoV-2 public health emergency.  Safety protocols were in place, including screening questions prior to the visit, additional usage of staff PPE, and extensive cleaning of exam room while observing appropriate contact time as indicated for disinfecting solutions.    I'm seeing this patient by the request  of:  Patient, Daniel [name]  CC: Low back pain follow-up  [id number]  Daniel Daniel [name] is a 70 y.o. male coming in with complaint of back and neck pain. OMT 01/21/2021.Patient states that he is doing much better after the epidural.  Patient states that he is not having significant improvement in range of motion.  Was able to unload a truck without any significant difficulty with his lower back furniture.  Patient is feeling better overall as well.  Medications patient has been prescribed: Hydroxizine, Elavil   Taking: Yes         Reviewed prior external information including notes and imaging from previsou exam, outside providers and external EMR if available.   As well as notes that were available from care everywhere and other healthcare systems.  Past medical history, social, surgical and family history all reviewed in electronic medical record.  Daniel [name] unless stated regarding to the chief complaint.   Past Medical History:  Diagnosis Date   Acne    Anxiety    DDD (degenerative disc disease), lumbosacral    chronic back pain   Diverticulosis of colon (without mention of hemorrhage)    colo 09/2010   Low back pain radiating to both legs     Allergies  Allergen Reactions   Durezol [Difluprednate] Swelling    These eye drops cause swelling     Review of Systems:  Daniel [name] headache, visual changes, nausea, vomiting, diarrhea, constipation,  dizziness, abdominal pain, skin rash, fevers, chills, night sweats, weight loss, swollen lymph nodes, body aches, joint swelling, chest pain, shortness of breath, mood changes. POSITIVE muscle aches  Objective  Blood pressure 118/74, pulse 67, height 5\' 9"  (1.753 m), SpO2 98 %.   General: Daniel [name] alert and oriented x3 mood and affect normal, dressed appropriately.  HEENT: Pupils equal, extraocular movements intact  Respiratory: Patient's speak in full sentences and does not appear short of breath  Cardiovascular: Daniel [name] lower extremity edema, non tender, Daniel [name] erythema  Low back exam does have some mild loss of lordosis.  Patient does have tightness noted in the [place] area as well.  Negative straight leg test.  Osteopathic findings  C2 flexed rotated and side bent right C7 flexed rotated and side bent left T3 extended rotated and side bent right inhaled rib T9 extended rotated and side bent left L2 flexed rotated and side bent right Sacrum right on right       Assessment and Plan:  DDD (degenerative disc disease), lumbosacral Patient is responded extremely well to the epidural.  Patient is still doing better overall.  Discussed icing regimen and home exercises, discussed which activities to doing which wants to avoid.  Increase activity slowly.  Follow-up with me again in 6 to 8 weeks.   Nonallopathic problems  Decision today to treat with OMT was based on Physical Exam  After verbal consent patient was treated with HVLA, ME, FPR techniques in cervical, rib, thoracic, lumbar, and sacral  areas  Patient tolerated  the procedure well with improvement in symptoms  Patient given exercises, stretches and lifestyle modifications  See medications in patient instructions if given  Patient will follow up in 4-8 weeks      The above documentation has been reviewed and is accurate and complete Daniel Pulley, DO        Note: This dictation was prepared with Dragon  dictation along with smaller phrase technology. Any transcriptional errors that result from this process are unintentional.

## 2021-02-23 ENCOUNTER — Ambulatory Visit (INDEPENDENT_AMBULATORY_CARE_PROVIDER_SITE_OTHER): Payer: Medicare Other | Admitting: Family Medicine

## 2021-02-23 ENCOUNTER — Other Ambulatory Visit: Payer: Self-pay

## 2021-02-23 VITALS — BP 118/74 | HR 67 | Ht 69.0 in

## 2021-02-23 DIAGNOSIS — M9908 Segmental and somatic dysfunction of rib cage: Secondary | ICD-10-CM | POA: Diagnosis not present

## 2021-02-23 DIAGNOSIS — M9903 Segmental and somatic dysfunction of lumbar region: Secondary | ICD-10-CM

## 2021-02-23 DIAGNOSIS — M9904 Segmental and somatic dysfunction of sacral region: Secondary | ICD-10-CM | POA: Diagnosis not present

## 2021-02-23 DIAGNOSIS — M5137 Other intervertebral disc degeneration, lumbosacral region: Secondary | ICD-10-CM | POA: Diagnosis not present

## 2021-02-23 DIAGNOSIS — M9902 Segmental and somatic dysfunction of thoracic region: Secondary | ICD-10-CM | POA: Diagnosis not present

## 2021-02-23 DIAGNOSIS — M9901 Segmental and somatic dysfunction of cervical region: Secondary | ICD-10-CM

## 2021-02-23 NOTE — Assessment & Plan Note (Signed)
Patient is responded extremely well to the epidural.  Patient is still doing better overall.  Discussed icing regimen and home exercises, discussed which activities to doing which wants to avoid.  Increase activity slowly.  Follow-up with me again in 6 to 8 weeks.

## 2021-02-23 NOTE — Patient Instructions (Signed)
Glad you are doing better Get flu shot and COVID vaccine soon [employer] See me again in 6 weeks

## 2021-03-01 DIAGNOSIS — Z23 Encounter for immunization: Secondary | ICD-10-CM | POA: Diagnosis not present

## 2021-03-05 DIAGNOSIS — Z23 Encounter for immunization: Secondary | ICD-10-CM | POA: Diagnosis not present

## 2021-04-06 ENCOUNTER — Ambulatory Visit: Payer: Medicare Other | Admitting: Family Medicine

## 2021-04-19 DIAGNOSIS — D1801 Hemangioma of skin and subcutaneous tissue: Secondary | ICD-10-CM | POA: Diagnosis not present

## 2021-04-19 DIAGNOSIS — L718 Other rosacea: Secondary | ICD-10-CM | POA: Diagnosis not present

## 2021-04-19 DIAGNOSIS — L821 Other seborrheic keratosis: Secondary | ICD-10-CM | POA: Diagnosis not present

## 2021-05-05 NOTE — Progress Notes (Signed)
[place] Phone: [phone] Subjective:   Daniel Daniel [name], am serving as a scribe for [name].  This visit occurred during the SARS-CoV-2 public health emergency.  Safety protocols were in place, including screening questions prior to the visit, additional usage of staff PPE, and extensive cleaning of exam room while observing appropriate contact time as indicated for disinfecting solutions.    I'm seeing this patient by the request  of:  Patient, Daniel [name]  CC: Neck and back pain follow-up  [id number]  Daniel Daniel [name] is a 70 y.o. male coming in with complaint of back and neck pain. OMT 104/2022. Patient states that he is doing fine today. Icing lumbar spine in the mornings. Denies any radiating symptoms.   Medications patient has been prescribed: Elavil, Hydroxizine          Reviewed prior external information including notes and imaging from previsou exam, outside providers and external EMR if available.   As well as notes that were available from care everywhere and other healthcare systems.  Past medical history, social, surgical and family history all reviewed in electronic medical record.  Daniel [name] unless stated regarding to the chief complaint.   Past Medical History:  Diagnosis Date   Acne    Anxiety    DDD (degenerative disc disease), lumbosacral    chronic back pain   Diverticulosis of colon (without mention of hemorrhage)    colo 09/2010   Low back pain radiating to both legs     Allergies  Allergen Reactions   Durezol [Difluprednate] Swelling    These eye drops cause swelling     Review of Systems:  Daniel [name] headache, visual changes, nausea, vomiting, diarrhea, constipation, dizziness, abdominal pain, skin rash, fevers, chills, night sweats, weight loss, swollen lymph nodes, body aches, joint swelling, chest pain, shortness of breath, mood changes.  POSITIVE muscle aches  Objective  Blood pressure 120/82, pulse 70, height 5\' 9"  (1.753 m), weight 218 lb (98.9 kg), SpO2 95 %.   General: Daniel [name] alert and oriented x3 mood and affect normal, dressed appropriately.  HEENT: Pupils equal, extraocular movements intact  Respiratory: Patient's speak in full sentences and does not appear short of breath  Cardiovascular: Daniel [name] lower extremity edema, non tender, Daniel [name] erythema  Neck exam does show some mild loss of lordosis.  Significant or increasing tightness of the lumbar spine.  Patient does have significant tightness with straight leg test.  Lacks the last 10 degrees of extension of the back in the last 5 degrees of flexion of the back.  Difficulty to do [facility] secondary to tightness.  Osteopathic findings  C2 flexed rotated and side bent right C6 flexed rotated and side bent left T3 extended rotated and side bent right inhaled rib T9 extended rotated and side bent left L2 flexed rotated and side bent right Sacrum right on right       Assessment and Plan:  DDD (degenerative disc disease), lumbosacral Increasing tightness noted.  Discussed icing regimen and home exercises, discussed which activities to do which wants to avoid.  Increase activity slowly.  Follow-up with me again in 6 to 8 weeks.  Discussed some medications including the amitriptyline and the hydroxyzine.   Nonallopathic problems  Decision today to treat with OMT was based on Physical Exam  After verbal consent patient was treated with HVLA, ME, FPR techniques in cervical, rib, thoracic, lumbar, and sacral  areas  Patient tolerated the procedure well with improvement in symptoms  Patient given exercises, stretches and lifestyle modifications  See medications in patient instructions if given  Patient will follow up in 4-8 weeks     The above documentation has been reviewed and is accurate and complete Lyndal Pulley, DO        Note: This dictation  was prepared with Dragon dictation along with smaller phrase technology. Any transcriptional errors that result from this process are unintentional.

## 2021-05-06 ENCOUNTER — Ambulatory Visit (INDEPENDENT_AMBULATORY_CARE_PROVIDER_SITE_OTHER): Payer: Medicare Other | Admitting: Family Medicine

## 2021-05-06 ENCOUNTER — Other Ambulatory Visit: Payer: Self-pay

## 2021-05-06 ENCOUNTER — Encounter: Payer: Self-pay | Admitting: Family Medicine

## 2021-05-06 VITALS — BP 120/82 | HR 70 | Ht 69.0 in | Wt 218.0 lb

## 2021-05-06 DIAGNOSIS — M9908 Segmental and somatic dysfunction of rib cage: Secondary | ICD-10-CM

## 2021-05-06 DIAGNOSIS — M9902 Segmental and somatic dysfunction of thoracic region: Secondary | ICD-10-CM | POA: Diagnosis not present

## 2021-05-06 DIAGNOSIS — M9904 Segmental and somatic dysfunction of sacral region: Secondary | ICD-10-CM

## 2021-05-06 DIAGNOSIS — M5137 Other intervertebral disc degeneration, lumbosacral region: Secondary | ICD-10-CM

## 2021-05-06 DIAGNOSIS — M9903 Segmental and somatic dysfunction of lumbar region: Secondary | ICD-10-CM | POA: Diagnosis not present

## 2021-05-06 DIAGNOSIS — M9901 Segmental and somatic dysfunction of cervical region: Secondary | ICD-10-CM

## 2021-05-06 NOTE — Patient Instructions (Signed)
Thanks for sweets Back is tight Careful with furniture See me in 6-8 weeks

## 2021-05-06 NOTE — Assessment & Plan Note (Addendum)
Increasing tightness noted.  Discussed icing regimen and home exercises, discussed which activities to do which wants to avoid.  Increase activity slowly.  Follow-up with me again in 6 to 8 weeks.  Discussed some medications including the amitriptyline and the hydroxyzine.

## 2021-06-10 NOTE — Progress Notes (Signed)
[name] Phone: [phone] Subjective:   [name], am serving as a scribe for [name]. This visit occurred during the SARS-CoV-2 public health emergency.  Safety protocols were in place, including screening questions prior to the visit, additional usage of staff PPE, and extensive cleaning of exam room while observing appropriate contact time as indicated for disinfecting solutions.   I'm seeing this patient by the request  of:  Patient, No Pcp Per (Inactive)  CC: neck and back pain f/u   [id number]  Daniel [name] is a 71 y.o. male coming in with complaint of back and neck pain. OMT 05/06/2021. Patient states that he has good and bad days. Moving towards the time when he will need another epidural.  Patient states that he has noticed some tightness recently.  Has been uncomfortable at night as well.  Medications patient has been prescribed: Elavil  Taking:         Reviewed prior external information including notes and imaging from previsou exam, outside providers and external EMR if available.   As well as notes that were available from care everywhere and other healthcare systems.  Past medical history, social, surgical and family history all reviewed in electronic medical record.  No pertanent information unless stated regarding to the chief complaint.   Past Medical History:  Diagnosis Date   Acne    Anxiety    DDD (degenerative disc disease), lumbosacral    chronic back pain   Diverticulosis of colon (without mention of hemorrhage)    colo 09/2010   Low back pain radiating to both legs     Allergies  Allergen Reactions   Durezol [Difluprednate] Swelling    These eye drops cause swelling     Review of Systems:  No headache, visual changes, nausea, vomiting, diarrhea, constipation, dizziness, abdominal pain, skin rash, fevers, chills, night sweats, weight loss, swollen  lymph nodes, body aches, joint swelling, chest pain, shortness of breath, mood changes. POSITIVE muscle aches  Objective  Blood pressure 124/72, pulse 75, height 5\' 9"  (1.753 m), weight 218 lb (98.9 kg), SpO2 98 %.   General: No apparent distress alert and oriented x3 mood and affect normal, dressed appropriately.  HEENT: Pupils equal, extraocular movements intact  Respiratory: Patient's speak in full sentences and does not appear short of breath  Cardiovascular: No lower extremity edema, non tender, no erythema  Low back exam does test bilaterally.  Patient does have tightness with straight leg test bilaterally.  Patient has limited rotation and sidebending of the lower back more than usual.  Osteopathic findings  C2 flexed rotated and side bent right C6 flexed rotated and side bent left T3 extended rotated and side bent right inhaled rib T9 extended rotated and side bent left L2 flexed rotated and side bent right L4 flexed rotated and side bent left Sacrum right on right       Assessment and Plan:  DDD (degenerative disc disease), lumbosacral Chronic, with exacerbation.  Discussed with patient about icing regimen and home exercises.  He does have the amitriptyline.  Worsening pain can consider the possibility of another epidural injection which patient has responded to previously.  Patient does feel that the pain can be helpful as well as needed.  Follow-up with me again in 4-5 weeks    Nonallopathic problems  Decision today to treat with OMT was based on Physical Exam  After verbal consent patient was treated with  HVLA, ME, FPR techniques in cervical, rib, thoracic, lumbar, and sacral  areas  Patient tolerated the procedure well with improvement in symptoms  Patient given exercises, stretches and lifestyle modifications  See medications in patient instructions if given  Patient will follow up in 4-8 weeks      The above documentation has been reviewed and is  accurate and complete Lyndal Pulley, DO        Note: This dictation was prepared with Dragon dictation along with smaller phrase technology. Any transcriptional errors that result from this process are unintentional.

## 2021-06-15 ENCOUNTER — Other Ambulatory Visit: Payer: Self-pay

## 2021-06-15 ENCOUNTER — Encounter: Payer: Self-pay | Admitting: Family Medicine

## 2021-06-15 ENCOUNTER — Ambulatory Visit (INDEPENDENT_AMBULATORY_CARE_PROVIDER_SITE_OTHER): Payer: Medicare Other | Admitting: Family Medicine

## 2021-06-15 VITALS — BP 124/72 | HR 75 | Ht 69.0 in | Wt 218.0 lb

## 2021-06-15 DIAGNOSIS — M9901 Segmental and somatic dysfunction of cervical region: Secondary | ICD-10-CM

## 2021-06-15 DIAGNOSIS — M9902 Segmental and somatic dysfunction of thoracic region: Secondary | ICD-10-CM | POA: Diagnosis not present

## 2021-06-15 DIAGNOSIS — M9904 Segmental and somatic dysfunction of sacral region: Secondary | ICD-10-CM

## 2021-06-15 DIAGNOSIS — M5137 Other intervertebral disc degeneration, lumbosacral region: Secondary | ICD-10-CM

## 2021-06-15 DIAGNOSIS — M9908 Segmental and somatic dysfunction of rib cage: Secondary | ICD-10-CM

## 2021-06-15 DIAGNOSIS — M51379 Other intervertebral disc degeneration, lumbosacral region without mention of lumbar back pain or lower extremity pain: Secondary | ICD-10-CM

## 2021-06-15 DIAGNOSIS — M9903 Segmental and somatic dysfunction of lumbar region: Secondary | ICD-10-CM | POA: Diagnosis not present

## 2021-06-15 NOTE — Patient Instructions (Signed)
Good to see you Sorry about [employer] Keep stretching See me again in 4-5 weeks

## 2021-06-15 NOTE — Assessment & Plan Note (Signed)
Chronic, with exacerbation.  Discussed with patient about icing regimen and home exercises.  He does have the amitriptyline.  Worsening pain can consider the possibility of another epidural injection which patient has responded to previously.  Patient does feel that the pain can be helpful as well as needed.  Follow-up with me again in 4-5 weeks

## 2021-07-15 ENCOUNTER — Other Ambulatory Visit: Payer: Self-pay | Admitting: Family Medicine

## 2021-07-16 NOTE — Progress Notes (Signed)
Daniel [name] [address] [place] Phone: [phone] Subjective:   IVilma [name], am serving as a scribe for [name]. This visit occurred during the SARS-CoV-2 public health emergency.  Safety protocols were in place, including screening questions prior to the visit, additional usage of staff PPE, and extensive cleaning of exam room while observing appropriate contact time as indicated for disinfecting solutions.   I'm seeing this patient by the request  of:  Patient, No Pcp Per (Inactive)  CC: Back and neck pain follow-up  [id number]  Daniel [name] is a 71 y.o. male coming in with complaint of back and neck pain. OMT 06/15/2021. Patient states same per usual. Good and bad days. No new complaints.  Medications patient has been prescribed: Elavil  Taking:         Reviewed prior external information including notes and imaging from previsou exam, outside providers and external EMR if available.   As well as notes that were available from care everywhere and other healthcare systems.  Past medical history, social, surgical and family history all reviewed in electronic medical record.  No pertanent information unless stated regarding to the chief complaint.   Past Medical History:  Diagnosis Date   Acne    Anxiety    DDD (degenerative disc disease), lumbosacral    chronic back pain   Diverticulosis of colon (without mention of hemorrhage)    colo 09/2010   Low back pain radiating to both legs     Allergies  Allergen Reactions   Durezol [Difluprednate] Swelling    These eye drops cause swelling     Review of Systems:  No headache, visual changes, nausea, vomiting, diarrhea, constipation, dizziness, abdominal pain, skin rash, fevers, chills, night sweats, weight loss, swollen lymph nodes, body aches, joint swelling, chest pain, shortness of breath, mood changes. POSITIVE muscle aches  Objective  Blood pressure  122/84, pulse 71, height 5\' 9"  (1.753 m), weight 224 lb (101.6 kg), SpO2 96 %.   General: No apparent distress alert and oriented x3 mood and affect normal, dressed appropriately.  HEENT: Pupils equal, extraocular movements intact  Respiratory: Patient's speak in full sentences and does not appear short of breath  Cardiovascular: No lower extremity edema, non tender, no erythema  Low back exam does have some loss of lordosis.  Some tenderness to palpation in the paraspinal musculature.  Still has significant tightness even with FABER test.  Patient has tightness with straight leg test but no true radicular symptoms noted.  Neurovascular intact distally. Tightness noted in the neck as well.  Osteopathic findings  C5 flexed rotated and side bent left T5 extended rotated and side bent right inhaled rib L2 flexed rotated and side bent right L5 flexed rotated and side bent left Sacrum right on right       Assessment and Plan:  DDD (degenerative disc disease), lumbosacral Known degenerative disc disease and continues to have some discomfort.  Patient does have amitriptyline if necessary.  Patient also has hydroxyzine which she has been using as a muscle relaxer as well sometimes.  Discussed icing regimen and home exercises, which activities to do and which ones to avoid.  Increase activity slowly.  No significant massive changes in management.  Follow-up again in 4 to 6 weeks    Nonallopathic problems  Decision today to treat with OMT was based on Physical Exam  After verbal consent patient was treated with HVLA, ME, FPR techniques in cervical, rib,  thoracic, lumbar, and sacral  areas  Patient tolerated the procedure well with improvement in symptoms  Patient given exercises, stretches and lifestyle modifications  See medications in patient instructions if given  Patient will follow up in 4-8 weeks      The above documentation has been reviewed and is accurate and complete  Lyndal Pulley, DO        Note: This dictation was prepared with Dragon dictation along with smaller phrase technology. Any transcriptional errors that result from this process are unintentional.

## 2021-07-19 ENCOUNTER — Encounter: Payer: Self-pay | Admitting: Family Medicine

## 2021-07-19 ENCOUNTER — Ambulatory Visit (INDEPENDENT_AMBULATORY_CARE_PROVIDER_SITE_OTHER): Payer: Medicare Other | Admitting: Family Medicine

## 2021-07-19 ENCOUNTER — Other Ambulatory Visit: Payer: Self-pay

## 2021-07-19 VITALS — BP 122/84 | HR 71 | Ht 69.0 in | Wt 224.0 lb

## 2021-07-19 DIAGNOSIS — M9902 Segmental and somatic dysfunction of thoracic region: Secondary | ICD-10-CM

## 2021-07-19 DIAGNOSIS — M9901 Segmental and somatic dysfunction of cervical region: Secondary | ICD-10-CM | POA: Diagnosis not present

## 2021-07-19 DIAGNOSIS — M5137 Other intervertebral disc degeneration, lumbosacral region: Secondary | ICD-10-CM | POA: Diagnosis not present

## 2021-07-19 DIAGNOSIS — M9908 Segmental and somatic dysfunction of rib cage: Secondary | ICD-10-CM | POA: Diagnosis not present

## 2021-07-19 DIAGNOSIS — M51379 Other intervertebral disc degeneration, lumbosacral region without mention of lumbar back pain or lower extremity pain: Secondary | ICD-10-CM

## 2021-07-19 DIAGNOSIS — M9904 Segmental and somatic dysfunction of sacral region: Secondary | ICD-10-CM

## 2021-07-19 DIAGNOSIS — M9903 Segmental and somatic dysfunction of lumbar region: Secondary | ICD-10-CM

## 2021-07-19 NOTE — Patient Instructions (Signed)
Good to see you! Thanks for giving me 30 See you again in 4 weeks

## 2021-07-19 NOTE — Assessment & Plan Note (Signed)
Known degenerative disc disease and continues to have some discomfort.  Patient does have amitriptyline if necessary.  Patient also has hydroxyzine which she has been using as a muscle relaxer as well sometimes.  Discussed icing regimen and home exercises, which activities to do and which ones to avoid.  Increase activity slowly.  No significant massive changes in management.  Follow-up again in 4 to 6 weeks

## 2021-08-13 NOTE — Progress Notes (Signed)
?Charlann Boxer D.O. ?[facility] ?[place] ?Phone: [phone] ?Subjective:   ?I, Jacqualin Combes, am serving as a scribe for [name]. ? ?This visit occurred during the SARS-CoV-2 public health emergency.  Safety protocols were in place, including screening questions prior to the visit, additional usage of staff PPE, and extensive cleaning of exam room while observing appropriate contact time as indicated for disinfecting solutions.  ? ? ?I'm seeing this patient by the request  of:  Patient, No Pcp Per (Inactive) ? ?CC: Back and neck pain follow-up left shoulder pain ? ?[id number]  ?Daniel [name] is a 71 y.o. male coming in with complaint of back and neck pain. OMT 07/19/2021. Patient states that he started walking. Patient is having a lot of pain in L shoulder and upper arm. Painful with flexion.  ? ?Back has been doing better since starting to work on his core.  ? ?Medications patient has been prescribed: Hydroxizine, Elavil ? ?Taking: ? ? ?  ? ? ? ? ?Reviewed prior external information including notes and imaging from previsou exam, outside providers and external EMR if available.  ? ?As well as notes that were available from care everywhere and other healthcare systems. ? ?Past medical history, social, surgical and family history all reviewed in electronic medical record.  No pertanent information unless stated regarding to the chief complaint.  ? ?Past Medical History:  ?Diagnosis Date  ? Acne   ? Anxiety   ? DDD (degenerative disc disease), lumbosacral   ? chronic back pain  ? Diverticulosis of colon (without mention of hemorrhage)   ? colo 09/2010  ? Low back pain radiating to both legs   ?  ?Allergies  ?Allergen Reactions  ? Durezol [Difluprednate] Swelling  ?  These eye drops cause swelling  ? ? ? ?Review of Systems: ? No headache, visual changes, nausea, vomiting, diarrhea, constipation, dizziness, abdominal pain, skin rash, fevers, chills, night sweats,  weight loss, swollen lymph nodes, body aches, joint swelling, chest pain, shortness of breath, mood changes. POSITIVE muscle aches ? ?Objective  ?Blood pressure 118/82, pulse 67, height '5\' 9"'$  (1.753 m), weight 219 lb (99.3 kg), SpO2 97 %. ?  ?General: No apparent distress alert and oriented x3 mood and affect normal, dressed appropriately.  ?HEENT: Pupils equal, extraocular movements intact  ?Respiratory: Patient's speak in full sentences and does not appear short of breath  ?Cardiovascular: No lower extremity edema, non tender, no erythema  ?Left shoulder exam does show the patient does have positive impingement noted.  Worsening pain on the posterior shoulder with external rotation.  Positive crossover sign noted.  Patient does have some tenderness over the acromioclavicular joint. ? ?Osteopathic findings ? ?C6 flexed rotated and side bent left ?T3 extended rotated and side bent left inhaled rib ?T9 extended rotated and side bent left ?L2 flexed rotated and side bent right ?Sacrum right on right ? ? ?  ?Assessment and Plan: ? ?Shoulder bursitis ?Patient has had shoulder bursitis previously.  Concern for potentially this as well as acromioclavicular arthritis.  We have discussed with patient in great length in detail though that patient will continue with some home exercises.  Worsening symptoms I would like to do an ultrasound and will likely injection.  X-rays pending today.  Follow-up again in 6 weeks  ? ?Nonallopathic problems ? ?Decision today to treat with OMT was based on Physical Exam ? ?After verbal consent patient was treated with HVLA, ME, FPR techniques in cervical,  rib, thoracic, lumbar, and sacral  areas ? ?Patient tolerated the procedure well with improvement in symptoms ? ?Patient given exercises, stretches and lifestyle modifications ? ?See medications in patient instructions if given ? ?Patient will follow up in 4-8 weeks ? ?  ? ?The above documentation has been reviewed and is accurate and  complete Daniel Pulley, DO ? ? ? ?  ? ? Note: This dictation was prepared with Dragon dictation along with smaller phrase technology. Any transcriptional errors that result from this process are unintentional.    ?  ?  ? ?

## 2021-08-16 ENCOUNTER — Ambulatory Visit (INDEPENDENT_AMBULATORY_CARE_PROVIDER_SITE_OTHER): Payer: Medicare Other

## 2021-08-16 ENCOUNTER — Ambulatory Visit (INDEPENDENT_AMBULATORY_CARE_PROVIDER_SITE_OTHER): Payer: Medicare Other | Admitting: Family Medicine

## 2021-08-16 ENCOUNTER — Ambulatory Visit: Payer: Self-pay

## 2021-08-16 ENCOUNTER — Other Ambulatory Visit: Payer: Self-pay

## 2021-08-16 VITALS — BP 118/82 | HR 67 | Ht 69.0 in | Wt 219.0 lb

## 2021-08-16 DIAGNOSIS — M9904 Segmental and somatic dysfunction of sacral region: Secondary | ICD-10-CM | POA: Diagnosis not present

## 2021-08-16 DIAGNOSIS — M9901 Segmental and somatic dysfunction of cervical region: Secondary | ICD-10-CM

## 2021-08-16 DIAGNOSIS — M9908 Segmental and somatic dysfunction of rib cage: Secondary | ICD-10-CM | POA: Diagnosis not present

## 2021-08-16 DIAGNOSIS — M9903 Segmental and somatic dysfunction of lumbar region: Secondary | ICD-10-CM | POA: Diagnosis not present

## 2021-08-16 DIAGNOSIS — M7552 Bursitis of left shoulder: Secondary | ICD-10-CM | POA: Diagnosis not present

## 2021-08-16 DIAGNOSIS — M5137 Other intervertebral disc degeneration, lumbosacral region: Secondary | ICD-10-CM

## 2021-08-16 DIAGNOSIS — M9902 Segmental and somatic dysfunction of thoracic region: Secondary | ICD-10-CM | POA: Diagnosis not present

## 2021-08-16 DIAGNOSIS — M25512 Pain in left shoulder: Secondary | ICD-10-CM | POA: Diagnosis not present

## 2021-08-16 NOTE — Patient Instructions (Addendum)
Xray on the way out ?Dr. Altha Harm [facility] ?Keep up with working out ?Keep hands in peripheral vision ?See me in 6-7 weeks ?

## 2021-08-16 NOTE — Assessment & Plan Note (Signed)
Patient has had shoulder bursitis previously.  Concern for potentially this as well as acromioclavicular arthritis.  We have discussed with patient in great length in detail though that patient will continue with some home exercises.  Worsening symptoms I would like to do an ultrasound and will likely injection.  X-rays pending today.  Follow-up again in 6 weeks ?

## 2021-08-16 NOTE — Assessment & Plan Note (Signed)
Chronic problem.  We discussed icing regimen and home exercises, continue to work on posture, ergonomics and weight loss.  Follow-up again in 6 to 12 weeks. ?

## 2021-09-24 NOTE — Progress Notes (Deleted)
  [place] Phone: [phone] Subjective:    I'm seeing this patient by the request  of:  Patient, No Pcp Per (Inactive)  CC: Back and neck pain follow-up  [id number]  Rasheen Bells is a 71 y.o. male coming in with complaint of back and neck pain. OMT on 08/16/2021. Also seen for left shoulder pain. Patient has had shoulder bursitis previously.  Concern for potentially this as well as acromioclavicular arthritis.  We have discussed with patient in great length in detail though that patient will continue with some home exercises.  Worsening symptoms I would like to do an ultrasound and will likely injection.  X-rays pending today.  Follow-up again in 6 weeks. Patient states   Medications patient has been prescribed: Elavil  Taking:         Reviewed prior external information including notes and imaging from previsou exam, outside providers and external EMR if available.   As well as notes that were available from care everywhere and other healthcare systems.  Past medical history, social, surgical and family history all reviewed in electronic medical record.  No pertanent information unless stated regarding to the chief complaint.   Past Medical History:  Diagnosis Date   Acne    Anxiety    DDD (degenerative disc disease), lumbosacral    chronic back pain   Diverticulosis of colon (without mention of hemorrhage)    colo 09/2010   Low back pain radiating to both legs     Allergies  Allergen Reactions   Durezol [Difluprednate] Swelling    These eye drops cause swelling     Review of Systems:  No headache, visual changes, nausea, vomiting, diarrhea, constipation, dizziness, abdominal pain, skin rash, fevers, chills, night sweats, weight loss, swollen lymph nodes, body aches, joint swelling, chest pain, shortness of breath, mood changes. POSITIVE muscle aches  Objective  There were no vitals taken  for this visit.   General: No apparent distress alert and oriented x3 mood and affect normal, dressed appropriately.  HEENT: Pupils equal, extraocular movements intact  Respiratory: Patient's speak in full sentences and does not appear short of breath  Cardiovascular: No lower extremity edema, non tender, no erythema    Osteopathic findings  C2 flexed rotated and side bent right C7 flexed rotated and side bent left T3 extended rotated and side bent right inhaled rib L1 flexed rotated and side bent right Sacrum right on right       Assessment and Plan:  No problem-specific Assessment & Plan notes found for this encounter.    Nonallopathic problems  Decision today to treat with OMT was based on Physical Exam  After verbal consent patient was treated with HVLA, ME, FPR techniques in cervical, rib, thoracic, lumbar, and sacral  areas  Patient tolerated the procedure well with improvement in symptoms  Patient given exercises, stretches and lifestyle modifications  See medications in patient instructions if given  Patient will follow up in 4-8 weeks      The above documentation has been reviewed and is accurate and complete Lyndal Pulley, DO        Note: This dictation was prepared with Dragon dictation along with smaller phrase technology. Any transcriptional errors that result from this process are unintentional.

## 2021-09-28 ENCOUNTER — Ambulatory Visit: Payer: Medicare Other | Admitting: Family Medicine

## 2021-09-29 NOTE — Progress Notes (Signed)
?Charlann Boxer D.O. ?[facility] ?[place] ?Phone: [phone] ?Subjective:   ?I, Jacqualin Combes, am serving as a scribe for [name]. ? ?This visit occurred during the SARS-CoV-2 public health emergency.  Safety protocols were in place, including screening questions prior to the visit, additional usage of staff PPE, and extensive cleaning of exam room while observing appropriate contact time as indicated for disinfecting solutions.  ? ? ?I'm seeing this patient by the request  of:  Patient, No Pcp Per (Inactive) ? ?CC:  ? ?[id number]  ?08/16/2021 ?Patient has had shoulder bursitis previously.  Concern for potentially this as well as acromioclavicular arthritis.  We have discussed with patient in great length in detail though that patient will continue with some home exercises.  Worsening symptoms I would like to do an ultrasound and will likely injection.  X-rays pending today.  Follow-up again in 6 weeks ? ?Update 10/01/2021 ?Jered Heiny is a 71 y.o. male coming in with complaint of L shoulder and back pain. Patient states that his shoulder pain has not improved. Painful to raise arm overhead over middle deltoid. Intermittent numbness but not that often.  ? ?Back pain is unchanged since lat visit.  Still significant tightness no radiation of pain.  Has noted some mild increase in neck pain.  No radiation down the arms ? ? ? ?  ? ?Past Medical History:  ?Diagnosis Date  ? Acne   ? Anxiety   ? DDD (degenerative disc disease), lumbosacral   ? chronic back pain  ? Diverticulosis of colon (without mention of hemorrhage)   ? colo 09/2010  ? Low back pain radiating to both legs   ? ?Past Surgical History:  ?Procedure Laterality Date  ? CATARACT EXTRACTION Left 07/14/2015  ? CYST EXCISION Left 06/23/2015  ? Procedure: EXCISION MASS LEFT INDEX FINGER;  Surgeon: Daryll Brod, MD;  Location: [facility];  Service: Orthopedics;  Laterality: Left;  ANESTHESIA: IV  REGIONAL/FAB  ? HERNIA REPAIR    ? REFRACTIVE SURGERY    ? left eye/ with enhancement  ? TONSILLECTOMY    ? ?Social History  ? ?Socioeconomic History  ? Marital status: Single  ?  Spouse name: Not on file  ? Number of children: Not on file  ? Years of education: Not on file  ? Highest education level: Not on file  ?Occupational History  ? Not on file  ?Tobacco Use  ? Smoking status: Never  ? Smokeless tobacco: Never  ?Vaping Use  ? Vaping Use: Never used  ?Substance and Sexual Activity  ? Alcohol use: Yes  ?  Comment: weekly  ? Drug use: No  ? Sexual activity: Not on file  ?Other Topics Concern  ? Not on file  ?Social History Narrative  ? Caffienated beverages-Yes  ? Seat belts often-Yes  ? Smoke alarm in home-Yes  ? Firearms/guns in home-Yes  ? History of physical abuse-NO  ? HLE- Masters degree  ? Right handed  ? Two story home  ? Lives alone  ? Education- Masters  ? ?Social Determinants of Health  ? ?Financial Resource Strain: Not on file  ?Food Insecurity: Not on file  ?Transportation Needs: Not on file  ?Physical Activity: Not on file  ?Stress: Not on file  ?Social Connections: Not on file  ? ?Allergies  ?Allergen Reactions  ? Durezol [Difluprednate] Swelling  ?  These eye drops cause swelling  ? ?Family History  ?Problem Relation Age of Onset  ?  Diabetes Mother   ? Hypertension Mother   ? Arthritis Mother   ? Arthritis Father   ? Heart disease Father   ? Diabetes Sister   ? Cancer Neg Hx   ? Stroke Neg Hx   ? Kidney disease Neg Hx   ? Early death Neg Hx   ? ? ? ? ? ?Current Outpatient Medications (Analgesics):  ?  acetaminophen (TYLENOL) 500 MG tablet, Take 1,000 mg by mouth every 6 (six) hours as needed for mild pain. ? ? ?Current Outpatient Medications (Other):  ?  amitriptyline (ELAVIL) 25 MG tablet, TAKE 1 TABLET BY MOUTH EVERYDAY AT BEDTIME ?  doxycycline (VIBRAMYCIN) 50 MG capsule, Take 50 mg by mouth 2 (two) times daily. ?  hydrOXYzine (ATARAX/VISTARIL) 10 MG tablet, Take 1 tablet (10 mg total) by mouth  3 (three) times daily as needed. ?  Multiple Vitamins-Minerals (MULTIVITAMIN PO), Take 1 tablet by mouth at bedtime.  ? ? ?Reviewed prior external information including notes and imaging from  ?primary care provider ?As well as notes that were available from care everywhere and other healthcare systems. ? ?Past medical history, social, surgical and family history all reviewed in electronic medical record.  No pertanent information unless stated regarding to the chief complaint.  ? ?Review of Systems: ? No headache, visual changes, nausea, vomiting, diarrhea, constipation, dizziness, abdominal pain, skin rash, fevers, chills, night sweats, weight loss, swollen lymph nodes, body aches, joint swelling, chest pain, shortness of breath, mood changes. POSITIVE muscle aches ? ?Objective  ?Blood pressure 116/80, pulse (!) 102, height '5\' 9"'$  (1.753 m), weight 214 lb (97.1 kg), SpO2 97 %. ?  ?General: No apparent distress alert and oriented x3 mood and affect normal, dressed appropriately.  ?HEENT: Pupils equal, extraocular movements intact  ?Respiratory: Patient's speak in full sentences and does not appear short of breath  ?Cardiovascular: No lower extremity edema, non tender, no erythema  ?Gait normal with good balance and coordination.  ?MSK: Left shoulder exam does have positive impingement with Neer and Hawkins.  Positive crossover noted.  Rotator cuff strength 4+ out of 5.  Worsening pain with internal rotation. ? ?Neck exam does have some loss of lordosis.  Limited sidebending especially to the right and rotation to the left.  Negative Spurling's ?Low back significant tightness noted as well.  Difficulty with Corky Sox secondary to tightness today.  Lacks anything more than 10 degrees of extension ? ?Limited muscular skeletal ultrasound was performed and interpreted by Hulan Saas, M  ?Limited ultrasound of the left shoulder shows patient does have hypoechoic changes of the acromioclavicular joint with significant  narrowing consistent with arthritis and a joint effusion.  Patient does have hypoechoic changes around the supraspinatus consistent with a subacromial bursitis.  Patient does also have abnormality of the supraspinatus tendon that is consistent with potential intersubstance tearing but no significant retraction. 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 ?Procedure: Real-time Ultrasound Guided Injection of left glenohumeral joint ?Device: GE Logiq E  ?Ultrasound guided injection is preferred based studies that show increased duration, increased effect, greater accuracy, decreased procedural pain, increased response rate with ultrasound guided versus blind injection.  ?Verbal informed consent obtained.  ?Time-out conducted.  ?Noted no overlying erythema, induration, or other signs of local infection.  ?Skin prepped in a sterile fashion.  ?Local anesthesia: Topical Ethyl chloride.  ?With sterile technique and under real time ultrasound guidance:  Joint visualized.  21g 2 inch needle inserted posterior approach. Pictures  taken for needle placement. Patient did have injection of 2 cc of 0.5% Marcaine, and 1cc of Kenalog 40 mg/dL. ?Completed without difficulty  ?Pain immediately resolved suggesting accurate placement of the medication.  ?Advised to call if fevers/chills, erythema, induration, drainage, or persistent bleeding.  ?Impression: Technically successful ultrasound guided injection. ? ?Procedure: Real-time Ultrasound Guided Injection of left acromioclavicular joint ?Device: GE Logiq Q7 ?Ultrasound guided injection is preferred based studies that show increased duration, increased effect, greater accuracy, decreased procedural pain, increased response rate, and decreased cost with ultrasound guided versus blind injection.  ?Verbal informed consent obtained.  ?Time-out conducted.  ?Noted no overlying erythema, induration, or other signs of local  infection.  ?Skin prepped in a sterile fashion.  ?Local anesthesia: Topical Ethyl chloride.  ?With sterile technique and under real time ultrasound guidance: With a 25-gauge half inch needle injected with 0.5 cc of

## 2021-10-01 ENCOUNTER — Ambulatory Visit: Payer: Self-pay

## 2021-10-01 ENCOUNTER — Ambulatory Visit (INDEPENDENT_AMBULATORY_CARE_PROVIDER_SITE_OTHER): Payer: Medicare Other | Admitting: Family Medicine

## 2021-10-01 VITALS — BP 116/80 | HR 102 | Ht 69.0 in | Wt 214.0 lb

## 2021-10-01 DIAGNOSIS — M999 Biomechanical lesion, unspecified: Secondary | ICD-10-CM | POA: Diagnosis not present

## 2021-10-01 DIAGNOSIS — M7552 Bursitis of left shoulder: Secondary | ICD-10-CM

## 2021-10-01 DIAGNOSIS — M9901 Segmental and somatic dysfunction of cervical region: Secondary | ICD-10-CM | POA: Diagnosis not present

## 2021-10-01 DIAGNOSIS — M9904 Segmental and somatic dysfunction of sacral region: Secondary | ICD-10-CM | POA: Diagnosis not present

## 2021-10-01 DIAGNOSIS — M9908 Segmental and somatic dysfunction of rib cage: Secondary | ICD-10-CM | POA: Diagnosis not present

## 2021-10-01 DIAGNOSIS — M9902 Segmental and somatic dysfunction of thoracic region: Secondary | ICD-10-CM

## 2021-10-01 DIAGNOSIS — M9903 Segmental and somatic dysfunction of lumbar region: Secondary | ICD-10-CM

## 2021-10-01 DIAGNOSIS — M19012 Primary osteoarthritis, left shoulder: Secondary | ICD-10-CM | POA: Diagnosis not present

## 2021-10-01 DIAGNOSIS — M5030 Other cervical disc degeneration, unspecified cervical region: Secondary | ICD-10-CM | POA: Diagnosis not present

## 2021-10-01 DIAGNOSIS — M25512 Pain in left shoulder: Secondary | ICD-10-CM

## 2021-10-01 NOTE — Patient Instructions (Signed)
Two shoulder injections today ?Good to see you ?Hope this works ?Do exercises 3x a week starting next week ?See me in 6-7 weeks ?

## 2021-10-02 DIAGNOSIS — M25519 Pain in unspecified shoulder: Secondary | ICD-10-CM | POA: Insufficient documentation

## 2021-10-02 NOTE — Assessment & Plan Note (Signed)
On ultrasound today patient did have hypoechoic changes that was consistent with joint effusion.  Discussed icing regimen and home exercises, which activities to do and which ones to avoid, increase activity slowly.  Follow-up again in 6 to 8 weeks ?

## 2021-10-02 NOTE — Assessment & Plan Note (Signed)
Patient may no significant improvement with the acromioclavicular injection and given an injection in the shoulder today.  Patient had improvement in range of motion almost immediately.  Patient likely will do relatively well with conservative therapy otherwise and encouraged to do the home exercises.  Worsening symptoms would need to consider a possible MRI.  Follow-up with me again in 6 to 8 weeks ?

## 2021-10-02 NOTE — Assessment & Plan Note (Signed)

## 2021-10-02 NOTE — Assessment & Plan Note (Signed)
Mild tightness and seems to be an exacerbation of the underlying problem with patient's shoulder pain.  Discussed with patient about icing regimen and home exercises, increase activity slowly.  Follow-up again in 6 to 8 weeks ?

## 2021-10-05 DIAGNOSIS — H40013 Open angle with borderline findings, low risk, bilateral: Secondary | ICD-10-CM | POA: Diagnosis not present

## 2021-10-05 DIAGNOSIS — H2511 Age-related nuclear cataract, right eye: Secondary | ICD-10-CM | POA: Diagnosis not present

## 2021-10-05 DIAGNOSIS — Z961 Presence of intraocular lens: Secondary | ICD-10-CM | POA: Diagnosis not present

## 2021-10-07 DIAGNOSIS — R051 Acute cough: Secondary | ICD-10-CM | POA: Diagnosis not present

## 2021-11-02 DIAGNOSIS — Z136 Encounter for screening for cardiovascular disorders: Secondary | ICD-10-CM | POA: Diagnosis not present

## 2021-11-02 DIAGNOSIS — R030 Elevated blood-pressure reading, without diagnosis of hypertension: Secondary | ICD-10-CM | POA: Diagnosis not present

## 2021-11-02 DIAGNOSIS — R635 Abnormal weight gain: Secondary | ICD-10-CM | POA: Diagnosis not present

## 2021-11-02 DIAGNOSIS — R6882 Decreased libido: Secondary | ICD-10-CM | POA: Diagnosis not present

## 2021-11-02 DIAGNOSIS — Z1211 Encounter for screening for malignant neoplasm of colon: Secondary | ICD-10-CM | POA: Diagnosis not present

## 2021-11-02 DIAGNOSIS — Z0000 Encounter for general adult medical examination without abnormal findings: Secondary | ICD-10-CM | POA: Diagnosis not present

## 2021-11-02 DIAGNOSIS — R351 Nocturia: Secondary | ICD-10-CM | POA: Diagnosis not present

## 2021-11-05 NOTE — Progress Notes (Unsigned)
[facility] [address] [place] Phone: [phone] Subjective:   [name], am serving as a scribe for [name].  I'm seeing this patient by the request  of:  Bernerd Limbo, MD  CC: Neck and back pain follow-up  [id number]  Daniel [name] is a 71 y.o. male coming in with complaint of back and neck pain. OMT 10/01/2021. Also f/u for L shoulder pain. Patient states still having shoulder problems. Tightness in traps. Back doing better. No other complaints.   Medications patient has been prescribed: Elavil  Taking:         Reviewed prior external information including notes and imaging from previsou exam, outside providers and external EMR if available.   As well as notes that were available from care everywhere and other healthcare systems.  Past medical history, social, surgical and family history all reviewed in electronic medical record.  No pertanent information unless stated regarding to the chief complaint.   Past Medical History:  Diagnosis Date   Acne    Anxiety    DDD (degenerative disc disease), lumbosacral    chronic back pain   Diverticulosis of colon (without mention of hemorrhage)    colo 09/2010   Low back pain radiating to both legs     Allergies  Allergen Reactions   Durezol [Difluprednate] Swelling    These eye drops cause swelling     Review of Systems:  No headache, visual changes, nausea, vomiting, diarrhea, constipation, dizziness, abdominal pain, skin rash, fevers, chills, night sweats, weight loss, swollen lymph nodes, body aches, joint swelling, chest pain, shortness of breath, mood changes. POSITIVE muscle aches  Objective  Blood pressure 128/88, height '5\' 9"'$  (1.753 m), weight 218 lb (98.9 kg).   General: No apparent distress alert and oriented x3 mood and affect normal, dressed appropriately.  HEENT: Pupils equal, extraocular movements intact  Respiratory: Patient's speak in  full sentences and does not appear short of breath  Cardiovascular: No lower extremity edema, non tender, no erythema  Gait MSK:  Back back exam does have some loss of lordosis.  Still has tightness noted.  Some increasing tightness of the neck in the parascapular region.  Patient does have limited sidebending.  Only 5 degrees of extension of the neck noted.  Negative Spurling's but crepitus noted.  Osteopathic findings  C2 flexed rotated and side bent right C4 flexed rotated and side bent left C6 flexed rotated and side bent left T3 extended rotated and side bent right inhaled rib T9 extended rotated and side bent left L2 flexed rotated and side bent right Sacrum right on right       Assessment and Plan:  Degenerative disc disease, cervical Concerned that patient stress is likely contributing to more of this as well.  We will get a repeat x-ray to further evaluate to see if there is any progression of the arthritis since the MRI.  The patient is not having any radicular symptoms and likely more secondary to the tenderness and stress that the patient is having.  Discussed which activities to do and which ones to avoid otherwise.  Follow-up with me again in 6 to 8 weeks.    Nonallopathic problems  Decision today to treat with OMT was based on Physical Exam  After verbal consent patient was treated with HVLA, ME, FPR techniques in cervical, rib, thoracic, lumbar, and sacral  areas  Patient tolerated the procedure well with improvement in symptoms  Patient  given exercises, stretches and lifestyle modifications  See medications in patient instructions if given  Patient will follow up in 4-8 weeks    The above documentation has been reviewed and is accurate and complete Daniel Pulley, DO          Note: This dictation was prepared with Dragon dictation along with smaller phrase technology. Any transcriptional errors that result from this process are unintentional.

## 2021-11-09 ENCOUNTER — Ambulatory Visit (INDEPENDENT_AMBULATORY_CARE_PROVIDER_SITE_OTHER): Payer: Medicare Other | Admitting: Family Medicine

## 2021-11-09 ENCOUNTER — Ambulatory Visit (INDEPENDENT_AMBULATORY_CARE_PROVIDER_SITE_OTHER): Payer: Medicare Other

## 2021-11-09 VITALS — BP 128/88 | Ht 69.0 in | Wt 218.0 lb

## 2021-11-09 DIAGNOSIS — M9903 Segmental and somatic dysfunction of lumbar region: Secondary | ICD-10-CM | POA: Diagnosis not present

## 2021-11-09 DIAGNOSIS — M9901 Segmental and somatic dysfunction of cervical region: Secondary | ICD-10-CM | POA: Diagnosis not present

## 2021-11-09 DIAGNOSIS — M5030 Other cervical disc degeneration, unspecified cervical region: Secondary | ICD-10-CM | POA: Diagnosis not present

## 2021-11-09 DIAGNOSIS — M9908 Segmental and somatic dysfunction of rib cage: Secondary | ICD-10-CM

## 2021-11-09 DIAGNOSIS — M9904 Segmental and somatic dysfunction of sacral region: Secondary | ICD-10-CM | POA: Diagnosis not present

## 2021-11-09 DIAGNOSIS — M542 Cervicalgia: Secondary | ICD-10-CM | POA: Diagnosis not present

## 2021-11-09 DIAGNOSIS — M9902 Segmental and somatic dysfunction of thoracic region: Secondary | ICD-10-CM

## 2021-11-09 MED ORDER — PREDNISONE 20 MG PO TABS: 20.0000 mg | ORAL_TABLET | Freq: Every day | ORAL | 0 refills | Status: DC

## 2021-11-09 MED ORDER — DOXYCYCLINE HYCLATE 100 MG PO TABS: 100.0000 mg | ORAL_TABLET | Freq: Two times a day (BID) | ORAL | 0 refills | Status: DC

## 2021-11-09 NOTE — Patient Instructions (Addendum)
Xray today Prescriptions have been filled See you again in 1-2 months

## 2021-11-09 NOTE — Assessment & Plan Note (Signed)
Concerned that patient stress is likely contributing to more of this as well.  We will get a repeat x-ray to further evaluate to see if there is any progression of the arthritis since the MRI.  The patient is not having any radicular symptoms and likely more secondary to the tenderness and stress that the patient is having.  Discussed which activities to do and which ones to avoid otherwise.  Follow-up with me again in 6 to 8 weeks.

## 2021-11-24 ENCOUNTER — Other Ambulatory Visit: Payer: Self-pay | Admitting: Family Medicine

## 2021-11-25 ENCOUNTER — Other Ambulatory Visit: Payer: Self-pay | Admitting: Family Medicine

## 2021-12-09 DIAGNOSIS — H2511 Age-related nuclear cataract, right eye: Secondary | ICD-10-CM | POA: Diagnosis not present

## 2021-12-15 NOTE — Progress Notes (Unsigned)
[place] Phone: [phone] Subjective:    I'm seeing this patient by the request  of:  Bernerd Limbo, MD  CC: Low back and neck pain follow-up  [id number]  Daniel [name] is a 72 y.o. male coming in with complaint of back and neck pain. OMT 11/09/2021. Patient states that he has been doing ok. Was told that he needs to get on a statin but has not yet started medication yet.   Would like refill on hydroxizine if he still needs it.   Medications patient has been prescribed: Elavil, Hydroxizine  Taking:         Reviewed prior external information including notes and imaging from previsou exam, outside providers and external EMR if available.   As well as notes that were available from care everywhere and other healthcare systems.  Past medical history, social, surgical and family history all reviewed in electronic medical record.  No pertanent information unless stated regarding to the chief complaint.   Past Medical History:  Diagnosis Date   Acne    Anxiety    DDD (degenerative disc disease), lumbosacral    chronic back pain   Diverticulosis of colon (without mention of hemorrhage)    colo 09/2010   Low back pain radiating to both legs     Allergies  Allergen Reactions   Durezol [Difluprednate] Swelling    These eye drops cause swelling     Review of Systems:  No headache, visual changes, nausea, vomiting, diarrhea, constipation, dizziness, abdominal pain, skin rash, fevers, chills, night sweats, weight loss, swollen lymph nodes, body aches, joint swelling, chest pain, shortness of breath, mood changes. POSITIVE muscle aches  Objective  Blood pressure 110/78, pulse 62, height '5\' 9"'$  (1.753 m), weight 219 lb (99.3 kg), SpO2 96 %.   General: No apparent distress alert and oriented x3 mood and affect normal, dressed appropriately.  HEENT: Pupils equal, extraocular movements intact   Respiratory: Patient's speak in full sentences and does not appear short of breath  Cardiovascular: No lower extremity edema, non tender, no erythema  Gait normal gait MSK:  Back patient's low back does have some loss of lordosis.  Neck exam still has some crepitus noted as well.  Some limited range of motion noted.  Tender to palpation to the paraspinal musculature.  Tightness with FABER test and lacks last 5 degrees of extension of the back as well.  Osteopathic findings  C2 flexed rotated and side bent right C6 flexed rotated and side bent left T3 extended rotated and side bent right inhaled rib T9 extended rotated and side bent left L2 flexed rotated and side bent right Sacrum right on right       Assessment and Plan:  Degenerative disc disease, cervical Degenerative disc disease.  Discussed icing regimen and home exercises, discussed which activities to do and which ones to avoid, increase activity slowly otherwise.  Patient has sent relatively well with conservative therapy.  We will continue to monitor and can follow-up in 4 to 6 weeks    Nonallopathic problems  Decision today to treat with OMT was based on Physical Exam  After verbal consent patient was treated with HVLA, ME, FPR techniques in cervical, rib, thoracic, lumbar, and sacral  areas  Patient tolerated the procedure well with improvement in symptoms  Patient given exercises, stretches and lifestyle modifications  See medications in patient instructions if given  Patient will follow up in 4-8 weeks  The above documentation has been reviewed and is accurate and complete Lyndal Pulley, DO          Note: This dictation was prepared with Dragon dictation along with smaller phrase technology. Any transcriptional errors that result from this process are unintentional.

## 2021-12-16 ENCOUNTER — Ambulatory Visit (INDEPENDENT_AMBULATORY_CARE_PROVIDER_SITE_OTHER): Payer: Medicare Other | Admitting: Family Medicine

## 2021-12-16 VITALS — BP 110/78 | HR 62 | Ht 69.0 in | Wt 219.0 lb

## 2021-12-16 DIAGNOSIS — M9903 Segmental and somatic dysfunction of lumbar region: Secondary | ICD-10-CM | POA: Diagnosis not present

## 2021-12-16 DIAGNOSIS — M5137 Other intervertebral disc degeneration, lumbosacral region: Secondary | ICD-10-CM | POA: Diagnosis not present

## 2021-12-16 DIAGNOSIS — M5030 Other cervical disc degeneration, unspecified cervical region: Secondary | ICD-10-CM | POA: Diagnosis not present

## 2021-12-16 DIAGNOSIS — M9902 Segmental and somatic dysfunction of thoracic region: Secondary | ICD-10-CM | POA: Diagnosis not present

## 2021-12-16 DIAGNOSIS — M9908 Segmental and somatic dysfunction of rib cage: Secondary | ICD-10-CM | POA: Diagnosis not present

## 2021-12-16 DIAGNOSIS — M9901 Segmental and somatic dysfunction of cervical region: Secondary | ICD-10-CM | POA: Diagnosis not present

## 2021-12-16 DIAGNOSIS — M9904 Segmental and somatic dysfunction of sacral region: Secondary | ICD-10-CM

## 2021-12-16 NOTE — Assessment & Plan Note (Signed)
Degenerative disc disease.  Discussed icing regimen and home exercises, discussed which activities to do and which ones to avoid, increase activity slowly otherwise.  Patient has sent relatively well with conservative therapy.  We will continue to monitor and can follow-up in 4 to 6 weeks

## 2021-12-16 NOTE — Patient Instructions (Addendum)
Good to see you! Thanks for keeping me hydrated Dhea '50mg'$  daily for 4 weeks Can ask PCP about testosterone vs clomid If taking a statin take CoQ10 '200mg'$  daily to decrease muscle pain See you again in 5 -6 weeks

## 2022-01-04 DIAGNOSIS — E785 Hyperlipidemia, unspecified: Secondary | ICD-10-CM | POA: Diagnosis not present

## 2022-01-04 DIAGNOSIS — R7989 Other specified abnormal findings of blood chemistry: Secondary | ICD-10-CM | POA: Diagnosis not present

## 2022-01-18 NOTE — Progress Notes (Unsigned)
[place] Phone: [phone] Subjective:   Daniel Daniel [name], am serving as a scribe for [name].  I'm seeing this patient by the request  of:  Bernerd Limbo, MD  CC: Back pain follow-up  [id number]  Daniel Daniel [name] is a 71 y.o. male coming in with complaint of back and neck pain. OMT on 12/16/2021. Patient states that on Thursday he was walking a lot. Developed pain over lateral aspect of R leg. Pain has improved.   Lumbar spine pain worse on R side than the L.   Medications patient has been prescribed:   Taking:         Reviewed prior external information including notes and imaging from previsou exam, outside providers and external EMR if available.   As well as notes that were available from care everywhere and other healthcare systems.  Past medical history, social, surgical and family history all reviewed in electronic medical record.  Daniel [name] unless stated regarding to the chief complaint.   Past Medical History:  Diagnosis Date   Acne    Anxiety    DDD (degenerative disc disease), lumbosacral    chronic back pain   Diverticulosis of colon (without mention of hemorrhage)    colo 09/2010   Low back pain radiating to both legs     Allergies  Allergen Reactions   Durezol [Difluprednate] Swelling    These eye drops cause swelling     Review of Systems:  Daniel [name] headache, visual changes, nausea, vomiting, diarrhea, constipation, dizziness, abdominal pain, skin rash, fevers, chills, night sweats, weight loss, swollen lymph nodes, body aches, joint swelling, chest pain, shortness of breath, mood changes. POSITIVE muscle aches  Objective  Blood pressure 132/86, pulse 64, height '5\' 9"'$  (1.753 m), weight 218 lb (98.9 kg), SpO2 95 %.   General: Daniel [name] alert and oriented x3 mood and affect normal, dressed appropriately.  HEENT: Pupils equal, extraocular movements  intact  Respiratory: Patient's speak in full sentences and does not appear short of breath  Cardiovascular: Daniel [name] lower extremity edema, non tender, Daniel [name] erythema  Gait MSK:  Back does have some loss of lordosis.  Tightness noted on the right side of the paraspinal musculature of the lumbar spine.  Patient does have some limited range of motion of the right hip compared to patient's usual baseline.  Osteopathic findings  C6 flexed rotated and side bent left T3 extended rotated and side bent right inhaled rib T8 extended rotated and side bent left L2 flexed rotated and side bent right Sacrum right on right       Assessment and Plan:  DDD (degenerative disc disease), lumbosacral Chronic problem with exacerbation.  Discussed icing regimen and home exercises.  Which activities to do which ones to avoid.  Increase activity slowly.  Follow-up again in 6 to 8 weeks    Nonallopathic problems  Decision today to treat with OMT was based on Physical Exam  After verbal consent patient was treated with HVLA, ME, FPR techniques in cervical, rib, thoracic, lumbar, and sacral  areas  Patient tolerated the procedure well with improvement in symptoms  Patient given exercises, stretches and lifestyle modifications  See medications in patient instructions if given  Patient will follow up in 4-8 weeks     The above documentation has been reviewed and is accurate and complete Lyndal Pulley, DO         Note: This dictation was  prepared with Dragon dictation along with smaller phrase technology. Any transcriptional errors that result from this process are unintentional.

## 2022-01-19 ENCOUNTER — Ambulatory Visit (INDEPENDENT_AMBULATORY_CARE_PROVIDER_SITE_OTHER): Payer: Medicare Other | Admitting: Family Medicine

## 2022-01-19 ENCOUNTER — Ambulatory Visit (INDEPENDENT_AMBULATORY_CARE_PROVIDER_SITE_OTHER): Payer: Medicare Other

## 2022-01-19 VITALS — BP 132/86 | HR 64 | Ht 69.0 in | Wt 218.0 lb

## 2022-01-19 DIAGNOSIS — M25551 Pain in right hip: Secondary | ICD-10-CM

## 2022-01-19 DIAGNOSIS — M9901 Segmental and somatic dysfunction of cervical region: Secondary | ICD-10-CM

## 2022-01-19 DIAGNOSIS — M9902 Segmental and somatic dysfunction of thoracic region: Secondary | ICD-10-CM | POA: Diagnosis not present

## 2022-01-19 DIAGNOSIS — M9908 Segmental and somatic dysfunction of rib cage: Secondary | ICD-10-CM | POA: Diagnosis not present

## 2022-01-19 DIAGNOSIS — M5137 Other intervertebral disc degeneration, lumbosacral region: Secondary | ICD-10-CM

## 2022-01-19 DIAGNOSIS — M9904 Segmental and somatic dysfunction of sacral region: Secondary | ICD-10-CM

## 2022-01-19 DIAGNOSIS — M9903 Segmental and somatic dysfunction of lumbar region: Secondary | ICD-10-CM

## 2022-01-19 NOTE — Assessment & Plan Note (Addendum)
Chronic problem with exacerbation.  Discussed icing regimen and home exercises.  Which activities to do which ones to avoid.  Increase activity slowly.  Follow-up again in 6 to 8 weeks The tightness in his hip we will get x-rays to further evaluate for any underlying arthritis

## 2022-01-19 NOTE — Patient Instructions (Signed)
Xray today See me in 6-8 weeks

## 2022-01-25 ENCOUNTER — Telehealth: Payer: Self-pay

## 2022-01-26 NOTE — Telephone Encounter (Signed)
Spoke with patient per result.

## 2022-01-27 ENCOUNTER — Other Ambulatory Visit: Payer: Self-pay

## 2022-01-27 ENCOUNTER — Telehealth: Payer: Self-pay | Admitting: Family Medicine

## 2022-01-27 DIAGNOSIS — M5416 Radiculopathy, lumbar region: Secondary | ICD-10-CM

## 2022-01-27 NOTE — Telephone Encounter (Signed)
Spoke with patient and he would like to get another epidural as his leg pain was worse yesterday after moving furniture. Is trying to get some relief prior to furniture market in October. Per a verbal from Dr. Tamala Julian order was placed.

## 2022-01-27 NOTE — Telephone Encounter (Signed)
Patient called stating that he would like to have another epidural injection and would like the same doctor that he had last year. He said that he would like for someone to call him back to discuss this further. Please advise.

## 2022-02-11 DIAGNOSIS — D123 Benign neoplasm of transverse colon: Secondary | ICD-10-CM | POA: Diagnosis not present

## 2022-02-11 DIAGNOSIS — A630 Anogenital (venereal) warts: Secondary | ICD-10-CM | POA: Diagnosis not present

## 2022-02-11 DIAGNOSIS — K635 Polyp of colon: Secondary | ICD-10-CM | POA: Diagnosis not present

## 2022-02-11 DIAGNOSIS — K5730 Diverticulosis of large intestine without perforation or abscess without bleeding: Secondary | ICD-10-CM | POA: Diagnosis not present

## 2022-02-11 DIAGNOSIS — Z1211 Encounter for screening for malignant neoplasm of colon: Secondary | ICD-10-CM | POA: Diagnosis not present

## 2022-02-15 ENCOUNTER — Ambulatory Visit
Admission: RE | Admit: 2022-02-15 | Discharge: 2022-02-15 | Disposition: A | Discharge status: Home / Self Care | Payer: Medicare Other | Source: Ambulatory Visit | Attending: Family Medicine | Admitting: Family Medicine

## 2022-02-15 DIAGNOSIS — M5416 Radiculopathy, lumbar region: Secondary | ICD-10-CM

## 2022-02-15 DIAGNOSIS — M47817 Spondylosis without myelopathy or radiculopathy, lumbosacral region: Secondary | ICD-10-CM | POA: Diagnosis not present

## 2022-02-15 MED ORDER — IOPAMIDOL (ISOVUE-M 200) INJECTION 41%
1.0000 mL | Freq: Once | INTRAMUSCULAR | Status: AC
  Administered 2022-02-15: 1 mL via EPIDURAL

## 2022-02-15 MED ORDER — METHYLPREDNISOLONE ACETATE 40 MG/ML INJ SUSP (RADIOLOG
80.0000 mg | Freq: Once | INTRAMUSCULAR | Status: AC
  Administered 2022-02-15: 80 mg via EPIDURAL

## 2022-02-15 NOTE — Discharge Instructions (Signed)

## 2022-02-22 ENCOUNTER — Ambulatory Visit (INDEPENDENT_AMBULATORY_CARE_PROVIDER_SITE_OTHER): Payer: Medicare Other | Admitting: Family Medicine

## 2022-02-22 ENCOUNTER — Encounter: Payer: Self-pay | Admitting: Family Medicine

## 2022-02-22 VITALS — BP 126/82 | HR 67 | Ht 69.0 in | Wt 213.0 lb

## 2022-02-22 DIAGNOSIS — M5137 Other intervertebral disc degeneration, lumbosacral region: Secondary | ICD-10-CM | POA: Diagnosis not present

## 2022-02-22 DIAGNOSIS — M9908 Segmental and somatic dysfunction of rib cage: Secondary | ICD-10-CM

## 2022-02-22 DIAGNOSIS — M9902 Segmental and somatic dysfunction of thoracic region: Secondary | ICD-10-CM

## 2022-02-22 DIAGNOSIS — Z23 Encounter for immunization: Secondary | ICD-10-CM | POA: Diagnosis not present

## 2022-02-22 DIAGNOSIS — M9901 Segmental and somatic dysfunction of cervical region: Secondary | ICD-10-CM

## 2022-02-22 DIAGNOSIS — M7061 Trochanteric bursitis, right hip: Secondary | ICD-10-CM

## 2022-02-22 DIAGNOSIS — M9904 Segmental and somatic dysfunction of sacral region: Secondary | ICD-10-CM | POA: Diagnosis not present

## 2022-02-22 DIAGNOSIS — M9903 Segmental and somatic dysfunction of lumbar region: Secondary | ICD-10-CM

## 2022-02-22 NOTE — Patient Instructions (Addendum)
Good to see you  Injected R GT today Follow up in 4 weeks but update me in 2 days or so Watch for weakness

## 2022-02-22 NOTE — Assessment & Plan Note (Signed)
Patient does have known degenerative disc disease.  Patient feels that the epidural did help with the back pain but I am still concerned with patient having more of a nerve weakness noted that is new.  We discussed with patient to monitor closely.  Discussed icing regimen and home exercises.  Which activities to do and which ones to avoid.  Increase activity slowly otherwise.  Follow-up again in 6 to 8 weeks.

## 2022-02-22 NOTE — Assessment & Plan Note (Signed)
Worsening discomfort at this point.  Given injection did seem to help out somewhat.  Concerned that some of this is secondary to lumbar radiculopathy and we will need to monitor.  Follow-up again in 6 to 8 weeks.

## 2022-02-22 NOTE — Progress Notes (Addendum)
[place] Phone: [phone] Subjective:   Daniel Daniel [name], am serving as a scribe for [name].  I'm seeing this patient by the request  of:  Bernerd Limbo, MD  CC: back and neck pain   [id number]  Daniel Daniel [name] is a 71 y.o. male coming in with complaint of back and neck pain.  Patient had a right-sided epidural injection at the L4-L5 in September 26.  Patient states patient is having a lot of pain in the right hip and leg, it has been the worse it has ever been. Patient states today it is better than it has been. States he woke up in the morning and his sheets were wet from perspiration from the pain.   Medications patient has been prescribed:   Taking:         Reviewed prior external information including notes and imaging from previsou exam, outside providers and external EMR if available.   As well as notes that were available from care everywhere and other healthcare systems.  Past medical history, social, surgical and family history all reviewed in electronic medical record.  Daniel [name] unless stated regarding to the chief complaint.   Past Medical History:  Diagnosis Date   Acne    Anxiety    DDD (degenerative disc disease), lumbosacral    chronic back pain   Diverticulosis of colon (without mention of hemorrhage)    colo 09/2010   Low back pain radiating to both legs     Allergies  Allergen Reactions   Durezol [Difluprednate] Swelling    These eye drops cause swelling     Review of Systems:  Daniel [name] headache, visual changes, nausea, vomiting, diarrhea, constipation, dizziness, abdominal pain, skin rash, fevers, chills, night sweats, weight loss, swollen lymph nodes, body aches, joint swelling, chest pain, shortness of breath, mood changes. POSITIVE muscle aches  Objective  Blood pressure 126/82, pulse 67, height '5\' 9"'$  (1.753 m), weight 213 lb (96.6 kg), SpO2 96 %.    General: Daniel [name] alert and oriented x3 mood and affect normal, dressed appropriately.  HEENT: Pupils equal, extraocular movements intact  Respiratory: Patient's speak in full sentences and does not appear short of breath  Cardiovascular: Daniel [name] lower extremity edema, non tender, Daniel [name] erythema  Back patient does have back pain noted.  More pain over the right greater trochanteric area.  Positive FABER test noted.  Patient though does have weakness with dorsiflexion of the foot.  Patient's deep tendon reflexes though are intact.  Osteopathic findings  C2 flexed rotated and side bent right C5 flexed rotated and side bent left T3 extended rotated and side bent right inhaled rib T8 extended rotated and side bent left L2 flexed rotated and side bent right Sacrum right on right   After verbal consent patient was prepped with alcohol swab and with a 21-gauge 2 inch needle injected into the right greater trochanteric area with 2 cc of 0.5% Marcaine and 1 cc of Kenalog 40 mg/mL.  Daniel [name] blood loss.  Band-Aid placed.  Postinjection instructions given     Assessment and Plan:  DDD (degenerative disc disease), lumbosacral Patient does have known degenerative disc disease.  Patient feels that the epidural did help with the back pain but I am still concerned with patient having more of a nerve weakness noted that is new.  We discussed with patient to monitor closely.  Discussed icing regimen and home exercises.  Which  activities to do and which ones to avoid.  Increase activity slowly otherwise.  Follow-up again in 6 to 8 weeks.  Greater trochanteric bursitis of right hip Worsening discomfort at this point.  Given injection did seem to help out somewhat.  Concerned that some of this is secondary to lumbar radiculopathy and we will need to monitor.  Follow-up again in 6 to 8 weeks.    Nonallopathic problems  Decision today to treat with OMT was based on Physical Exam  After verbal consent  patient was treated with HVLA, ME, FPR techniques in cervical, rib, thoracic, lumbar, and sacral  areas  Patient tolerated the procedure well with improvement in symptoms  Patient given exercises, stretches and lifestyle modifications  See medications in patient instructions if given  Patient will follow up in 4-8 weeks    The above documentation has been reviewed and is accurate and complete Lyndal Pulley, DO          Note: This dictation was prepared with Dragon dictation along with smaller phrase technology. Any transcriptional errors that result from this process are unintentional.

## 2022-02-24 ENCOUNTER — Telehealth: Payer: Self-pay | Admitting: Family Medicine

## 2022-02-24 ENCOUNTER — Ambulatory Visit: Payer: Medicare Other | Admitting: Family Medicine

## 2022-02-24 DIAGNOSIS — A630 Anogenital (venereal) warts: Secondary | ICD-10-CM | POA: Diagnosis not present

## 2022-02-24 NOTE — Telephone Encounter (Signed)
Pt called with update. He feels about 60% better, is able to move his foot in the way Dr. Tamala Julian had wanted.  Still has a "catch" in his hip.

## 2022-02-24 NOTE — Telephone Encounter (Signed)
Spoke with patient.

## 2022-03-01 ENCOUNTER — Other Ambulatory Visit: Payer: Self-pay

## 2022-03-01 ENCOUNTER — Telehealth: Payer: Self-pay | Admitting: Family Medicine

## 2022-03-01 DIAGNOSIS — M25551 Pain in right hip: Secondary | ICD-10-CM

## 2022-03-01 DIAGNOSIS — M545 Low back pain, unspecified: Secondary | ICD-10-CM

## 2022-03-01 MED ORDER — PREDNISONE 20 MG PO TABS: 40.0000 mg | ORAL_TABLET | Freq: Every day | ORAL | 0 refills | Status: DC

## 2022-03-01 NOTE — Telephone Encounter (Signed)
Rx filled. Patient notified.  

## 2022-03-01 NOTE — Telephone Encounter (Signed)
Patient called, hip is not getting better. Pain still running down his leg. Wonders if he should get an MRI.

## 2022-03-13 ENCOUNTER — Ambulatory Visit
Admission: RE | Admit: 2022-03-13 | Discharge: 2022-03-13 | Disposition: A | Discharge status: Home / Self Care | Payer: Medicare Other | Source: Ambulatory Visit | Attending: Family Medicine | Admitting: Family Medicine

## 2022-03-13 DIAGNOSIS — K5730 Diverticulosis of large intestine without perforation or abscess without bleeding: Secondary | ICD-10-CM | POA: Diagnosis not present

## 2022-03-13 DIAGNOSIS — M545 Low back pain, unspecified: Secondary | ICD-10-CM

## 2022-03-13 DIAGNOSIS — M48061 Spinal stenosis, lumbar region without neurogenic claudication: Secondary | ICD-10-CM | POA: Diagnosis not present

## 2022-03-13 DIAGNOSIS — M160 Bilateral primary osteoarthritis of hip: Secondary | ICD-10-CM | POA: Diagnosis not present

## 2022-03-13 DIAGNOSIS — M533 Sacrococcygeal disorders, not elsewhere classified: Secondary | ICD-10-CM | POA: Diagnosis not present

## 2022-03-13 DIAGNOSIS — M25551 Pain in right hip: Secondary | ICD-10-CM

## 2022-03-15 ENCOUNTER — Other Ambulatory Visit: Payer: Self-pay

## 2022-03-15 DIAGNOSIS — M5416 Radiculopathy, lumbar region: Secondary | ICD-10-CM

## 2022-03-15 NOTE — Progress Notes (Signed)
  [name] [place] Phone: [phone] Subjective:   Daniel [name], am serving as a scribe for [name].  I'm seeing this patient by the request  of:  Bernerd Limbo, MD  CC: back and neck pain   [id number]  Daniel [name] is a 71 y.o. male coming in with complaint of back and neck pain. OMT on 02/22/2022. Patient states that since the epidural has been doing better. Patient has another epidural Thursday for the other location to help with the pain that travels down his right leg.  Medications patient has been prescribed: prednisone  Taking: none          Reviewed prior external information including notes and imaging from previsou exam, outside providers and external EMR if available.   As well as notes that were available from care everywhere and other healthcare systems.  Past medical history, social, surgical and family history all reviewed in electronic medical record.  No pertanent information unless stated regarding to the chief complaint.   Past Medical History:  Diagnosis Date   Acne    Anxiety    DDD (degenerative disc disease), lumbosacral    chronic back pain   Diverticulosis of colon (without mention of hemorrhage)    colo 09/2010   Low back pain radiating to both legs     Allergies  Allergen Reactions   Durezol [Difluprednate] Swelling    These eye drops cause swelling     Review of Systems:  No headache, visual changes, nausea, vomiting, diarrhea, constipation, dizziness, abdominal pain, skin rash, fevers, chills, night sweats, weight loss, swollen lymph nodes, body aches, joint swelling, chest pain, shortness of breath, mood changes. POSITIVE muscle aches  Objective  There were no vitals taken for this visit.   General: No apparent distress alert and oriented x3 mood and affect normal, dressed appropriately.  HEENT: Pupils equal, extraocular movements intact  Respiratory:  Patient's speak in full sentences and does not appear short of breath  Cardiovascular: No lower extremity edema, non tender, no erythema  Neck exam does have some loss of lordosis.  Patient's low back does have some loss of lordosis with some worsening pain with extension.  Osteopathic findings  C2 flexed rotated and side bent right C5 flexed rotated and side bent left  T8 extended rotated and side bent left inhaled rib     Assessment and Plan:  No problem-specific Assessment & Plan notes found for this encounter.    Nonallopathic problems  Decision today to treat with OMT was based on Physical Exam  After verbal consent patient was treated with HVLA, ME, FPR techniques in cervical, rib, thoracic areas  Patient tolerated the procedure well with improvement in symptoms  Patient given exercises, stretches and lifestyle modifications  See medications in patient instructions if given  Patient will follow up in 4-8 weeks     The above documentation has been reviewed and is accurate and complete Lyndal Pulley, DO         Note: This dictation was prepared with Dragon dictation along with smaller phrase technology. Any transcriptional errors that result from this process are unintentional.

## 2022-03-16 DIAGNOSIS — Z23 Encounter for immunization: Secondary | ICD-10-CM | POA: Diagnosis not present

## 2022-03-21 ENCOUNTER — Ambulatory Visit (INDEPENDENT_AMBULATORY_CARE_PROVIDER_SITE_OTHER): Payer: Medicare Other | Admitting: Family Medicine

## 2022-03-21 ENCOUNTER — Encounter: Payer: Self-pay | Admitting: Family Medicine

## 2022-03-21 VITALS — BP 110/70 | HR 80 | Ht 69.0 in | Wt 218.0 lb

## 2022-03-21 DIAGNOSIS — M9901 Segmental and somatic dysfunction of cervical region: Secondary | ICD-10-CM | POA: Diagnosis not present

## 2022-03-21 DIAGNOSIS — M9902 Segmental and somatic dysfunction of thoracic region: Secondary | ICD-10-CM | POA: Diagnosis not present

## 2022-03-21 DIAGNOSIS — M9904 Segmental and somatic dysfunction of sacral region: Secondary | ICD-10-CM | POA: Diagnosis not present

## 2022-03-21 DIAGNOSIS — M5030 Other cervical disc degeneration, unspecified cervical region: Secondary | ICD-10-CM | POA: Diagnosis not present

## 2022-03-21 DIAGNOSIS — M5137 Other intervertebral disc degeneration, lumbosacral region: Secondary | ICD-10-CM | POA: Diagnosis not present

## 2022-03-21 NOTE — Patient Instructions (Addendum)
Good to see you  Lets see how this injection does Follow up in 4-6 weeks

## 2022-03-21 NOTE — Assessment & Plan Note (Signed)
Known arthritic changes.  Responded extremely well though to osteopathic manipulation is one of the treatment options today.  Discussed with patient about different over-the-counter medications as well as prescription medications.  Tylenol 500 mg to 1000 mg every 8 hours.  We discussed posture and ergonomics.  Follow-up again in 6 to 8 weeks

## 2022-03-21 NOTE — Assessment & Plan Note (Signed)
Low back pain and MRI did show the patient does have some adjacent segment disease of patient's previous symptoms that are corresponding with his hip pain.  We discussed with patient that I do think at this point we should consider an epidural at a higher level.  Patient is already scheduled for Thursday.  Deferred the back at this time.  When it comes to manipulation.  Follow-up again in 6 to 8 weeks

## 2022-03-22 ENCOUNTER — Other Ambulatory Visit: Payer: Self-pay | Admitting: Family Medicine

## 2022-03-23 ENCOUNTER — Ambulatory Visit
Admission: RE | Admit: 2022-03-23 | Discharge: 2022-03-23 | Disposition: A | Discharge status: Home / Self Care | Payer: Medicare Other | Source: Ambulatory Visit | Attending: Family Medicine | Admitting: Family Medicine

## 2022-03-23 DIAGNOSIS — M5416 Radiculopathy, lumbar region: Secondary | ICD-10-CM

## 2022-03-23 DIAGNOSIS — M4727 Other spondylosis with radiculopathy, lumbosacral region: Secondary | ICD-10-CM | POA: Diagnosis not present

## 2022-03-23 MED ORDER — METHYLPREDNISOLONE ACETATE 40 MG/ML INJ SUSP (RADIOLOG
80.0000 mg | Freq: Once | INTRAMUSCULAR | Status: AC
  Administered 2022-03-23: 80 mg via EPIDURAL

## 2022-03-23 MED ORDER — IOPAMIDOL (ISOVUE-M 200) INJECTION 41%
1.0000 mL | Freq: Once | INTRAMUSCULAR | Status: AC
  Administered 2022-03-23: 1 mL via EPIDURAL

## 2022-03-23 NOTE — Discharge Instructions (Signed)

## 2022-03-29 ENCOUNTER — Telehealth: Payer: Self-pay | Admitting: Family Medicine

## 2022-03-29 ENCOUNTER — Other Ambulatory Visit: Payer: Self-pay

## 2022-03-29 MED ORDER — PREDNISONE 50 MG PO TABS: ORAL_TABLET | ORAL | 0 refills | Status: DC

## 2022-03-29 NOTE — Telephone Encounter (Signed)
Patient called stating that the epidural has not helped. He is still having pain in his hip and back.  Please advise on next steps.

## 2022-03-29 NOTE — Telephone Encounter (Signed)
Patient moved a rug on Saturday. Has been in pain since. Would like to start with prednisone. Rx called in per a verbal from Dr. Tamala Julian.

## 2022-04-04 NOTE — Telephone Encounter (Signed)
Patient called stating that his last day of prednisone was yesterday and has had no improvement.  He is using ibuprofen and that is only giving him a little relief. He would like to know what else to do?  Please advise.

## 2022-04-06 ENCOUNTER — Other Ambulatory Visit: Payer: Self-pay

## 2022-04-06 DIAGNOSIS — E559 Vitamin D deficiency, unspecified: Secondary | ICD-10-CM

## 2022-04-06 DIAGNOSIS — E038 Other specified hypothyroidism: Secondary | ICD-10-CM

## 2022-04-06 DIAGNOSIS — M2550 Pain in unspecified joint: Secondary | ICD-10-CM

## 2022-04-06 NOTE — Telephone Encounter (Signed)
Spoke with patient. Prednisone did not alleviate his pain. Using IBU which seems to be helping. Labwork ordered and he will be coming in tomorrow to get them drawn.

## 2022-04-07 ENCOUNTER — Other Ambulatory Visit (INDEPENDENT_AMBULATORY_CARE_PROVIDER_SITE_OTHER): Payer: Medicare Other

## 2022-04-07 DIAGNOSIS — M2550 Pain in unspecified joint: Secondary | ICD-10-CM

## 2022-04-07 DIAGNOSIS — E038 Other specified hypothyroidism: Secondary | ICD-10-CM

## 2022-04-07 DIAGNOSIS — E559 Vitamin D deficiency, unspecified: Secondary | ICD-10-CM | POA: Diagnosis not present

## 2022-04-07 LAB — CBC WITH DIFFERENTIAL/PLATELET
Basophils Absolute: 0 10*3/uL (ref 0.0–0.1)
Basophils Relative: 0.3 % (ref 0.0–3.0)
Eosinophils Absolute: 0.2 10*3/uL (ref 0.0–0.7)
Eosinophils Relative: 2.8 % (ref 0.0–5.0)
HCT: 49.2 % (ref 39.0–52.0)
Hemoglobin: 16.4 g/dL (ref 13.0–17.0)
Lymphocytes Relative: 27 % (ref 12.0–46.0)
Lymphs Abs: 1.7 10*3/uL (ref 0.7–4.0)
MCHC: 33.4 g/dL (ref 30.0–36.0)
MCV: 93.5 fl (ref 78.0–100.0)
Monocytes Absolute: 0.5 10*3/uL (ref 0.1–1.0)
Monocytes Relative: 7.5 % (ref 3.0–12.0)
Neutro Abs: 3.8 10*3/uL (ref 1.4–7.7)
Neutrophils Relative %: 62.4 % (ref 43.0–77.0)
Platelets: 218 10*3/uL (ref 150.0–400.0)
RBC: 5.26 Mil/uL (ref 4.22–5.81)
RDW: 14.2 % (ref 11.5–15.5)
WBC: 6.1 10*3/uL (ref 4.0–10.5)

## 2022-04-07 LAB — IBC PANEL
Iron: 52 ug/dL (ref 42–165)
Saturation Ratios: 15.4 % — ABNORMAL LOW (ref 20.0–50.0)
TIBC: 337.4 ug/dL (ref 250.0–450.0)
Transferrin: 241 mg/dL (ref 212.0–360.0)

## 2022-04-07 LAB — COMPREHENSIVE METABOLIC PANEL
ALT: 25 U/L (ref 0–53)
AST: 18 U/L (ref 0–37)
Albumin: 4 g/dL (ref 3.5–5.2)
Alkaline Phosphatase: 56 U/L (ref 39–117)
BUN: 17 mg/dL (ref 6–23)
CO2: 30 mEq/L (ref 19–32)
Calcium: 9.2 mg/dL (ref 8.4–10.5)
Chloride: 101 mEq/L (ref 96–112)
Creatinine, Ser: 0.97 mg/dL (ref 0.40–1.50)
GFR: 79.86 mL/min (ref >60.00)
Glucose, Bld: 87 mg/dL (ref 70–99)
Potassium: 4.3 mEq/L (ref 3.5–5.1)
Sodium: 138 mEq/L (ref 135–145)
Total Bilirubin: 0.5 mg/dL (ref 0.2–1.2)
Total Protein: 6.9 g/dL (ref 6.0–8.3)

## 2022-04-07 LAB — TSH: TSH: 1.74 u[IU]/mL (ref 0.35–5.50)

## 2022-04-07 LAB — VITAMIN D 25 HYDROXY (VIT D DEFICIENCY, FRACTURES): VITD: 35.91 ng/mL (ref 30.00–100.00)

## 2022-04-07 LAB — SEDIMENTATION RATE: Sed Rate: 41 mm/hr — ABNORMAL HIGH (ref 0–20)

## 2022-04-07 LAB — URIC ACID: Uric Acid, Serum: 5.4 mg/dL (ref 4.0–7.8)

## 2022-04-07 LAB — VITAMIN B12: Vitamin B-12: 291 pg/mL (ref 211–911)

## 2022-04-07 LAB — TESTOSTERONE: Testosterone: 48.16 ng/dL — ABNORMAL LOW (ref 300.00–890.00)

## 2022-04-07 LAB — FERRITIN: Ferritin: 175.4 ng/mL (ref 22.0–322.0)

## 2022-04-07 LAB — C-REACTIVE PROTEIN: CRP: 1.5 mg/dL (ref 0.5–20.0)
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
## 2022-04-09 LAB — ANA: Anti Nuclear Antibody (ANA): NEGATIVE

## 2022-04-09 LAB — RHEUMATOID FACTOR: Rheumatoid fact SerPl-aCnc: <14 IU/mL (ref <14)

## 2022-04-09 LAB — CALCIUM, IONIZED: Calcium, Ion: 5.1 mg/dL (ref 4.7–5.5)

## 2022-04-09 LAB — PTH, INTACT AND CALCIUM
Calcium: 9.3 mg/dL (ref 8.6–10.3)
PTH: 31 pg/mL (ref 16–77)

## 2022-04-09 LAB — ANGIOTENSIN CONVERTING ENZYME: Angiotensin-Converting Enzyme: 28 U/L (ref 9–67)

## 2022-04-09 LAB — CYCLIC CITRUL PEPTIDE ANTIBODY, IGG: Cyclic Citrullin Peptide Ab: <16 UNITS

## 2022-04-11 ENCOUNTER — Ambulatory Visit: Payer: Self-pay

## 2022-04-11 ENCOUNTER — Ambulatory Visit (INDEPENDENT_AMBULATORY_CARE_PROVIDER_SITE_OTHER): Payer: Medicare Other | Admitting: Family Medicine

## 2022-04-11 VITALS — BP 122/84 | Ht 69.0 in | Wt 218.0 lb

## 2022-04-11 DIAGNOSIS — M25551 Pain in right hip: Secondary | ICD-10-CM | POA: Diagnosis not present

## 2022-04-11 DIAGNOSIS — R7989 Other specified abnormal findings of blood chemistry: Secondary | ICD-10-CM

## 2022-04-11 DIAGNOSIS — M545 Low back pain, unspecified: Secondary | ICD-10-CM | POA: Diagnosis not present

## 2022-04-11 DIAGNOSIS — M5137 Other intervertebral disc degeneration, lumbosacral region: Secondary | ICD-10-CM | POA: Diagnosis not present

## 2022-04-11 MED ORDER — TRAMADOL HCL 50 MG PO TABS: 50.0000 mg | ORAL_TABLET | Freq: Four times a day (QID) | ORAL | 0 refills | Status: DC | PRN

## 2022-04-11 NOTE — Assessment & Plan Note (Signed)
Patient is having more weakness of the right lower extremity.  More pain also noted in the area.  We discussed again about the potential for an injection on the side of the hip but did not make any significant improvement.  Patient now has weakness that seems to be in the L5 variant on the right side.  Clinically DTRs are intact.  Patient though has failed all conservative therapy including formal physical therapy, multiple medications, and prednisone with weakness that it feels that surgical intervention may be necessary.  Has also failed to epidurals that did not give him significant improvement.  Patient knows if worsening symptoms to seek medical attention sooner but will be referred to neurosurgery to discuss further management options.  Total time reviewing patient's imaging and discussed with patient 32 minutes

## 2022-04-11 NOTE — Patient Instructions (Addendum)
[facility]  Tramadol with Tylenol  See me again in 6-8 weeks

## 2022-04-11 NOTE — Assessment & Plan Note (Signed)
Low testosterone noted.  Discussed his testosterone is at 48.  Will refer to urology to see if supplementation is necessary in this individual who continues to gain weight, increasing weakness, as well as the difficulty with psychosocial aspects.

## 2022-04-12 DIAGNOSIS — K629 Disease of anus and rectum, unspecified: Secondary | ICD-10-CM | POA: Diagnosis not present

## 2022-04-13 ENCOUNTER — Telehealth: Payer: Self-pay | Admitting: Family Medicine

## 2022-04-13 NOTE — Telephone Encounter (Signed)
Patient called to let Dr Tamala Julian know that he is scheduled with Dr Saintclair Halsted at [facility] on November 30th.

## 2022-04-18 ENCOUNTER — Telehealth: Payer: Self-pay | Admitting: Family Medicine

## 2022-04-18 DIAGNOSIS — D225 Melanocytic nevi of trunk: Secondary | ICD-10-CM | POA: Diagnosis not present

## 2022-04-18 DIAGNOSIS — L718 Other rosacea: Secondary | ICD-10-CM | POA: Diagnosis not present

## 2022-04-18 DIAGNOSIS — D1801 Hemangioma of skin and subcutaneous tissue: Secondary | ICD-10-CM | POA: Diagnosis not present

## 2022-04-18 DIAGNOSIS — L821 Other seborrheic keratosis: Secondary | ICD-10-CM | POA: Diagnosis not present

## 2022-04-18 DIAGNOSIS — L814 Other melanin hyperpigmentation: Secondary | ICD-10-CM | POA: Diagnosis not present

## 2022-04-18 NOTE — Telephone Encounter (Signed)
What doctor did Dr Tamala Julian want the patient to see at [facility]? He said that Dr Tamala Julian mentioned a particular doctor when he was here.  Please advise.

## 2022-04-19 NOTE — Telephone Encounter (Signed)
Spoke with patient.

## 2022-04-21 DIAGNOSIS — M5440 Lumbago with sciatica, unspecified side: Secondary | ICD-10-CM | POA: Diagnosis not present

## 2022-04-21 DIAGNOSIS — M1610 Unilateral primary osteoarthritis, unspecified hip: Secondary | ICD-10-CM | POA: Diagnosis not present

## 2022-04-21 NOTE — Telephone Encounter (Signed)
Patient called stating that he just saw Dr Saintclair Halsted and he believes there is some arthritis that could be cleaned out but he is thinking it is more in his hip and it referring him to Dr Jean Rosenthal.   Just FYI.

## 2022-04-22 ENCOUNTER — Telehealth: Payer: Self-pay | Admitting: Family Medicine

## 2022-04-22 MED ORDER — TRAMADOL HCL 50 MG PO TABS: 50.0000 mg | ORAL_TABLET | Freq: Four times a day (QID) | ORAL | 0 refills | Status: DC | PRN

## 2022-04-22 NOTE — Telephone Encounter (Signed)
Refilled

## 2022-04-22 NOTE — Telephone Encounter (Signed)
Patient called requesting a refill on traMADol (ULTRAM) 50 MG tablet.  Pharmacy: [place]

## 2022-04-29 DIAGNOSIS — R85613 High grade squamous intraepithelial lesion on cytologic smear of anus (HGSIL): Secondary | ICD-10-CM | POA: Diagnosis not present

## 2022-04-29 DIAGNOSIS — K6282 Dysplasia of anus: Secondary | ICD-10-CM | POA: Diagnosis not present

## 2022-04-29 DIAGNOSIS — K6289 Other specified diseases of anus and rectum: Secondary | ICD-10-CM | POA: Diagnosis not present

## 2022-05-02 ENCOUNTER — Telehealth: Payer: Self-pay | Admitting: Orthopaedic Surgery

## 2022-05-02 ENCOUNTER — Other Ambulatory Visit: Payer: Self-pay | Admitting: Orthopaedic Surgery

## 2022-05-02 ENCOUNTER — Ambulatory Visit (INDEPENDENT_AMBULATORY_CARE_PROVIDER_SITE_OTHER): Payer: Medicare Other | Admitting: Orthopaedic Surgery

## 2022-05-02 ENCOUNTER — Encounter: Payer: Self-pay | Admitting: Orthopaedic Surgery

## 2022-05-02 DIAGNOSIS — M1611 Unilateral primary osteoarthritis, right hip: Secondary | ICD-10-CM | POA: Diagnosis not present

## 2022-05-02 MED ORDER — ACETAMINOPHEN-CODEINE 300-30 MG PO TABS: 1.0000 | ORAL_TABLET | Freq: Three times a day (TID) | ORAL | 0 refills | Status: DC | PRN

## 2022-05-02 MED ORDER — METHOCARBAMOL 500 MG PO TABS: 500.0000 mg | ORAL_TABLET | Freq: Four times a day (QID) | ORAL | 1 refills | Status: DC | PRN

## 2022-05-02 NOTE — Telephone Encounter (Signed)
Called pharmacy and confirmed this. Please send to other pharmacy

## 2022-05-02 NOTE — Telephone Encounter (Signed)
Patient called advised the pharmacy do not have the medication in stock and do not know when it will be back in stock. Patient asked if the Rx for Tylenol with codeine can be called into [facility]   [place]   Ph# is [phone]   The number to contact patient is [phone]

## 2022-05-02 NOTE — Progress Notes (Signed)
Office Visit Note   Patient: Daniel [name]           Date of Birth: November 21, 1950           MRN: [id number] Visit Date: 05/02/2022              Requested by: Bernerd Limbo, MD [address] [place] [address] [place],  [place] [id number] PCP: Bernerd Limbo, MD   Assessment & Plan: Visit Diagnoses:  1. Unilateral primary osteoarthritis, right hip     Plan: The patient does have severe arthritis of his right hip and we are recommending a total hip arthroplasty.  I explained in detail the rationale behind this recommendation as well as discussed what to expect from an intraoperative and postoperative course.  I went over his x-rays and MRI findings as well as a hip replacement model.  I gave him a handout about hip replacement surgery as well.  We discussed the risk and benefits of the surgery in detail.  He does wish to get on the operating room schedule soon.  I will send in some Tylenol 3 and a muscle relaxant to help in the interim.  All questions and concerns were answered and addressed.  Follow-Up Instructions: Return for 2 weeks post-op.   Orders:  No orders of the defined types were placed in this encounter.  No orders of the defined types were placed in this encounter.     Procedures: No procedures performed   Clinical Data: No additional findings.   Subjective: Chief Complaint  Patient presents with   Right Hip - Pain   Lower Back - Pain  The patient is a very pleasant 71 year old gentleman sent to me from Dr. Dominica Severin cram with neurosurgery to evaluate right hip pain.  He has been dealing with low back pain and hip pain but his hip is bothering him the most with pain in the hip in the groin area.  He is also seen [name] primary care sports medicine physician who has provided steroid injections right hip joint.  He has been getting worse for 4 to 5 months now and he is even had an ESI in his lumbar spine.  He does have a MRI of his pelvis showing severe arthritis of  his right hip.  He denies any left hip pain.  He does report groin pain.  At this point his right hip pain is detrimentally affecting his mobility, his quality of life and his activities day living.  He has failed over 6 months of treatment for this as well.  He is not on blood thinning medication and not diabetic.  It does wake him up at night in terms of the hip pain and it hurts with weightbearing and mobility activities.  HPI  Review of Systems There is currently listed no fever, chills, nausea, vomiting  Objective: Vital Signs: There were no vitals taken for this visit.  Physical Exam He is alert and orient x 3 and in no acute distress Ortho Exam Examination of his left hip is normal.  Examination of his right hip shows significant stiffness and blocks to rotation in terms of severe pain with internal ex rotation of the right hip. Specialty Comments:  No specialty comments available.  Imaging: No results found. I did review the MRI of his right hip with him.  There is severe arthritic changes in the right hip that are not seen on plain films.  There is subchondral edema in the femoral head and  acetabulum as well as cystic changes in the femoral head.  There is areas of full-thickness cartilage loss at the weightbearing surface of the femoral head.  PMFS History: Patient Active Problem List   Diagnosis Date Noted   Unilateral primary osteoarthritis, right hip 05/02/2022   Low testosterone in male 04/11/2022   AC joint pain 10/02/2021   Greater trochanteric bursitis of left hip 10/20/2020   Right lateral epicondylitis 06/26/2018   Nonallopathic lesion of thoracic region 12/04/2017   PAF (paroxysmal atrial fibrillation) ([place]) 10/31/2016   Chest pain due to coronary artery disease ([place]) 10/30/2016   Greater trochanteric bursitis of right hip 10/18/2016   Synovitis of left knee 07/05/2016   Degenerative disc disease, cervical 01/04/2016   Shoulder bursitis 07/10/2015    Polyarthralgia 07/10/2015   Episodic tension-type headache, not intractable 05/07/2015   Acute meniscal tear of right knee 05/06/2015   Neck pain of over 3 months duration 11/17/2014   Nonallopathic lesion of sacral region 11/17/2014   Nonallopathic lesion of lumbosacral region 11/17/2014   Nonallopathic lesion-rib cage 11/17/2014   Nonallopathic lesion of cervical region 11/17/2014   Left elbow pain 11/04/2014   Postconcussion syndrome 02/14/2014   Head injury 01/06/2014   BMI 31.0-31.9,adult 04/24/2013   DDD (degenerative disc disease), lumbosacral 04/19/2011   Diverticulosis of colon (without mention of hemorrhage) 10/11/2010   Routine general medical examination at a health care facility 09/22/2010   Past Medical History:  Diagnosis Date   Acne    Anxiety    DDD (degenerative disc disease), lumbosacral    chronic back pain   Diverticulosis of colon (without mention of hemorrhage)    colo 09/2010   Low back pain radiating to both legs     Family History  Problem Relation Age of Onset   Diabetes Mother    Hypertension Mother    Arthritis Mother    Arthritis Father    Heart disease Father    Diabetes Sister    Cancer Neg Hx    Stroke Neg Hx    Kidney disease Neg Hx    Early death Neg Hx     Past Surgical History:  Procedure Laterality Date   CATARACT EXTRACTION Left 07/14/2015   CYST EXCISION Left 06/23/2015   Procedure: EXCISION MASS LEFT INDEX FINGER;  Surgeon: Daryll Brod, MD;  Location: [place];  Service: Orthopedics;  Laterality: Left;  ANESTHESIA: IV REGIONAL/FAB   HERNIA REPAIR     REFRACTIVE SURGERY     left eye/ with enhancement   TONSILLECTOMY     Social History   Occupational History   Not on file  Tobacco Use   Smoking status: Never   Smokeless tobacco: Never  Vaping Use   Vaping Use: Never used  Substance and Sexual Activity   Alcohol use: Yes    Comment: weekly   Drug use: No   Sexual activity: Not on file

## 2022-05-12 DIAGNOSIS — E291 Testicular hypofunction: Secondary | ICD-10-CM | POA: Diagnosis not present

## 2022-05-12 DIAGNOSIS — R948 Abnormal results of function studies of other organs and systems: Secondary | ICD-10-CM | POA: Diagnosis not present

## 2022-05-17 DIAGNOSIS — R509 Fever, unspecified: Secondary | ICD-10-CM | POA: Diagnosis not present

## 2022-05-17 DIAGNOSIS — R6889 Other general symptoms and signs: Secondary | ICD-10-CM | POA: Diagnosis not present

## 2022-05-18 ENCOUNTER — Other Ambulatory Visit: Payer: Self-pay

## 2022-05-18 ENCOUNTER — Other Ambulatory Visit: Payer: Self-pay | Admitting: Urology

## 2022-05-18 ENCOUNTER — Telehealth: Payer: Self-pay | Admitting: Orthopaedic Surgery

## 2022-05-18 DIAGNOSIS — E291 Testicular hypofunction: Secondary | ICD-10-CM

## 2022-05-18 MED ORDER — METHOCARBAMOL 500 MG PO TABS: 500.0000 mg | ORAL_TABLET | Freq: Four times a day (QID) | ORAL | 1 refills | Status: DC | PRN

## 2022-05-18 NOTE — Telephone Encounter (Signed)
Pt called requesting a call back about his medication. Pt states he received a letter and need a call from [facility] or [facility]. Pt phone number is [phone]

## 2022-05-18 NOTE — Telephone Encounter (Signed)
I called and talked to the pt. He stated he needs his methocarbomol sent to his [place] [facility]. This was done

## 2022-05-25 ENCOUNTER — Ambulatory Visit (INDEPENDENT_AMBULATORY_CARE_PROVIDER_SITE_OTHER): Payer: Medicare Other | Admitting: Family Medicine

## 2022-05-25 VITALS — BP 120/82 | HR 67 | Ht 69.0 in | Wt 209.0 lb

## 2022-05-25 DIAGNOSIS — R7989 Other specified abnormal findings of blood chemistry: Secondary | ICD-10-CM

## 2022-05-25 DIAGNOSIS — M5137 Other intervertebral disc degeneration, lumbosacral region: Secondary | ICD-10-CM | POA: Diagnosis not present

## 2022-05-25 DIAGNOSIS — M9904 Segmental and somatic dysfunction of sacral region: Secondary | ICD-10-CM

## 2022-05-25 DIAGNOSIS — M9903 Segmental and somatic dysfunction of lumbar region: Secondary | ICD-10-CM

## 2022-05-25 DIAGNOSIS — M9908 Segmental and somatic dysfunction of rib cage: Secondary | ICD-10-CM

## 2022-05-25 DIAGNOSIS — M9901 Segmental and somatic dysfunction of cervical region: Secondary | ICD-10-CM

## 2022-05-25 DIAGNOSIS — M9902 Segmental and somatic dysfunction of thoracic region: Secondary | ICD-10-CM | POA: Diagnosis not present

## 2022-05-25 MED ORDER — TRAMADOL HCL 50 MG PO TABS: 50.0000 mg | ORAL_TABLET | Freq: Four times a day (QID) | ORAL | 0 refills | Status: DC | PRN

## 2022-05-25 NOTE — Progress Notes (Signed)
[name] [place] Phone: [phone] Subjective:   Daniel [name], am serving as a scribe for [name].  I'm seeing this patient by the request  of:  Bernerd Limbo, MD  CC: Low back pain  [id number]  Luisdaniel Kenton is a 72 y.o. male coming in with complaint of back and neck pain Patient states that he is scheduled for total hip replacement on 07/12/22. Patient would like to go over his lab results.  Patient was seen by orthopedics for his right hip pain and does have severe right hip arthritis.  Patient is seeing Dr. Ninfa Linden.  Has been given prescriptions with Tylenol with codeine as well as Robaxin. Patient is not wanting to take all the pain medications    Medications patient has been prescribed:   Taking:         Reviewed prior external information including notes and imaging from previsou exam, outside providers and external EMR if available.   As well as notes that were available from care everywhere and other healthcare systems.  Past medical history, social, surgical and family history all reviewed in electronic medical record.  No pertanent information unless stated regarding to the chief complaint.   Past Medical History:  Diagnosis Date   Acne    Anxiety    DDD (degenerative disc disease), lumbosacral    chronic back pain   Diverticulosis of colon (without mention of hemorrhage)    colo 09/2010   Low back pain radiating to both legs     Allergies  Allergen Reactions   Durezol [Difluprednate] Swelling    These eye drops cause swelling     Review of Systems:  No headache, visual changes, nausea, vomiting, diarrhea, constipation, dizziness, abdominal pain, skin rash, fevers, chills, night sweats, weight loss, swollen lymph nodes, body aches, joint swelling, chest pain, shortness of breath, mood changes. POSITIVE muscle aches  Objective  Blood pressure 120/82, pulse 67, height 5'  9" (1.753 m), weight 209 lb (94.8 kg), SpO2 95 %.   General: No apparent distress alert and oriented x3 mood and affect normal, dressed appropriately.  HEENT: Pupils equal, extraocular movements intact  Respiratory: Patient's speak in full sentences and does not appear short of breath  Cardiovascular: No lower extremity edema, non tender, no erythema  Low back exam does have some loss of lordosis and some tenderness to palpation.  Patient is ambulating with a severely antalgic gait noted.  Patient is favoring the right leg significantly.  Osteopathic findings  C2 flexed rotated and side bent right C5 flexed rotated and side bent left T3 extended rotated and side bent right inhaled rib T8 extended rotated and side bent left L2 flexed rotated and side bent right Sacrum right on right       Assessment and Plan:  Low testosterone in male Awaiting further evaluation with primary care provider as well as endocrinology.  DDD (degenerative disc disease), lumbosacral Severe arthritic changes that does respond relatively well to osteopathic manipulation.  Difficult at this time with patient's hip progressing significantly and getting a replacement next month.  Discussed with patient about icing regimen and home exercises, discussed which activities to do and which ones to avoid.  Increase activity slowly follow-up again in 6 to 8 weeks discussed which medications could be beneficial and given tramadol for breakthrough pain.  Chronic worsening pain.    Nonallopathic problems  Decision today to treat with OMT was based on Physical  Exam  After verbal consent patient was treated with HVLA, ME, FPR techniques in cervical, rib, thoracic, lumbar, and sacral  areas  Patient tolerated the procedure well with improvement in symptoms  Patient given exercises, stretches and lifestyle modifications  See medications in patient instructions if given  Patient will follow up in 4-8 weeks     The  above documentation has been reviewed and is accurate and complete Lyndal Pulley, DO         Note: This dictation was prepared with Dragon dictation along with smaller phrase technology. Any transcriptional errors that result from this process are unintentional.

## 2022-05-25 NOTE — Assessment & Plan Note (Signed)
Severe arthritic changes that does respond relatively well to osteopathic manipulation.  Difficult at this time with patient's hip progressing significantly and getting a replacement next month.  Discussed with patient about icing regimen and home exercises, discussed which activities to do and which ones to avoid.  Increase activity slowly follow-up again in 6 to 8 weeks discussed which medications could be beneficial and given tramadol for breakthrough pain.  Chronic worsening pain.

## 2022-05-25 NOTE — Patient Instructions (Signed)
Good to see you  Okay to take tramadol with the tylenol Keep [place] updated Follow up 6 weeks after surgery

## 2022-05-25 NOTE — Assessment & Plan Note (Signed)
Awaiting further evaluation with primary care provider as well as endocrinology.

## 2022-05-29 ENCOUNTER — Ambulatory Visit
Admission: RE | Admit: 2022-05-29 | Discharge: 2022-05-29 | Disposition: A | Discharge status: Home / Self Care | Payer: Medicare Other | Source: Ambulatory Visit | Attending: Urology | Admitting: Urology

## 2022-05-29 DIAGNOSIS — E291 Testicular hypofunction: Secondary | ICD-10-CM

## 2022-05-30 MED ORDER — GADOPICLENOL 0.5 MMOL/ML IV SOLN
10.0000 mL | Freq: Once | INTRAVENOUS | Status: AC | PRN
  Administered 2022-05-30: 10 mL via INTRAVENOUS

## 2022-06-06 ENCOUNTER — Other Ambulatory Visit: Payer: Self-pay

## 2022-06-15 ENCOUNTER — Other Ambulatory Visit: Payer: Self-pay | Admitting: Family Medicine

## 2022-06-15 ENCOUNTER — Telehealth: Payer: Self-pay | Admitting: Family Medicine

## 2022-06-15 MED ORDER — AMITRIPTYLINE HCL 25 MG PO TABS: 25.0000 mg | ORAL_TABLET | Freq: Every evening | ORAL | 3 refills | Status: DC | PRN

## 2022-06-15 MED ORDER — TRAMADOL HCL 50 MG PO TABS: 50.0000 mg | ORAL_TABLET | Freq: Four times a day (QID) | ORAL | 0 refills | Status: DC | PRN

## 2022-06-15 NOTE — Telephone Encounter (Signed)
Spoke with patient. Rx filled.

## 2022-06-15 NOTE — Telephone Encounter (Signed)
Pt needs refill of Tramadol and Amitiptyline to [facility]. He has notified them.  FYI- hip surgery moved up to 2/8, we have adjusted his follow up appt here.

## 2022-06-15 NOTE — Telephone Encounter (Signed)
done

## 2022-06-16 MED ORDER — AMITRIPTYLINE HCL 25 MG PO TABS: 25.0000 mg | ORAL_TABLET | Freq: Every evening | ORAL | 3 refills | Status: DC | PRN

## 2022-06-16 MED ORDER — TRAMADOL HCL 50 MG PO TABS: 50.0000 mg | ORAL_TABLET | Freq: Four times a day (QID) | ORAL | 0 refills | Status: DC | PRN

## 2022-06-16 NOTE — Telephone Encounter (Signed)
Tramadol rx

## 2022-06-16 NOTE — Addendum Note (Signed)
Addended by: Lyndal Pulley on: 06/16/2022 04:27 PM   Modules accepted: Orders

## 2022-06-16 NOTE — Telephone Encounter (Signed)
Can this be resent to [facility] please, it was sent to an old pharmacy?

## 2022-06-17 ENCOUNTER — Telehealth: Payer: Self-pay | Admitting: *Deleted

## 2022-06-17 NOTE — Telephone Encounter (Signed)
Ortho bundle pre-op call completed. 

## 2022-06-17 NOTE — Care Plan (Signed)
[facility] RNCM call to patient to discuss his upcoming Right total hip arthroplasty with Dr. Ninfa Linden on 06/30/22. He is an Ortho bundle patient through [facility] and is agreeable to case management. He lives alone and has inquired about STR as well as going home with assistance from some neighbors. We will see how he does after surgery. He has a rollator, RW, shower seat, and cane. No other DME is needed. Anticipate HHPT will be needed if he does not go to a STR after discharge. Choice provided and referral made to [facility]. Reviewed all post op care instructions. Will continue to follow for needs.

## 2022-06-21 NOTE — Progress Notes (Signed)
Surgical Instructions    Your procedure is scheduled on Thursday February 8.  Report to [facility] Main Entrance "A" at 5:30 A.M., then check in with the Admitting office.  Call this number if you have problems the morning of surgery:  [phone]   If you have any questions prior to your surgery date call [phone]: Open Monday-Friday 8am-4pm If you experience any cold or flu symptoms such as cough, fever, chills, shortness of breath, etc. between now and your scheduled surgery, please notify us at the above number     Remember:  Do not eat after midnight the night before your surgery  You may drink clear liquids until 4:30am the morning of your surgery.   Clear liquids allowed are: Water, Non-Citrus Juices (without pulp), Carbonated Beverages, Clear Tea, Black Coffee ONLY (NO MILK, CREAM OR POWDERED CREAMER of any kind), and Gatorade    Take these medicines the morning of surgery with A SIP OF WATER:  doxycycline (MONODOX)  ezetimibe (ZETIA)  methocarbamol (ROBAXIN) if needed traMADol (ULTRAM) if needed  As of today, STOP taking any Aspirin (unless otherwise instructed by your surgeon) Aleve, Naproxen, Ibuprofen, Motrin, Advil, Goody's, BC's, all herbal medications, fish oil, and all vitamins.           Do not wear jewelry or makeup. Do not wear lotions, powders, perfumes/cologne or deodorant. Do not shave 48 hours prior to surgery.  Men may shave face and neck. Do not bring valuables to the hospital. Do not wear nail polish, gel polish, artificial nails, or any other type of covering on natural nails (fingers and toes) If you have artificial nails or gel coating that need to be removed by a nail salon, please have this removed prior to surgery. Artificial nails or gel coating may interfere with anesthesia's ability to adequately monitor your vital signs.  [facility] is not responsible for any belongings or valuables.    Do NOT Smoke (Tobacco/Vaping)  24 hours prior to your  procedure  If you use a CPAP at night, you may bring your mask for your overnight stay.   Contacts, glasses, hearing aids, dentures or partials may not be worn into surgery, please bring cases for these belongings   For patients admitted to the hospital, discharge time will be determined by your treatment team.   Patients discharged the day of surgery will not be allowed to drive home, and someone needs to stay with them for 24 hours.   SURGICAL WAITING ROOM VISITATION Patients having surgery or a procedure may have no more than 2 support people in the waiting area - these visitors may rotate.   Children under the age of 36 must have an adult with them who is not the patient. If the patient needs to stay at the hospital during part of their recovery, the visitor guidelines for inpatient rooms apply. Pre-op nurse will coordinate an appropriate time for 1 support person to accompany patient in pre-op.  This support person may not rotate.   Please refer to [email or web address] for the visitor guidelines for Inpatients (after your surgery is over and you are in a regular room).    Special instructions:    Oral Hygiene is also important to reduce your risk of infection.  Remember - BRUSH YOUR TEETH THE MORNING OF SURGERY WITH YOUR REGULAR TOOTHPASTE   [facility]- Preparing For Surgery  Before surgery, you can play an important role. Because skin is not sterile, your skin needs to be as free of  germs as possible. You can reduce the number of germs on your skin by washing with CHG (chlorahexidine gluconate) Soap before surgery.  CHG is an antiseptic cleaner which kills germs and bonds with the skin to continue killing germs even after washing.     Please do not use if you have an allergy to CHG or antibacterial soaps. If your skin becomes reddened/irritated stop using the CHG.  Do not shave (including legs and underarms) for at least 48 hours  prior to first CHG shower. It is OK to shave your face.  Please follow these instructions carefully.     Shower the NIGHT BEFORE SURGERY and the MORNING OF SURGERY with CHG Soap.   If you chose to wash your hair, wash your hair first as usual with your normal shampoo. After you shampoo, rinse your hair and body thoroughly to remove the shampoo.  Then [employer] and genitals (private parts) with your normal soap and rinse thoroughly to remove soap.  After that Use CHG Soap as you would any other liquid soap. You can apply CHG directly to the skin and wash gently with a scrungie or a clean washcloth.   Apply the CHG Soap to your body ONLY FROM THE NECK DOWN.  Do not use on open wounds or open sores. Avoid contact with your eyes, ears, mouth and genitals (private parts). Wash Face and genitals (private parts)  with your normal soap.   Wash thoroughly, paying special attention to the area where your surgery will be performed.  Thoroughly rinse your body with warm water from the neck down.  DO NOT shower/wash with your normal soap after using and rinsing off the CHG Soap.  Pat yourself dry with a CLEAN TOWEL.  Wear CLEAN PAJAMAS to bed the night before surgery  Place CLEAN SHEETS on your bed the night before your surgery  DO NOT SLEEP WITH PETS.   Day of Surgery:  Take a shower with CHG soap. Wear Clean/Comfortable clothing the morning of surgery Do not apply any deodorants/lotions.   Remember to brush your teeth WITH YOUR REGULAR TOOTHPASTE.    If you received a COVID test during your pre-op visit, it is requested that you wear a mask when out in public, stay away from anyone that may not be feeling well, and notify your surgeon if you develop symptoms. If you have been in contact with anyone that has tested positive in the last 10 days, please notify your surgeon.    Please read over the following fact sheets that you were given.

## 2022-06-22 ENCOUNTER — Encounter (HOSPITAL_COMMUNITY): Payer: Self-pay

## 2022-06-22 ENCOUNTER — Other Ambulatory Visit: Payer: Self-pay

## 2022-06-22 ENCOUNTER — Encounter (HOSPITAL_COMMUNITY)
Admission: RE | Admit: 2022-06-22 | Discharge: 2022-06-22 | Disposition: A | Discharge status: Home / Self Care | Payer: Medicare Other | Source: Ambulatory Visit | Attending: Orthopaedic Surgery | Admitting: Orthopaedic Surgery

## 2022-06-22 VITALS — BP 136/83 | HR 84 | Temp 98.3°F | Resp 18 | Ht 67.0 in | Wt 212.3 lb

## 2022-06-22 DIAGNOSIS — Z01818 Encounter for other preprocedural examination: Secondary | ICD-10-CM | POA: Insufficient documentation

## 2022-06-22 DIAGNOSIS — I480 Paroxysmal atrial fibrillation: Secondary | ICD-10-CM | POA: Diagnosis not present

## 2022-06-22 LAB — BASIC METABOLIC PANEL
Anion gap: 9 (ref 5–15)
BUN: 6 mg/dL — ABNORMAL LOW (ref 8–23)
CO2: 29 mmol/L (ref 22–32)
Calcium: 8.9 mg/dL (ref 8.9–10.3)
Chloride: 102 mmol/L (ref 98–111)
Creatinine, Ser: 0.94 mg/dL (ref 0.61–1.24)
GFR, Estimated: >60 mL/min (ref >60)
Glucose, Bld: 103 mg/dL — ABNORMAL HIGH (ref 70–99)
Potassium: 3.5 mmol/L (ref 3.5–5.1)
Sodium: 140 mmol/L (ref 135–145)

## 2022-06-22 LAB — CBC
HCT: 45.2 % (ref 39.0–52.0)
Hemoglobin: 14.6 g/dL (ref 13.0–17.0)
MCH: 30.5 pg (ref 26.0–34.0)
MCHC: 32.3 g/dL (ref 30.0–36.0)
MCV: 94.6 fL (ref 80.0–100.0)
Platelets: 270 10*3/uL (ref 150–400)
RBC: 4.78 MIL/uL (ref 4.22–5.81)
RDW: 13.2 % (ref 11.5–15.5)
WBC: 6.1 10*3/uL (ref 4.0–10.5)
nRBC: 0 % (ref 0.0–0.2)

## 2022-06-22 LAB — TYPE AND SCREEN
ABO/RH(D): O POS
Antibody Screen: NEGATIVE

## 2022-06-22 LAB — SURGICAL PCR SCREEN
MRSA, PCR: NEGATIVE
Staphylococcus aureus: NEGATIVE

## 2022-06-22 NOTE — Progress Notes (Signed)
Surgical Instructions    Your procedure is scheduled on Thursday June 30, 2022.  Report to [facility] Main Entrance "A" at 5:30 A.M., then check in with the Admitting office.  Call this number if you have problems the morning of surgery:  [phone]   If you have any questions prior to your surgery date call [phone]: Open Monday-Friday 8am-4pm If you experience any cold or flu symptoms such as cough, fever, chills, shortness of breath, etc. between now and your scheduled surgery, please notify us at the above number     Remember:  Do not eat after midnight the night before your surgery  You may drink clear liquids until 4:30am the morning of your surgery.   Clear liquids allowed are: Water, Non-Citrus Juices (without pulp), Carbonated Beverages, Clear Tea, Black Coffee ONLY (NO MILK, CREAM OR POWDERED CREAMER of any kind), and Gatorade   Enhanced Recovery after Surgery for Orthopedics Enhanced Recovery after Surgery is a protocol used to improve the stress on your body and your recovery after surgery.  Patient Instructions  The day of surgery (if you do NOT have diabetes):  Drink ONE (1) Pre-Surgery Clear Ensure by  4:30 am the morning of surgery   This drink was given to you during your hospital  pre-op appointment visit. Nothing else to drink after completing the  Pre-Surgery Clear Ensure.          If you have questions, please contact your surgeon's office.     Take these medicines the morning of surgery with A SIP OF WATER:  doxycycline (MONODOX)  ezetimibe (ZETIA)  methocarbamol (ROBAXIN) if needed traMADol (ULTRAM) if needed  As of today, STOP taking any Aspirin (unless otherwise instructed by your surgeon) Aleve, Naproxen, Ibuprofen, Motrin, Advil, Goody's, BC's, all herbal medications, fish oil, and all vitamins.           Do not wear jewelry. Do not wear lotions, powders, cologne or deodorant. Do not shave 48 hours prior to surgery.  Men may shave face  and neck. Do not bring valuables to the hospital. Do not wear nail polish, gel polish, artificial nails, or any other type of covering on natural nails (fingers and toes)  [facility] is not responsible for any belongings or valuables.    Do NOT Smoke (Tobacco/Vaping)  24 hours prior to your procedure  If you use a CPAP at night, you may bring your mask for your overnight stay.   Contacts, glasses, hearing aids, dentures or partials may not be worn into surgery, please bring cases for these belongings   For patients admitted to the hospital, discharge time will be determined by your treatment team.   Patients discharged the day of surgery will not be allowed to drive home, and someone needs to stay with them for 24 hours.   SURGICAL WAITING ROOM VISITATION Patients having surgery or a procedure may have no more than 2 support people in the waiting area - these visitors may rotate.   Children under the age of 81 must have an adult with them who is not the patient. If the patient needs to stay at the hospital during part of their recovery, the visitor guidelines for inpatient rooms apply. Pre-op nurse will coordinate an appropriate time for 1 support person to accompany patient in pre-op.  This support person may not rotate.   Please refer to [email or web address] for the visitor guidelines for Inpatients (after your surgery is over and you are in a regular room).  Special instructions:    Oral Hygiene is also important to reduce your risk of infection.  Remember - BRUSH YOUR TEETH THE MORNING OF SURGERY WITH YOUR REGULAR TOOTHPASTE   [facility]- Preparing For Surgery  Before surgery, you can play an important role. Because skin is not sterile, your skin needs to be as free of germs as possible. You can reduce the number of germs on your skin by washing with CHG (chlorahexidine gluconate) Soap before surgery.  CHG is an antiseptic  cleaner which kills germs and bonds with the skin to continue killing germs even after washing.     Please do not use if you have an allergy to CHG or antibacterial soaps. If your skin becomes reddened/irritated stop using the CHG.  Do not shave (including legs and underarms) for at least 48 hours prior to first CHG shower. It is OK to shave your face.  Please follow these instructions carefully.     Shower the NIGHT BEFORE SURGERY and the MORNING OF SURGERY with CHG Soap.   If you chose to wash your hair, wash your hair first as usual with your normal shampoo. After you shampoo, rinse your hair and body thoroughly to remove the shampoo.  Then [employer] and genitals (private parts) with your normal soap and rinse thoroughly to remove soap.  After that Use CHG Soap as you would any other liquid soap. You can apply CHG directly to the skin and wash gently with a scrungie or a clean washcloth.   Apply the CHG Soap to your body ONLY FROM THE NECK DOWN.  Do not use on open wounds or open sores. Avoid contact with your eyes, ears, mouth and genitals (private parts). Wash Face and genitals (private parts)  with your normal soap.   Wash thoroughly, paying special attention to the area where your surgery will be performed.  Thoroughly rinse your body with warm water from the neck down.  DO NOT shower/wash with your normal soap after using and rinsing off the CHG Soap.  Pat yourself dry with a CLEAN TOWEL.  Wear CLEAN PAJAMAS to bed the night before surgery  Place CLEAN SHEETS on your bed the night before your surgery  DO NOT SLEEP WITH PETS.   Day of Surgery:  Take a shower with CHG soap. Wear Clean/Comfortable clothing the morning of surgery Do not apply any deodorants/lotions.   Remember to brush your teeth WITH YOUR REGULAR TOOTHPASTE.    If you received a COVID test during your pre-op visit, it is requested that you wear a mask when out in public, stay away from anyone that may not  be feeling well, and notify your surgeon if you develop symptoms. If you have been in contact with anyone that has tested positive in the last 10 days, please notify your surgeon.    Please read over the following fact sheets that you were given.

## 2022-06-22 NOTE — Progress Notes (Signed)
PCP - Dr.David Bouska Cardiologist - Pt denies. Seen cardiologist in [facility] for Atrial Fib in 2018 only.  PPM/ICD - pt denies Device Orders - n/a Rep Notified - n/a  Chest x-ray - n/a EKG - 06/22/22 Stress Test - 10/30/16 ECHO - 10/30/16 Cardiac Cath - pt denies  Sleep Study - pt denies CPAP - n/a  Fasting Blood Sugar - pt denies Diabetes  Checks Blood Sugar _____ times a day  Last dose of GLP1 agonist-  pt denies GLP1 instructions: n/a  Blood Thinner Instructions:pt denies Aspirin Instructions:pt denies  ERAS Protcol -yes PRE-SURGERY Ensure given at PAT  COVID TEST- n/a   Anesthesia review: YES, heart history of Atrial Fib in 2018 seen at [facility] per patient.   Patient denies shortness of breath, fever, cough and chest pain at PAT appointment   All instructions explained to the patient, with a verbal understanding of the material. Patient agrees to go over the instructions while at home for a better understanding. Patient also instructed to self quarantine after being tested for COVID-19. The opportunity to ask questions was provided.

## 2022-06-23 NOTE — Progress Notes (Signed)
Anesthesia Chart Review:  Case: [id number] Date/Time: 06/30/22 0715   Procedure: RIGHT TOTAL HIP ARTHROPLASTY ANTERIOR APPROACH (Right: Hip)   Anesthesia type: Spinal   Pre-op diagnosis: osteoarthritis right hip   Location: [facility] OR ROOM 06 / [place] OR   Surgeons: Mcarthur Rossetti, MD       DISCUSSION: Patient is a 72 year old male scheduled for the above procedure.  History includes never smoker, afib (single episode 10/30/16), anxiety, diverticulosis, hernia repair, transanal excision of dysplastic lesion (04/29/22, + squamous papillomatous lesion with HSIL, no invasive carcinoma). BMI is consistent with obesity.   He is not followed currently by cardiology, but was evaluated by Dr. Cristopher Peru on 10/30/16 after transfer from [facility] with chest pain, ruled out ACS. Nuclear stress test was non-ischemic. He developed afib at 116 bpm prior to discharge and converted to SR with IV diltiazem. He did not have any recurrence. TSH was normal. CHADVASC score was 0, so anticoagulation therapy was not recommended. He was started on low dose metoprolol. CTA negative for PE. Echo showed mild LVH, LVEF 60-65%, no regional wall motion abnormality, trivial PR/TR, lipomatous hypertrophy of the atrial septum, no PFO identified. He had out-patient follow-up with Dr. Lovena Le on 11/11/16. Previous chest pain thought secondary to esophageal spasm or reflux and PPI recommended. HTN controlled, so low dose b-blocker discontinued. Weight loss and low salt diet encouraged. If palpitations worsen, he can restart b-blocker. As needed cardiology follow-up recommended.   No known recurrence of afib since 2018. 06/22/22 EKG showed NSR. He denied chest pain and SOB per PAT RN interview. Preoperative labs acceptable for OR.   Anesthesia team to evaluate on the day of surgery.   VS: BP 136/83   Pulse 84   Temp 36.8 C (Oral)   Resp 18   Ht '5\' 7"'$  (1.702 m)   Wt 96.3 kg   SpO2 99%   BMI 33.25 kg/m    PROVIDERS: Bernerd Limbo, MD is PCP  Cristopher Peru, MD is EP cardiologist. As needed follow-up at 11/11/16 visit.    LABS: Labs reviewed: Acceptable for surgery. (all labs ordered are listed, but only abnormal results are displayed)  Labs Reviewed  BASIC METABOLIC PANEL - Abnormal; Notable for the following components:      Result Value   Glucose, Bld 103 (*)    BUN 6 (*)    All other components within normal limits  SURGICAL PCR SCREEN  CBC  TYPE AND SCREEN    IMAGES: MRI Brain 05/30/22: IMPRESSION: No evidence of acute intracranial abnormality. No convincing pituitary lesion.  MRI L-spine 03/13/22: IMPRESSION: 1. Stable MRI appearance of the Lumbar spine since last year except for increased leftward disc and mild to moderate left lateral recess stenosis at L3-L4. 2. No new or increased right side neural impingement. Chronic moderate right L4, mild right L2 and right L5 neural foraminal stenosis. 3. Note transitional lumbosacral anatomy with partially lumbarized S1 level as on prior exams. Correlation with radiographs is recommended prior to any operative intervention.  MRI pelvis 03/13/22: IMPRESSION: 1. Mild to moderate right and mild left hip osteoarthritis.    EKG: 06/22/12: Normal sinus rhythm Normal ECG When compared with ECG of 30-Oct-2016 13:54,sinus rhythm is now present Confirmed by Cristopher Peru [phone]) on 06/22/2022 3:26:21 PM   CV: Echo 10/30/16: Study Conclusions  - Left ventricle: The cavity size was normal. Wall thickness was    increased in a pattern of mild LVH. Systolic function was normal.  The estimated ejection fraction was in the range of 60% to 65%.    Wall motion was normal; there were no regional wall motion    abnormalities.  - Atrial septum: There was increased thickness of the septum,    consistent with lipomatous hypertrophy. No defect or patent    foramen ovale was identified.    Nuclear stress test 10/30/16: The study is  normal. This is a low risk study. The left ventricular ejection fraction is normal (55-65%).  Normal resting and stress perfusion. No ischemia or infarction EF EF 61%     Past Medical History:  Diagnosis Date   Acne    Anxiety    DDD (degenerative disc disease), lumbosacral    chronic back pain   Diverticulosis of colon (without mention of hemorrhage)    colo 09/2010   History of atrial fibrillation 10/30/2016   Low back pain radiating to both legs     Past Surgical History:  Procedure Laterality Date   CATARACT EXTRACTION Left 07/14/2015   COLONOSCOPY     CYST EXCISION Left 06/23/2015   Procedure: EXCISION MASS LEFT INDEX FINGER;  Surgeon: Daryll Brod, MD;  Location: [place];  Service: Orthopedics;  Laterality: Left;  ANESTHESIA: IV REGIONAL/FAB   HERNIA REPAIR     REFRACTIVE SURGERY     left eye/ with enhancement   TONSILLECTOMY      MEDICATIONS:  acetaminophen (TYLENOL) 500 MG tablet   acetaminophen-codeine (TYLENOL #3) 300-30 MG tablet   amitriptyline (ELAVIL) 25 MG tablet   Cholecalciferol (VITAMIN D-1000 MAX ST) 25 MCG (1000 UT) tablet   cyanocobalamin (VITAMIN B12) 1000 MCG tablet   doxycycline (MONODOX) 50 MG capsule   ezetimibe (ZETIA) 10 MG tablet   Homeopathic Products (PROSACEA EX)   hydrOXYzine (ATARAX) 10 MG tablet   methocarbamol (ROBAXIN) 500 MG tablet   Multiple Vitamins-Minerals (MULTIVITAMIN PO)   predniSONE (DELTASONE) 50 MG tablet   Testosterone 12.5 MG/ACT (1%) GEL   traMADol (ULTRAM) 50 MG tablet   No current facility-administered medications for this encounter.  Doxycycline is for rosacea. He is not currently taking prednisone.    Myra Gianotti, PA-C Surgical Short Stay/Anesthesiology [facility] Phone [phone] [facility] Phone [phone] 06/24/2022 9:43 AM

## 2022-06-24 NOTE — Anesthesia Preprocedure Evaluation (Signed)
Anesthesia Evaluation  Patient identified by MRN, date of birth, ID band Patient awake    Reviewed: Allergy & Precautions, NPO status , Patient's Chart, lab work & pertinent test results  History of Anesthesia Complications Negative for: history of anesthetic complications  Airway Mallampati: III  TM Distance: >3 FB Neck ROM: Full    Dental no notable dental hx.    Pulmonary neg pulmonary ROS   Pulmonary exam normal        Cardiovascular Normal cardiovascular exam+ dysrhythmias Atrial Fibrillation      Neuro/Psych  Headaches  Anxiety        GI/Hepatic negative GI ROS, Neg liver ROS,,,  Endo/Other  negative endocrine ROS    Renal/GU negative Renal ROS  negative genitourinary   Musculoskeletal  (+) Arthritis ,    Abdominal   Peds  Hematology negative hematology ROS (+)   Anesthesia Other Findings Day of surgery medications reviewed with patient.  Reproductive/Obstetrics negative OB ROS                              Anesthesia Physical Anesthesia Plan  ASA: 2  Anesthesia Plan: Spinal   Post-op Pain Management: Tylenol PO (pre-op)*   Induction:   PONV Risk Score and Plan: 2 and Treatment may vary due to age or medical condition, Ondansetron, Propofol infusion, Dexamethasone and Midazolam  Airway Management Planned: Natural Airway and Simple Face Mask  Additional Equipment: None  Intra-op Plan:   Post-operative Plan:   Informed Consent: I have reviewed the patients History and Physical, chart, labs and discussed the procedure including the risks, benefits and alternatives for the proposed anesthesia with the patient or authorized representative who has indicated his/her understanding and acceptance.       Plan Discussed with: CRNA  Anesthesia Plan Comments: (PAT note written 06/24/2022 by Myra Gianotti, PA-C.  )        Anesthesia Quick Evaluation

## 2022-06-29 ENCOUNTER — Telehealth: Payer: Self-pay | Admitting: *Deleted

## 2022-06-29 NOTE — Telephone Encounter (Signed)
Spoke with patient again for 2nd pre-op call. Feels he is very prepared. Has all his DME (RW, rolator, BSC/3in1). Has no one at home that will be staying with him, but really wants to go home. Does have neighbors that can bring food/go to grocery, etc. I told him to have a back up plan and he has chosen [facility] or [facility] for rehab if therapy feels he will need to go to SNF for STR.

## 2022-06-30 ENCOUNTER — Other Ambulatory Visit: Payer: Self-pay

## 2022-06-30 ENCOUNTER — Observation Stay (HOSPITAL_COMMUNITY): Payer: Medicare Other

## 2022-06-30 ENCOUNTER — Encounter (HOSPITAL_COMMUNITY): Payer: Self-pay | Admitting: Orthopaedic Surgery

## 2022-06-30 ENCOUNTER — Ambulatory Visit (HOSPITAL_COMMUNITY): Payer: Medicare Other | Admitting: Vascular Surgery

## 2022-06-30 ENCOUNTER — Ambulatory Visit (HOSPITAL_COMMUNITY): Payer: Medicare Other

## 2022-06-30 ENCOUNTER — Encounter (HOSPITAL_COMMUNITY)
Admission: RE | Disposition: A | Discharge status: Skilled Nursing Facility | Payer: Self-pay | Source: Home / Self Care | Attending: Orthopaedic Surgery

## 2022-06-30 ENCOUNTER — Inpatient Hospital Stay (HOSPITAL_COMMUNITY)
Admission: RE | Admit: 2022-06-30 | Discharge: 2022-07-04 | DRG: 470 | Disposition: A | Discharge status: Skilled Nursing Facility | Payer: Medicare Other | Attending: Orthopaedic Surgery | Admitting: Orthopaedic Surgery

## 2022-06-30 ENCOUNTER — Ambulatory Visit (HOSPITAL_BASED_OUTPATIENT_CLINIC_OR_DEPARTMENT_OTHER): Payer: Medicare Other | Admitting: Anesthesiology

## 2022-06-30 DIAGNOSIS — M1611 Unilateral primary osteoarthritis, right hip: Principal | ICD-10-CM | POA: Diagnosis present

## 2022-06-30 DIAGNOSIS — M25551 Pain in right hip: Secondary | ICD-10-CM | POA: Diagnosis present

## 2022-06-30 DIAGNOSIS — Z8249 Family history of ischemic heart disease and other diseases of the circulatory system: Secondary | ICD-10-CM

## 2022-06-30 DIAGNOSIS — Z833 Family history of diabetes mellitus: Secondary | ICD-10-CM

## 2022-06-30 DIAGNOSIS — Z8261 Family history of arthritis: Secondary | ICD-10-CM

## 2022-06-30 DIAGNOSIS — F419 Anxiety disorder, unspecified: Secondary | ICD-10-CM | POA: Diagnosis present

## 2022-06-30 DIAGNOSIS — Z96641 Presence of right artificial hip joint: Secondary | ICD-10-CM

## 2022-06-30 DIAGNOSIS — Z751 Person awaiting admission to adequate facility elsewhere: Secondary | ICD-10-CM

## 2022-06-30 DIAGNOSIS — I2510 Atherosclerotic heart disease of native coronary artery without angina pectoris: Secondary | ICD-10-CM | POA: Diagnosis present

## 2022-06-30 DIAGNOSIS — M25751 Osteophyte, right hip: Secondary | ICD-10-CM | POA: Diagnosis present

## 2022-06-30 DIAGNOSIS — I480 Paroxysmal atrial fibrillation: Secondary | ICD-10-CM | POA: Diagnosis present

## 2022-06-30 DIAGNOSIS — Z888 Allergy status to other drugs, medicaments and biological substances status: Secondary | ICD-10-CM

## 2022-06-30 HISTORY — PX: TOTAL HIP ARTHROPLASTY: SHX124

## 2022-06-30 LAB — ABO/RH: ABO/RH(D): O POS

## 2022-06-30 SURGERY — ARTHROPLASTY, HIP, TOTAL, ANTERIOR APPROACH
Anesthesia: Spinal | Site: Hip | Laterality: Right

## 2022-06-30 MED ORDER — PHENOL 1.4 % MT LIQD: 1.0000 | OROMUCOSAL | Status: DC | PRN

## 2022-06-30 MED ORDER — BUPIVACAINE IN DEXTROSE 0.75-8.25 % IT SOLN
INTRATHECAL | Status: DC | PRN
  Administered 2022-06-30: 1.6 mL via INTRATHECAL

## 2022-06-30 MED ORDER — HYDROMORPHONE HCL 1 MG/ML IJ SOLN
0.5000 mg | INTRAMUSCULAR | Status: DC | PRN
  Filled 2022-06-30: qty 1

## 2022-06-30 MED ORDER — DROPERIDOL 2.5 MG/ML IJ SOLN: 0.6250 mg | Freq: Once | INTRAMUSCULAR | Status: DC | PRN

## 2022-06-30 MED ORDER — SODIUM CHLORIDE 0.9 % IR SOLN
Status: DC | PRN
  Administered 2022-06-30: 1000 mL

## 2022-06-30 MED ORDER — SODIUM CHLORIDE 0.9 % IV SOLN: INTRAVENOUS | Status: DC

## 2022-06-30 MED ORDER — TRANEXAMIC ACID-NACL 1000-0.7 MG/100ML-% IV SOLN
INTRAVENOUS | Status: DC | PRN
  Administered 2022-06-30: 1000 mg via INTRAVENOUS

## 2022-06-30 MED ORDER — METHOCARBAMOL 1000 MG/10ML IJ SOLN: 500.0000 mg | Freq: Four times a day (QID) | INTRAVENOUS | Status: DC | PRN

## 2022-06-30 MED ORDER — DOCUSATE SODIUM 100 MG PO CAPS
100.0000 mg | ORAL_CAPSULE | Freq: Two times a day (BID) | ORAL | Status: DC
  Administered 2022-06-30 – 2022-07-04 (×9): 100 mg via ORAL
  Filled 2022-06-30 (×9): qty 1

## 2022-06-30 MED ORDER — AMITRIPTYLINE HCL 25 MG PO TABS
25.0000 mg | ORAL_TABLET | Freq: Every day | ORAL | Status: DC
  Administered 2022-06-30 – 2022-07-03 (×4): 25 mg via ORAL
  Filled 2022-06-30 (×4): qty 1

## 2022-06-30 MED ORDER — ASPIRIN 81 MG PO CHEW
81.0000 mg | CHEWABLE_TABLET | Freq: Two times a day (BID) | ORAL | Status: DC
  Administered 2022-06-30 – 2022-07-04 (×8): 81 mg via ORAL
  Filled 2022-06-30 (×8): qty 1

## 2022-06-30 MED ORDER — FENTANYL CITRATE (PF) 100 MCG/2ML IJ SOLN
25.0000 ug | INTRAMUSCULAR | Status: DC | PRN
  Administered 2022-06-30 (×2): 50 ug via INTRAVENOUS

## 2022-06-30 MED ORDER — OXYCODONE HCL 5 MG/5ML PO SOLN: 5.0000 mg | Freq: Once | ORAL | Status: DC | PRN

## 2022-06-30 MED ORDER — CHLORHEXIDINE GLUCONATE 0.12 % MT SOLN
15.0000 mL | Freq: Once | OROMUCOSAL | Status: AC
  Administered 2022-06-30: 15 mL via OROMUCOSAL
  Filled 2022-06-30: qty 15

## 2022-06-30 MED ORDER — 0.9 % SODIUM CHLORIDE (POUR BTL) OPTIME
TOPICAL | Status: DC | PRN
  Administered 2022-06-30: 1000 mL

## 2022-06-30 MED ORDER — METOCLOPRAMIDE HCL 5 MG PO TABS: 5.0000 mg | ORAL_TABLET | Freq: Three times a day (TID) | ORAL | Status: DC | PRN

## 2022-06-30 MED ORDER — DIPHENHYDRAMINE HCL 12.5 MG/5ML PO ELIX: 12.5000 mg | ORAL_SOLUTION | ORAL | Status: DC | PRN

## 2022-06-30 MED ORDER — METOCLOPRAMIDE HCL 5 MG/ML IJ SOLN: 5.0000 mg | Freq: Three times a day (TID) | INTRAMUSCULAR | Status: DC | PRN

## 2022-06-30 MED ORDER — PANTOPRAZOLE SODIUM 40 MG PO TBEC
40.0000 mg | DELAYED_RELEASE_TABLET | Freq: Every day | ORAL | Status: DC
  Administered 2022-06-30 – 2022-07-04 (×4): 40 mg via ORAL
  Filled 2022-06-30 (×5): qty 1

## 2022-06-30 MED ORDER — FENTANYL CITRATE (PF) 250 MCG/5ML IJ SOLN
INTRAMUSCULAR | Status: AC
  Filled 2022-06-30: qty 5

## 2022-06-30 MED ORDER — ACETAMINOPHEN 325 MG PO TABS
325.0000 mg | ORAL_TABLET | Freq: Four times a day (QID) | ORAL | Status: DC | PRN
  Administered 2022-07-02 – 2022-07-03 (×3): 650 mg via ORAL
  Filled 2022-06-30 (×3): qty 2

## 2022-06-30 MED ORDER — ACETAMINOPHEN 500 MG PO TABS
1000.0000 mg | ORAL_TABLET | Freq: Once | ORAL | Status: DC
  Filled 2022-06-30: qty 2

## 2022-06-30 MED ORDER — DEXAMETHASONE SODIUM PHOSPHATE 10 MG/ML IJ SOLN
INTRAMUSCULAR | Status: DC | PRN
  Administered 2022-06-30: 10 mg via INTRAVENOUS

## 2022-06-30 MED ORDER — MIDAZOLAM HCL 2 MG/2ML IJ SOLN
INTRAMUSCULAR | Status: AC
  Filled 2022-06-30: qty 2

## 2022-06-30 MED ORDER — FENTANYL CITRATE (PF) 250 MCG/5ML IJ SOLN
INTRAMUSCULAR | Status: DC | PRN
  Administered 2022-06-30: 25 ug via INTRAVENOUS
  Administered 2022-06-30 (×2): 50 ug via INTRAVENOUS

## 2022-06-30 MED ORDER — EZETIMIBE 10 MG PO TABS
10.0000 mg | ORAL_TABLET | Freq: Every day | ORAL | Status: DC
  Administered 2022-07-01 – 2022-07-04 (×4): 10 mg via ORAL
  Filled 2022-06-30 (×4): qty 1

## 2022-06-30 MED ORDER — OXYCODONE HCL 5 MG PO TABS: 5.0000 mg | ORAL_TABLET | Freq: Once | ORAL | Status: DC | PRN

## 2022-06-30 MED ORDER — LIDOCAINE 2% (20 MG/ML) 5 ML SYRINGE
INTRAMUSCULAR | Status: DC | PRN
  Administered 2022-06-30: 20 mg via INTRAVENOUS

## 2022-06-30 MED ORDER — ONDANSETRON HCL 4 MG PO TABS: 4.0000 mg | ORAL_TABLET | Freq: Four times a day (QID) | ORAL | Status: DC | PRN

## 2022-06-30 MED ORDER — CEFAZOLIN SODIUM-DEXTROSE 2-4 GM/100ML-% IV SOLN
2.0000 g | INTRAVENOUS | Status: AC
  Administered 2022-06-30: 2 g via INTRAVENOUS
  Filled 2022-06-30: qty 100

## 2022-06-30 MED ORDER — ONDANSETRON HCL 4 MG/2ML IJ SOLN: 4.0000 mg | Freq: Four times a day (QID) | INTRAMUSCULAR | Status: DC | PRN

## 2022-06-30 MED ORDER — ONDANSETRON HCL 4 MG/2ML IJ SOLN
INTRAMUSCULAR | Status: DC | PRN
  Administered 2022-06-30: 4 mg via INTRAVENOUS

## 2022-06-30 MED ORDER — ORAL CARE MOUTH RINSE: 15.0000 mL | Freq: Once | OROMUCOSAL | Status: AC

## 2022-06-30 MED ORDER — LACTATED RINGERS IV SOLN: INTRAVENOUS | Status: DC

## 2022-06-30 MED ORDER — DOXYCYCLINE HYCLATE 50 MG PO CAPS
50.0000 mg | ORAL_CAPSULE | Freq: Every day | ORAL | Status: DC
  Administered 2022-07-01 – 2022-07-04 (×4): 50 mg via ORAL
  Filled 2022-06-30 (×4): qty 1

## 2022-06-30 MED ORDER — ALUM & MAG HYDROXIDE-SIMETH 200-200-20 MG/5ML PO SUSP
30.0000 mL | ORAL | Status: DC | PRN
  Administered 2022-07-01: 30 mL via ORAL
  Filled 2022-06-30: qty 30

## 2022-06-30 MED ORDER — MIDAZOLAM HCL 2 MG/2ML IJ SOLN
INTRAMUSCULAR | Status: DC | PRN
  Administered 2022-06-30: 1 mg via INTRAVENOUS

## 2022-06-30 MED ORDER — TRANEXAMIC ACID-NACL 1000-0.7 MG/100ML-% IV SOLN
INTRAVENOUS | Status: AC
  Filled 2022-06-30: qty 100

## 2022-06-30 MED ORDER — FENTANYL CITRATE (PF) 100 MCG/2ML IJ SOLN
INTRAMUSCULAR | Status: AC
  Filled 2022-06-30: qty 2

## 2022-06-30 MED ORDER — OXYCODONE HCL 5 MG PO TABS
5.0000 mg | ORAL_TABLET | ORAL | Status: DC | PRN
  Administered 2022-06-30: 5 mg via ORAL
  Administered 2022-06-30 (×2): 10 mg via ORAL
  Administered 2022-07-01 (×2): 5 mg via ORAL
  Administered 2022-07-02 – 2022-07-03 (×2): 10 mg via ORAL
  Filled 2022-06-30: qty 1
  Filled 2022-06-30: qty 2
  Filled 2022-06-30: qty 1
  Filled 2022-06-30: qty 2
  Filled 2022-06-30 (×3): qty 1

## 2022-06-30 MED ORDER — PHENYLEPHRINE HCL-NACL 20-0.9 MG/250ML-% IV SOLN
INTRAVENOUS | Status: DC | PRN
  Administered 2022-06-30: 50 ug/min via INTRAVENOUS

## 2022-06-30 MED ORDER — VITAMIN D 25 MCG (1000 UNIT) PO TABS
1000.0000 [IU] | ORAL_TABLET | Freq: Every day | ORAL | Status: DC
  Administered 2022-06-30 – 2022-07-04 (×5): 1000 [IU] via ORAL
  Filled 2022-06-30 (×5): qty 1

## 2022-06-30 MED ORDER — ACETAMINOPHEN 10 MG/ML IV SOLN
INTRAVENOUS | Status: AC
  Filled 2022-06-30: qty 100

## 2022-06-30 MED ORDER — POVIDONE-IODINE 10 % EX SWAB: 2.0000 | Freq: Once | CUTANEOUS | Status: DC

## 2022-06-30 MED ORDER — METHOCARBAMOL 500 MG PO TABS
500.0000 mg | ORAL_TABLET | Freq: Four times a day (QID) | ORAL | Status: DC | PRN
  Administered 2022-06-30 – 2022-07-03 (×5): 500 mg via ORAL
  Filled 2022-06-30 (×4): qty 1

## 2022-06-30 MED ORDER — PROPOFOL 500 MG/50ML IV EMUL
INTRAVENOUS | Status: DC | PRN
  Administered 2022-06-30: 80 ug/kg/min via INTRAVENOUS

## 2022-06-30 MED ORDER — HYDROMORPHONE HCL 2 MG PO TABS
2.0000 mg | ORAL_TABLET | ORAL | Status: DC | PRN
  Administered 2022-06-30 – 2022-07-01 (×3): 2 mg via ORAL
  Administered 2022-07-01 (×2): 3 mg via ORAL
  Filled 2022-06-30: qty 2
  Filled 2022-06-30 (×2): qty 1
  Filled 2022-06-30: qty 2
  Filled 2022-06-30: qty 1

## 2022-06-30 MED ORDER — MENTHOL 3 MG MT LOZG: 1.0000 | LOZENGE | OROMUCOSAL | Status: DC | PRN

## 2022-06-30 MED ORDER — ACETAMINOPHEN 10 MG/ML IV SOLN
1000.0000 mg | Freq: Once | INTRAVENOUS | Status: AC
  Administered 2022-06-30: 1000 mg via INTRAVENOUS

## 2022-06-30 MED ORDER — OXYCODONE HCL 5 MG PO TABS
ORAL_TABLET | ORAL | Status: AC
  Filled 2022-06-30: qty 2

## 2022-06-30 MED ORDER — CEFAZOLIN SODIUM-DEXTROSE 1-4 GM/50ML-% IV SOLN
1.0000 g | Freq: Four times a day (QID) | INTRAVENOUS | Status: AC
  Administered 2022-06-30 (×2): 1 g via INTRAVENOUS
  Filled 2022-06-30 (×2): qty 50

## 2022-06-30 MED ORDER — METHOCARBAMOL 500 MG PO TABS
ORAL_TABLET | ORAL | Status: AC
  Filled 2022-06-30: qty 1

## 2022-06-30 SURGICAL SUPPLY — 53 items
ACETAB CUP W/GRIPTION 54 (Plate) ×1 IMPLANT
BAG COUNTER SPONGE SURGICOUNT (BAG) ×1 IMPLANT
BENZOIN TINCTURE PRP APPL 2/3 (GAUZE/BANDAGES/DRESSINGS) ×1 IMPLANT
BLADE CLIPPER SURG (BLADE) IMPLANT
BLADE SAW SGTL 18X1.27X75 (BLADE) ×1 IMPLANT
COVER SURGICAL LIGHT HANDLE (MISCELLANEOUS) ×1 IMPLANT
CUP ACETAB W/GRIPTION 54 (Plate) IMPLANT
DRAPE C-ARM 42X72 X-RAY (DRAPES) ×1 IMPLANT
DRAPE STERI IOBAN 125X83 (DRAPES) ×1 IMPLANT
DRAPE U-SHAPE 47X51 STRL (DRAPES) ×3 IMPLANT
DRSG AQUACEL AG ADV 3.5X10 (GAUZE/BANDAGES/DRESSINGS) ×1 IMPLANT
DURAPREP 26ML APPLICATOR (WOUND CARE) ×1 IMPLANT
ELECT BLADE 4.0 EZ CLEAN MEGAD (MISCELLANEOUS) ×1
ELECT BLADE 6.5 EXT (BLADE) IMPLANT
ELECT REM PT RETURN 9FT ADLT (ELECTROSURGICAL) ×1
ELECTRODE BLDE 4.0 EZ CLN MEGD (MISCELLANEOUS) ×1 IMPLANT
ELECTRODE REM PT RTRN 9FT ADLT (ELECTROSURGICAL) ×1 IMPLANT
FACESHIELD WRAPAROUND (MASK) ×2 IMPLANT
FACESHIELD WRAPAROUND OR TEAM (MASK) ×2 IMPLANT
GLOVE BIOGEL PI IND STRL 8 (GLOVE) ×2 IMPLANT
GLOVE ECLIPSE 8.0 STRL XLNG CF (GLOVE) ×1 IMPLANT
GLOVE ORTHO TXT STRL SZ7.5 (GLOVE) ×2 IMPLANT
GOWN STRL REUS W/ TWL LRG LVL3 (GOWN DISPOSABLE) ×2 IMPLANT
GOWN STRL REUS W/ TWL XL LVL3 (GOWN DISPOSABLE) ×2 IMPLANT
GOWN STRL REUS W/TWL LRG LVL3 (GOWN DISPOSABLE) ×2
GOWN STRL REUS W/TWL XL LVL3 (GOWN DISPOSABLE) ×2
HANDPIECE INTERPULSE COAX TIP (DISPOSABLE) ×1
HEAD CERAMIC DELTA 36 PLUS 1.5 (Hips) IMPLANT
KIT BASIN OR (CUSTOM PROCEDURE TRAY) ×1 IMPLANT
KIT TURNOVER KIT B (KITS) ×1 IMPLANT
LINER NEUTRAL 54X36MM PLUS 4 (Hips) IMPLANT
MANIFOLD NEPTUNE II (INSTRUMENTS) ×1 IMPLANT
NS IRRIG 1000ML POUR BTL (IV SOLUTION) ×1 IMPLANT
PACK TOTAL JOINT (CUSTOM PROCEDURE TRAY) ×1 IMPLANT
PAD ARMBOARD 7.5X6 YLW CONV (MISCELLANEOUS) ×1 IMPLANT
SET HNDPC FAN SPRY TIP SCT (DISPOSABLE) ×1 IMPLANT
STAPLER VISISTAT 35W (STAPLE) IMPLANT
STEM FEMORAL SZ 6MM STD ACTIS (Stem) IMPLANT
STRIP CLOSURE SKIN 1/2X4 (GAUZE/BANDAGES/DRESSINGS) ×2 IMPLANT
SUT ETHIBOND NAB CT1 #1 30IN (SUTURE) ×1 IMPLANT
SUT MNCRL AB 4-0 PS2 18 (SUTURE) IMPLANT
SUT VIC AB 0 CT1 27 (SUTURE) ×1
SUT VIC AB 0 CT1 27XBRD ANBCTR (SUTURE) ×1 IMPLANT
SUT VIC AB 1 CT1 27 (SUTURE) ×1
SUT VIC AB 1 CT1 27XBRD ANBCTR (SUTURE) ×1 IMPLANT
SUT VIC AB 2-0 CT1 27 (SUTURE) ×1
SUT VIC AB 2-0 CT1 TAPERPNT 27 (SUTURE) ×1 IMPLANT
TOWEL GREEN STERILE (TOWEL DISPOSABLE) ×1 IMPLANT
TOWEL GREEN STERILE FF (TOWEL DISPOSABLE) ×1 IMPLANT
TRAY CATH 16FR W/PLASTIC CATH (SET/KITS/TRAYS/PACK) IMPLANT
TRAY FOLEY W/BAG SLVR 16FR (SET/KITS/TRAYS/PACK)
TRAY FOLEY W/BAG SLVR 16FR ST (SET/KITS/TRAYS/PACK) IMPLANT
WATER STERILE IRR 1000ML POUR (IV SOLUTION) ×2 IMPLANT

## 2022-06-30 NOTE — Evaluation (Signed)
Physical Therapy Evaluation Patient Details Name: Daniel [name] MRN: [id number] DOB: 12-10-1950 Today's Date: 06/30/2022  History of Present Illness  Pt is a 72 y.o. male who presented 06/30/22 for direct anterior approach right THA. PMH: afib, anxiety  Clinical Impression  Pt presents with condition above and deficits mentioned below, see PT Problem List. PTA, he was independent and living alone in a 1-level house with a ramped entrance. He did report he would intermittent utilize a [facility] for longer community distance mobility, but it appeared to be more for pain management. Currently, pt is demonstrating deficits in R hip ROM, particularly in flexion, understandably following a THA. In addition, he is demonstrating deficits in balance and activity tolerance. He was able to perform transfers and ambulate slowly with a RW at a min guard assist level though with no LOB. He did require minA to manage his R leg for bed mobility, but was educated on various techniques to assist his R leg in bed mobility himself upon d/c home. Pt will likely progress well as his pain improves. Will continue to follow acutely.     Recommendations for follow up therapy are one component of a multi-disciplinary discharge planning process, led by the attending physician.  Recommendations may be updated based on patient status, additional functional criteria and insurance authorization.  Follow Up Recommendations Follow physician's recommendations for discharge plan and follow up therapies      Assistance Recommended at Discharge PRN  Patient can return home with the following  A little help with bathing/dressing/bathroom;Assistance with cooking/housework;Assist for transportation;Help with stairs or ramp for entrance    Equipment Recommendations None recommended by PT  Recommendations for Other Services  OT consult    Functional Status Assessment Patient has had a recent decline in their functional status and demonstrates  the ability to make significant improvements in function in a reasonable and predictable amount of time.     Precautions / Restrictions Precautions Precautions: Fall Restrictions Weight Bearing Restrictions: Yes RLE Weight Bearing: Weight bearing as tolerated Other Position/Activity Restrictions: no hip precautions orders      Mobility  Bed Mobility Overal bed mobility: Needs Assistance Bed Mobility: Supine to Sit     Supine to sit: Min assist, HOB elevated     General bed mobility comments: MinA to assist in sliding R leg towards L EOB while pt pulled on bed rails to manage trunk to sit up, HOB elevated. Extra time to complete. Demonstrated use of gait belt around leg and on pt hooking leg with other leg to assist it with bed mobility. Provided pt with gait belt.    Transfers Overall transfer level: Needs assistance Equipment used: Rolling walker (2 wheels) Transfers: Sit to/from Stand Sit to Stand: Min guard           General transfer comment: Cued pt for hand placement with transfers. Pt needing cues to slide R leg anteriorly to reduce R hip pain with transferring stand to sit to a lower surface. Min guard for safety    Ambulation/Gait Ambulation/Gait assistance: Min guard Gait Distance (Feet): 164 Feet Assistive device: Rolling walker (2 wheels) Gait Pattern/deviations: Step-to pattern, Step-through pattern, Decreased stance time - right, Decreased step length - left, Decreased stride length, Decreased weight shift to right, Antalgic, Trunk flexed Gait velocity: reduced Gait velocity interpretation: <1.8 ft/sec, indicate of risk for recurrent falls   General Gait Details: Pt initially with step-to antalgic gait pattern due to R hip pain, but as distance progressed his weight acceptance  and shift to his R leg improved and he was able to advance to a step-through gait pattern with improved upright posture and less reliance on RW. No LOB, min guard for  safety.  Stairs            Wheelchair Mobility    Modified Rankin (Stroke Patients Only)       Balance Overall balance assessment: Needs assistance Sitting-balance support: No upper extremity supported, Feet supported Sitting balance-Leahy Scale: Fair Sitting balance - Comments: Static sitting EOB with supervision for safety   Standing balance support: Bilateral upper extremity supported, During functional activity, Reliant on assistive device for balance Standing balance-Leahy Scale: Poor Standing balance comment: Reliant on RW                             Pertinent Vitals/Pain Pain Assessment Pain Assessment: Faces Faces Pain Scale: Hurts even more Pain Location: R hip Pain Descriptors / Indicators: Discomfort, Grimacing, Operative site guarding Pain Intervention(s): Limited activity within patient's tolerance, Monitored during session, Repositioned, Premedicated before session, RN gave pain meds during session, Ice applied    Home Living Family/patient expects to be discharged to:: Private residence Living Arrangements: Alone Available Help at Discharge: Family;Available PRN/intermittently (someone can check on him daily, but limited in amount of assistance that can be provided) Type of Home: House Home Access: Ramped entrance       Home Layout: One level Home Equipment: International aid/development worker (2 wheels);Rollator (4 wheels);Cane - single point      Prior Function Prior Level of Function : Independent/Modified Independent;Driving;Working/employed             Mobility Comments: Used SPC for longer community distance mobility, appeared to be more for pain management. Otherwise did not need AD. No falls. 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     Hand Dominance   Dominant Hand: Right    Extremity/Trunk Assessment   Upper Extremity Assessment Upper Extremity Assessment: Defer to OT  evaluation    Lower Extremity Assessment Lower Extremity Assessment: RLE deficits/detail RLE Deficits / Details: pain limiting hip AROM in all planes, WFL knee and ankle ROM    Cervical / Trunk Assessment Cervical / Trunk Assessment: Normal  Communication   Communication: No difficulties  Cognition Arousal/Alertness: Awake/alert Behavior During Therapy: WFL for tasks assessed/performed Overall Cognitive Status: Within Functional Limits for tasks assessed                                          General Comments General comments (skin integrity, edema, etc.): encouraged pt to perform AROM of lower extremity as able and to elevate and apply ice to reduce edema; VSS on RA    Exercises     Assessment/Plan    PT Assessment Patient needs continued PT services  PT Problem List Decreased strength;Decreased range of motion;Decreased activity tolerance;Decreased balance;Decreased mobility;Pain       PT Treatment Interventions DME instruction;Gait training;Functional mobility training;Therapeutic activities;Therapeutic exercise;Balance training;Neuromuscular re-education;Patient/family education;Stair training    PT Goals (Current goals can be found in the Care Plan section)  Acute Rehab PT Goals Patient Stated Goal: to be independent PT Goal Formulation: With patient Time For Goal Achievement: 07/14/22 Potential to Achieve Goals: Good    Frequency 7X/week     Co-evaluation  AM-PAC PT "6 Clicks" Mobility  Outcome Measure Help needed turning from your back to your side while in a flat bed without using bedrails?: A Little Help needed moving from lying on your back to sitting on the side of a flat bed without using bedrails?: A Little Help needed moving to and from a bed to a chair (including a wheelchair)?: A Little Help needed standing up from a chair using your arms (e.g., wheelchair or bedside chair)?: A Little Help needed to walk in  hospital room?: A Little Help needed climbing 3-5 steps with a railing? : A Lot 6 Click Score: 17    End of Session Equipment Utilized During Treatment: Gait belt Activity Tolerance: Patient tolerated treatment well Patient left: in chair;with call bell/phone within reach;with chair alarm set;with family/visitor present Nurse Communication: Mobility status PT Visit Diagnosis: Unsteadiness on feet (R26.81);Other abnormalities of gait and mobility (R26.89);Muscle weakness (generalized) (M62.81);Difficulty in walking, not elsewhere classified (R26.2);Pain Pain - Right/Left: Right Pain - part of body: Hip    Time: 2542-7062 PT Time Calculation (min) (ACUTE ONLY): 46 min   Charges:   PT Evaluation $PT Eval Low Complexity: 1 Low PT Treatments $Gait Training: 8-22 mins $Therapeutic Activity: 8-22 mins        Moishe Spice, PT, DPT Acute Rehabilitation Services  Office: [phone]   Orvan Falconer 06/30/2022, 4:21 PM

## 2022-06-30 NOTE — Anesthesia Postprocedure Evaluation (Signed)
Anesthesia Post Note  Patient: Daniel [name]  Procedure(s) Performed: RIGHT TOTAL HIP ARTHROPLASTY ANTERIOR APPROACH (Right: Hip)     Patient location during evaluation: PACU Anesthesia Type: Spinal Level of consciousness: awake and alert Pain management: pain level controlled Vital Signs Assessment: post-procedure vital signs reviewed and stable Respiratory status: spontaneous breathing, nonlabored ventilation and respiratory function stable Cardiovascular status: blood pressure returned to baseline Postop Assessment: no apparent nausea or vomiting, spinal receding, no headache and no backache Anesthetic complications: no   No notable events documented.  Last Vitals:  Vitals:   06/30/22 1015 06/30/22 1039  BP: 95/73 134/76  Pulse: 61 62  Resp: 15 17  Temp: 36.5 C (!) 36.4 C  SpO2: 95% 100%    Last Pain:  Vitals:   06/30/22 1039  TempSrc: Oral  PainSc: 2                  Marthenia Rolling

## 2022-06-30 NOTE — Transfer of Care (Signed)
Immediate Anesthesia Transfer of Care Note  Patient: Daniel [name]  Procedure(s) Performed: RIGHT TOTAL HIP ARTHROPLASTY ANTERIOR APPROACH (Right: Hip)  Patient Location: PACU  Anesthesia Type:MAC and Spinal  Level of Consciousness: awake, drowsy, patient cooperative, and responds to stimulation  Airway & Oxygen Therapy: Patient Spontanous Breathing and Patient connected to face mask oxygen  Post-op Assessment: Report given to RN and Post -op Vital signs reviewed and stable  Post vital signs: Reviewed and stable  Last Vitals:  Vitals Value Taken Time  BP    Temp    Pulse    Resp    SpO2      Last Pain:  Vitals:   06/30/22 0611  TempSrc:   PainSc: 2          Complications: No notable events documented.

## 2022-06-30 NOTE — TOC Initial Note (Signed)
Transition of Care [facility]) - Initial/Assessment Note    Patient Details  Name: Daniel [name] MRN: [id number] Date of Birth: 04-Oct-1950  [facility] CM/SW Contact:    Sharin Mons, RN Phone Number: 06/30/2022, 2:57 PM  Clinical Narrative:                  - s/p R total hip arthroplasty, 2/8   Consult received:PT / OT / SLP / DME as needed. Pt is an ortho bundle.[facility] services prearranged with [place] by provider's office. Pt without DME needs.Pt from home alone. PT evaluation pending....  TOC team following and will assist with needs.  Expected Discharge Plan: [place] (vs SNF) Barriers to Discharge: Continued Medical Work up   Patient Goals and CMS Choice            Expected Discharge Plan and Services                                              Prior Living Arrangements/Services                       Activities of Daily Living Home Assistive Devices/Equipment: Cane (specify quad or straight), Shower chair with back ADL Screening (condition at time of admission) Patient's cognitive ability adequate to safely complete daily activities?: Yes Is the patient deaf or have difficulty hearing?: No Does the patient have difficulty seeing, even when wearing glasses/contacts?: No Does the patient have difficulty concentrating, remembering, or making decisions?: No Patient able to express need for assistance with ADLs?: Yes Does the patient have difficulty dressing or bathing?: No Independently performs ADLs?: Yes (appropriate for developmental age) Does the patient have difficulty walking or climbing stairs?: No Weakness of Legs: None Weakness of Arms/Hands: None  Permission Sought/Granted                  Emotional Assessment              Admission diagnosis:  Status post total replacement of right hip [Z96.641] Patient Active Problem List   Diagnosis Date Noted   Status post total  replacement of right hip 06/30/2022   Unilateral primary osteoarthritis, right hip 05/02/2022   Low testosterone in male 04/11/2022   AC joint pain 10/02/2021   Greater trochanteric bursitis of left hip 10/20/2020   Right lateral epicondylitis 06/26/2018   Nonallopathic lesion of thoracic region 12/04/2017   PAF (paroxysmal atrial fibrillation) ([place]) 10/31/2016   Chest pain due to coronary artery disease ([place]) 10/30/2016   Greater trochanteric bursitis of right hip 10/18/2016   Synovitis of left knee 07/05/2016   Degenerative disc disease, cervical 01/04/2016   Shoulder bursitis 07/10/2015   Polyarthralgia 07/10/2015   Episodic tension-type headache, not intractable 05/07/2015   Acute meniscal tear of right knee 05/06/2015   Neck pain of over 3 months duration 11/17/2014   Nonallopathic lesion of sacral region 11/17/2014   Nonallopathic lesion of lumbosacral region 11/17/2014   Nonallopathic lesion-rib cage 11/17/2014   Nonallopathic lesion of cervical region 11/17/2014   Left elbow pain 11/04/2014   Postconcussion syndrome 02/14/2014   Head injury 01/06/2014   BMI 31.0-31.9,adult 04/24/2013   DDD (degenerative disc disease), lumbosacral 04/19/2011   Diverticulosis of colon (without mention of hemorrhage) 10/11/2010   Routine general medical examination at a health care  facility 09/22/2010   PCP:  Bernerd Limbo, MD Pharmacy:   [facility], [place], [place] - [address] [place] [address] Phone: [phone] Fax: [phone]  [facility], [place] [address] Phone: [phone] Fax: [place], [place] - [phone] [place] [address] [place] [address] Phone: [phone] Fax: [phone]     Social Determinants of Health (SDOH) Social History: [place]: No Food Insecurity (06/30/2022)  Housing: Low  Risk  (06/30/2022)  Transportation Needs: No Transportation Needs (06/30/2022)  Utilities: Not At Risk (06/30/2022)  Depression (PHQ2-9): Low Risk  (10/28/2019)  Tobacco Use: Low Risk  (06/30/2022)   SDOH Interventions:     Readmission Risk Interventions     No data to display

## 2022-06-30 NOTE — Discharge Instructions (Signed)

## 2022-06-30 NOTE — H&P (Signed)
TOTAL HIP ADMISSION H&P  Patient is admitted for right total hip arthroplasty.  Subjective:  Chief Complaint: right hip pain  HPI: Daniel [name], 72 y.o. male, has a history of pain and functional disability in the right hip(s) due to arthritis and patient has failed non-surgical conservative treatments for greater than 12 weeks to include NSAID's and/or analgesics, corticosteriod injections, and activity modification.  Onset of symptoms was abrupt starting 1 years ago with gradually worsening course since that time.The patient noted no past surgery on the right hip(s).  Patient currently rates pain in the right hip at 10 out of 10 with activity. Patient has night pain, worsening of pain with activity and weight bearing, pain that interfers with activities of daily living, and pain with passive range of motion. Patient has evidence of subchondral sclerosis, periarticular osteophytes, and joint space narrowing by imaging studies. This condition presents safety issues increasing the risk of falls.  There is no current active infection.  Patient Active Problem List   Diagnosis Date Noted   Unilateral primary osteoarthritis, right hip 05/02/2022   Low testosterone in male 04/11/2022   AC joint pain 10/02/2021   Greater trochanteric bursitis of left hip 10/20/2020   Right lateral epicondylitis 06/26/2018   Nonallopathic lesion of thoracic region 12/04/2017   PAF (paroxysmal atrial fibrillation) ([place]) 10/31/2016   Chest pain due to coronary artery disease ([place]) 10/30/2016   Greater trochanteric bursitis of right hip 10/18/2016   Synovitis of left knee 07/05/2016   Degenerative disc disease, cervical 01/04/2016   Shoulder bursitis 07/10/2015   Polyarthralgia 07/10/2015   Episodic tension-type headache, not intractable 05/07/2015   Acute meniscal tear of right knee 05/06/2015   Neck pain of over 3 months duration 11/17/2014   Nonallopathic lesion of sacral region 11/17/2014   Nonallopathic  lesion of lumbosacral region 11/17/2014   Nonallopathic lesion-rib cage 11/17/2014   Nonallopathic lesion of cervical region 11/17/2014   Left elbow pain 11/04/2014   Postconcussion syndrome 02/14/2014   Head injury 01/06/2014   BMI 31.0-31.9,adult 04/24/2013   DDD (degenerative disc disease), lumbosacral 04/19/2011   Diverticulosis of colon (without mention of hemorrhage) 10/11/2010   Routine general medical examination at a health care facility 09/22/2010   Past Medical History:  Diagnosis Date   Acne    Anxiety    DDD (degenerative disc disease), lumbosacral    chronic back pain   Diverticulosis of colon (without mention of hemorrhage)    [facility] 09/2010   History of atrial fibrillation 10/30/2016   Low back pain radiating to both legs     Past Surgical History:  Procedure Laterality Date   CATARACT EXTRACTION Left 07/14/2015   COLONOSCOPY     CYST EXCISION Left 06/23/2015   Procedure: EXCISION MASS LEFT INDEX FINGER;  Surgeon: Daryll Brod, MD;  Location: [facility];  Service: Orthopedics;  Laterality: Left;  ANESTHESIA: IV REGIONAL/FAB   HERNIA REPAIR     REFRACTIVE SURGERY     left eye/ with enhancement   TONSILLECTOMY      Current Facility-Administered Medications  Medication Dose Route Frequency Provider Last Rate Last Admin   acetaminophen (TYLENOL) tablet 1,000 mg  1,000 mg Oral Once Brennan Bailey, MD       ceFAZolin (ANCEF) IVPB 2g/100 mL premix  2 g Intravenous On Call to OR Mcarthur Rossetti, MD       lactated ringers infusion   Intravenous Continuous Ellender, Karyl Kinnier, MD       povidone-iodine 10 %  swab 2 Application  2 Application Topical Once Mcarthur Rossetti, MD       Allergies  Allergen Reactions   Durezol [Difluprednate] Swelling    These eye drops cause swelling    Social History   Tobacco Use   Smoking status: Never   Smokeless tobacco: Never  Substance Use Topics   Alcohol use: Yes    Comment: occ    Family History   Problem Relation Age of Onset   Diabetes Mother    Hypertension Mother    Arthritis Mother    Arthritis Father    Heart disease Father    Diabetes Sister    Cancer Neg Hx    Stroke Neg Hx    Kidney disease Neg Hx    Early death Neg Hx      Review of Systems  Objective:  Physical Exam Vitals reviewed.  Constitutional:      Appearance: Normal appearance. He is normal weight.  HENT:     Head: Normocephalic and atraumatic.  Eyes:     Extraocular Movements: Extraocular movements intact.     Pupils: Pupils are equal, round, and reactive to light.  Cardiovascular:     Rate and Rhythm: Normal rate and regular rhythm.     Pulses: Normal pulses.  Pulmonary:     Effort: Pulmonary effort is normal.     Breath sounds: Normal breath sounds.  Abdominal:     Palpations: Abdomen is soft.  Musculoskeletal:     Cervical back: Normal range of motion and neck supple.     Right hip: Tenderness and bony tenderness present. Decreased range of motion. Decreased strength.  Neurological:     Mental Status: He is alert and oriented to person, place, and time.  Psychiatric:        Behavior: Behavior normal.     Vital signs in last 24 hours: Temp:  [97.6 F (36.4 C)] 97.6 F (36.4 C) (02/08 0547) Pulse Rate:  [77] 77 (02/08 0547) Resp:  [18] 18 (02/08 0547) BP: (154)/(82) 154/82 (02/08 0547) SpO2:  [95 %] 95 % (02/08 0547) Weight:  [96.2 kg] 96.2 kg (02/08 0547)  Labs:   Estimated body mass index is 33.2 kg/m as calculated from the following:   Height as of this encounter: '5\' 7"'$  (1.702 m).   Weight as of this encounter: 96.2 kg.   Imaging Review Plain radiographs and especially MRI demonstrates severe degenerative joint disease of the right hip(s). The bone quality appears to be excellent for age and reported activity level.      Assessment/Plan:  End stage arthritis, right hip(s)  The patient history, physical examination, clinical judgement of the provider and imaging  studies are consistent with end stage degenerative joint disease of the right hip(s) and total hip arthroplasty is deemed medically necessary. The treatment options including medical management, injection therapy, arthroscopy and arthroplasty were discussed at length. The risks and benefits of total hip arthroplasty were presented and reviewed. The risks due to aseptic loosening, infection, stiffness, dislocation/subluxation,  thromboembolic complications and other imponderables were discussed.  The patient acknowledged the explanation, agreed to proceed with the plan and consent was signed. Patient is being admitted for inpatient treatment for surgery, pain control, PT, OT, prophylactic antibiotics, VTE prophylaxis, progressive ambulation and ADL's and discharge planning.The patient is planning to be discharged home with home health services

## 2022-06-30 NOTE — Anesthesia Procedure Notes (Signed)
Spinal  Patient location during procedure: OR Start time: 06/30/2022 7:35 AM End time: 06/30/2022 7:39 AM Reason for block: surgical anesthesia Staffing Performed: anesthesiologist  Anesthesiologist: Roderic Palau, MD Performed by: Roderic Palau, MD Authorized by: Brennan Bailey, MD   Preanesthetic Checklist Completed: patient identified, IV checked, risks and benefits discussed, surgical consent, monitors and equipment checked, pre-op evaluation and timeout performed Spinal Block Patient position: sitting Prep: DuraPrep Patient monitoring: cardiac monitor, continuous pulse ox and blood pressure Approach: midline Location: L3-4 Injection technique: single-shot Needle Needle type: Pencan  Needle gauge: 24 G Needle length: 9 cm Assessment Sensory level: T8 Events: CSF return Additional Notes Functioning IV was confirmed and monitors were applied. Sterile prep and drape, including hand hygiene and sterile gloves were used. The patient was positioned and the spine was prepped. The skin was anesthetized with lidocaine.  Free flow of clear CSF was obtained prior to injecting local anesthetic into the CSF.  The spinal needle aspirated freely following injection.  The needle was carefully withdrawn.  The patient tolerated the procedure well.

## 2022-06-30 NOTE — Op Note (Signed)
Operative Note  Date of operation: 06/30/2022 Preoperative diagnosis: Right hip primary osteoarthritis Postoperative diagnosis: Same  Procedure: Right direct anterior total hip arthroplasty  Implants: DePuy sector GRIPTION acetabular opponent size 54, 36+4 neutral polythene liner, size 6 Actis femoral component with standard offset, 36+1.5 ceramic head ball  Surgeon: Lind Guest. Ninfa Linden, MD Assistant: Benita Stabile, PA-C  Anesthesia: Spinal Antibiotics: 2 g IV Ancef EBL: 517 cc Complications: None  Indications: The patient is a 72 year old gentleman with unfortunate debilitating arthritis involving his right hip.  This has been verified with MRI and clinical exam findings.  He has daily right hip pain that has been slowly worsening over the last year.  An MRI also confirms significant cartilage loss in the right hip.  He has tried failed all forms conservative treatment and at this point we have recommended a total hip arthroplasty and he does wish to proceed with this as well.  We talked in length in detail the risk of acute blood loss anemia, nerve vessel injury, fracture, infection, DVT, dislocation, implant failure, leg length differences and wound healing issues.  We talked about her goals being decreased pain, improve mobility, and improve quality of life.  Procedure description: After informed consent was obtained and the appropriate right hip was marked, the patient was brought to the operating room and set up on the stretcher where spinal anesthesia was obtained.  He was then laid in supine position on stretcher and a Foley catheter was placed.  Traction boots were placed on both his feet and he was then placed supine on the Hana fracture table with a perineal post in place in both legs and inline skeletal traction devices but no traction applied.  His right operative hip was prepped and draped with DuraPrep and sterile drapes.  A timeout was called and he was identified as the correct  patient the correct right hip.  An incision was then made just inferior and posterior to the ASIS and carried slightly likely down the leg.  Dissection was carried down to the tensor fascia lata muscle and the tensor fascia was then divided longitudinally to proceed with a direct and approach the hip.  Circumflex vessels were identified and cauterized.  The hip capsule was identified and opened up in L-type format finding just a mild effusion but certainly lateral periarticular osteophytes around the femoral head neck.  Cobra retractors were then placed around the medial lateral femoral neck and a femoral neck cut was made with an oscillating saw just proximal to the lesser trochanter and this was completed with an osteotome.  A corkscrew guide was placed in the femoral head and the femoral head was removed in its entirety and there was a wide area with significant cartilage loss.  A bent Hohmann was then placed over the medial acetabular rim and remnants of the acetabular labrum and other debris removed.  Reaming was then initiated from a size 43 reamer and stepwise increments going up to a size 53 reamer with all reamers placed under direct visualization and the last reamer was placed under direct fluoroscopy in order to obtain the depth of reaming, the inclination and the anteversion.  The real DePuy sector GRIPTION acetabular pendant size 54 was then placed again under direct visualization and fluoroscopy without difficulty.  We went with a 36+4 polythene liner for that size 54 acetabular component.  Attention was then turned to the femur.  With the right leg externally rotated to 120 degrees, extended and adducted, a Mueller retractor  was placed by the medial calcar and a Hohmann retractors was placed behind the greater trochanter.  The lateral joint capsule was released and a box cutting osteotome was used into the femoral canal.  Broaching was then initiated from a size 0 broach and stepwise increments going  up to a size 6 broach.  With a 6 broach in place we trialed a standard offset femoral neck and a 36+15 trial hip ball.  The leg was brought over and up and with traction and internal rotation was loose in the pelvis.  We assessed it clinically and radiographically and were pleased with leg length, offset, range of motion and stability.  The hip was then dislocated and we remove the trial components.  We then placed the real Actis femoral component with standard offset size 6 and the real 36+1.5 ceramic hip ball.  Again this was reduced in the acetabulum and we are pleased with stability assessed again mechanically and radiographically.  The soft tissue was then irrigated with normal saline solution.  Remnants of the joint capsule was closed with interrupted #1 Ethibond suture followed by a #1 Vicryl suture to close the tensor fascia followed by 0 Vicryl close the deep tissue and 2-0 Vicryl close subcutaneous tissue.  The skin was closed with staples.  An Aquacel dressing was applied.  The patient was taken off of the Hana table and taken the recovery room in stable condition.  Benita Stabile, PA-C did assist during the entire case from beginning to end and his assistance was medically necessary and crucial for soft tissue management and retraction, helping guide implant placement and a layered closure of the wound.

## 2022-07-01 LAB — CBC
HCT: 39.3 % (ref 39.0–52.0)
Hemoglobin: 13.1 g/dL (ref 13.0–17.0)
MCH: 31 pg (ref 26.0–34.0)
MCHC: 33.3 g/dL (ref 30.0–36.0)
MCV: 93.1 fL (ref 80.0–100.0)
Platelets: 223 10*3/uL (ref 150–400)
RBC: 4.22 MIL/uL (ref 4.22–5.81)
RDW: 13.4 % (ref 11.5–15.5)
WBC: 11 10*3/uL — ABNORMAL HIGH (ref 4.0–10.5)
nRBC: 0 % (ref 0.0–0.2)

## 2022-07-01 LAB — BASIC METABOLIC PANEL
Anion gap: 8 (ref 5–15)
BUN: 10 mg/dL (ref 8–23)
CO2: 26 mmol/L (ref 22–32)
Calcium: 8.8 mg/dL — ABNORMAL LOW (ref 8.9–10.3)
Chloride: 100 mmol/L (ref 98–111)
Creatinine, Ser: 0.75 mg/dL (ref 0.61–1.24)
GFR, Estimated: >60 mL/min (ref >60)
Glucose, Bld: 143 mg/dL — ABNORMAL HIGH (ref 70–99)
Potassium: 4.3 mmol/L (ref 3.5–5.1)
Sodium: 134 mmol/L — ABNORMAL LOW (ref 135–145)

## 2022-07-01 NOTE — Evaluation (Signed)
Occupational Therapy Evaluation Patient Details Name: Ioane Constanza MRN: [id number] DOB: 1951-02-26 Today's Date: 07/01/2022   History of Present Illness Pt is a 72 y.o. male who presented 06/30/22 for direct anterior approach right THA. PMH: afib, anxiety   Clinical Impression   PTA, pt lived alone and was mod I. Upon eval, pt performing UB ADL with set-up and LB ADL with min guard A with AE. Pt currently with limitations in bed mobility due to difficulty moving RLE laterally; issued gait belt to accelerate RLE, and able to perform with HOB elevated for back support, but pt with more difficulty coordinating with HOB flat. Pt educated and demonstrating use of compensatory techniques for LB dressing, shower transfers, safe transfers with RW, and bed mobility. Pt reporting concerns about home alone with limited support; RN notified and encouraged pt to voice concerns to MP/PA during rounding. Follow physician orders for d/c recommendations.      Recommendations for follow up therapy are one component of a multi-disciplinary discharge planning process, led by the attending physician.  Recommendations may be updated based on patient status, additional functional criteria and insurance authorization.   Follow Up Recommendations  Follow physician's recommendations for discharge plan and follow up therapies     Assistance Recommended at Discharge Intermittent Supervision/Assistance  Patient can return home with the following A little help with walking and/or transfers;A little help with bathing/dressing/bathroom;Assistance with cooking/housework;Assist for transportation;Help with stairs or ramp for entrance    Functional Status Assessment  Patient has had a recent decline in their functional status and demonstrates the ability to make significant improvements in function in a reasonable and predictable amount of time.  Equipment Recommendations  None recommended by OT    Recommendations for Other  Services       Precautions / Restrictions Precautions Precautions: Fall Restrictions Weight Bearing Restrictions: Yes RLE Weight Bearing: Weight bearing as tolerated Other Position/Activity Restrictions: no hip precautions orders      Mobility Bed Mobility Overal bed mobility: Needs Assistance Bed Mobility: Supine to Sit     Supine to sit: Min guard, HOB elevated     General bed mobility comments: use of gait belt to progress RLE. Observing decresaed core strength during bed mobility. Would require physical assist if [facility] was not elevated as in home situation.    Transfers Overall transfer level: Needs assistance Equipment used: Rolling walker (2 wheels) Transfers: Sit to/from Stand Sit to Stand: Min guard           General transfer comment: Cued pt for hand placement with transfers. Pt needing cues to slide R leg anteriorly to reduce R hip pain with transferring stand to sit to a lower surface. Min guard for safety      Balance Overall balance assessment: Needs assistance Sitting-balance support: No upper extremity supported, Feet supported Sitting balance-Leahy Scale: Fair Sitting balance - Comments: Static sitting EOB with supervision for safety   Standing balance support: Bilateral upper extremity supported, During functional activity, Reliant on assistive device for balance Standing balance-Leahy Scale: Poor Standing balance comment: Reliant on RW                           ADL either performed or assessed with clinical judgement   ADL Overall ADL's : Needs assistance/impaired Eating/Feeding: Independent   Grooming: Supervision/safety;Standing;Wash/dry hands   Upper Body Bathing: Set up;Sitting   Lower Body Bathing: Min guard;Sit to/from stand;With adaptive equipment Lower Body Bathing Details (indicate cue  type and reason): educated regarding long handled lufa Upper Body Dressing : Set up;Sitting   Lower Body Dressing: Min guard;Sit to/from  stand;With adaptive equipment Lower Body Dressing Details (indicate cue type and reason): Pt ed and demonstrating with AE with min guard A. Educating regarding where AE can be found Toilet Transfer: Min guard;BSC/3in1;Rolling walker (2 wheels);Ambulation Toilet Transfer Details (indicate cue type and reason): requires BUE to push up at this time, thus, BSC over toilet Toileting- Clothing Manipulation and Hygiene: Min guard;Sitting/lateral lean   Tub/ Shower Transfer: Gaffer;Shower seat;Ambulation;Min guard Tub/Shower Transfer Details (indicate cue type and reason): simulated in room Functional mobility during ADLs: Supervision/safety;Min guard;Rolling walker (2 wheels) General ADL Comments: incresaed time and effort during ADL     Vision Baseline Vision/History: 0 No visual deficits Ability to See in Adequate Light: 0 Adequate Patient Visual Report: No change from baseline Vision Assessment?: No apparent visual deficits     Perception Perception Perception Tested?: No   Praxis Praxis Praxis tested?: Not tested    Pertinent Vitals/Pain Pain Assessment Pain Assessment: No/denies pain Pain Score: 6  Pain Location: R hip Pain Descriptors / Indicators: Discomfort, Grimacing, Operative site guarding Pain Intervention(s): Limited activity within patient's tolerance, Monitored during session     Hand Dominance Right   Extremity/Trunk Assessment Upper Extremity Assessment Upper Extremity Assessment: Generalized weakness (BUE shaky with extended use of RW. Adjusted RW at end of session to optimize usability)   Lower Extremity Assessment Lower Extremity Assessment: Defer to PT evaluation   Cervical / Trunk Assessment Cervical / Trunk Assessment: Normal   Communication Communication Communication: No difficulties   Cognition Arousal/Alertness: Awake/alert Behavior During Therapy: WFL for tasks assessed/performed Overall Cognitive Status: Within Functional Limits for  tasks assessed                                       General Comments       Exercises     Shoulder Instructions      Home Living Family/patient expects to be discharged to:: Private residence Living Arrangements: Alone Available Help at Discharge: Family;Available PRN/intermittently (Pt reports he does not really have anyone who can assist at home) Type of Home: House Home Access: Ramped entrance     Home Layout: One level     Bathroom Shower/Tub: Occupational psychologist:  (comfort height) Bathroom Accessibility: Yes   Home Equipment: International aid/development worker (2 wheels);Rollator (4 wheels);Cane - single point          Prior Functioning/Environment Prior Level of Function : Independent/Modified Independent;Driving;Working/employed             Mobility Comments: Used SPC for longer community distance mobility, appeared to be more for pain management. Otherwise did not need AD. No falls. 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        OT Problem List: Decreased strength;Decreased range of motion;Decreased activity tolerance;Impaired balance (sitting and/or standing);Decreased knowledge of use of DME or AE;Decreased knowledge of precautions;Pain      OT Treatment/Interventions: Self-care/ADL training;Therapeutic exercise;DME and/or AE instruction;Balance training;Patient/family education;Therapeutic activities    OT Goals(Current goals can be found in the care plan section) Acute Rehab OT Goals Patient Stated Goal: go to rehab before home OT Goal Formulation: With patient Time For Goal Achievement: 07/15/22 Potential to Achieve Goals: Good  OT Frequency: Min 2X/week    Co-evaluation  AM-PAC OT "6 Clicks" Daily Activity     Outcome Measure Help from another person eating meals?: None Help from another person taking care of personal grooming?: A Little Help from another  person toileting, which includes using toliet, bedpan, or urinal?: A Little Help from another person bathing (including washing, rinsing, drying)?: A Little Help from another person to put on and taking off regular upper body clothing?: A Little Help from another person to put on and taking off regular lower body clothing?: A Little 6 Click Score: 19   End of Session Equipment Utilized During Treatment: Gait belt;Rolling walker (2 wheels);Other (comment) (sock aid, reacher, shoe horn, ICEman) Nurse Communication: Mobility status  Activity Tolerance: Patient tolerated treatment well Patient left: in chair;with call bell/phone within reach  OT Visit Diagnosis: Unsteadiness on feet (R26.81);Muscle weakness (generalized) (M62.81);Other abnormalities of gait and mobility (R26.89);Pain Pain - Right/Left: Right Pain - part of body: Hip                Time: [id number] OT Time Calculation (min): 40 min Charges:  OT General Charges $OT Visit: 1 Visit OT Evaluation $OT Eval Low Complexity: 1 Low OT Treatments $Self Care/Home Management : 23-37 mins  Elder Cyphers, OTR/L [facility] Acute Rehabilitation Office: [phone]  Magnus Ivan 07/01/2022, 9:27 AM

## 2022-07-01 NOTE — Progress Notes (Signed)
Mobility Specialist Progress Note    07/01/22 1248  Mobility  Activity Ambulated with assistance in hallway  Level of Assistance Contact guard assist, steadying assist  Assistive Device Front wheel walker  Distance Ambulated (ft) 370 ft  RLE Weight Bearing WBAT  Activity Response Tolerated well  Mobility Referral Yes  $Mobility charge 1 Mobility   Pt received in chair and agreeable. C/o hip pain on walk. Returned to chair with call bell in reach.   Hildred Alamin Mobility Specialist  Please Psychologist, sport and exercise or Rehab Office at [phone]

## 2022-07-01 NOTE — Progress Notes (Signed)
Physical Therapy Treatment Patient Details Name: Daniel [name] MRN: [id number] DOB: 1950-05-28 Today's Date: 07/01/2022    History of Present Illness Pt is a 72 y.o. male who presented 06/30/22 for direct anterior approach right THA. PMH: afib, anxiety    PT Comments    Pt was received sitting in the chair, pleasant, and agreeable to therapy. Pt reports increased R hip soreness this session, which limited gait distance. Pt was able to demonstrate HEP exercises with cues for technique. Pt able to stand with min guard, however demonstrating increased pain and slowness to reach upright posture. Pt with RLE buckling once after initially standing due to pain, but pt able to self-correct. Pt demonstrating reduced WB and stance time on RLE further into gait trial. Pt continues to benefit from PT services to progress toward functional mobility goals.    Recommendations for follow up therapy are one component of a multi-disciplinary discharge planning process, led by the attending physician.  Recommendations may be updated based on patient status, additional functional criteria and insurance authorization.  Follow Up Recommendations  Follow physician's recommendations for discharge plan and follow up therapies     Assistance Recommended at Discharge PRN  Patient can return home with the following A little help with bathing/dressing/bathroom;Assistance with cooking/housework;Assist for transportation;Help with stairs or ramp for entrance   Equipment Recommendations  Rolling walker (2 wheels)    Recommendations for Other Services       Precautions / Restrictions Precautions Precautions: Fall Restrictions Weight Bearing Restrictions: Yes RLE Weight Bearing: Weight bearing as tolerated Other Position/Activity Restrictions: no hip precautions orders (case manager noted that his surgeon does not want his THA pts performing hip abduction exercises)     Mobility  Bed Mobility                General bed mobility comments: Pt sitting in recliner on arrival    Transfers Overall transfer level: Needs assistance Equipment used: Rolling walker (2 wheels) Transfers: Sit to/from Stand Sit to Stand: Min guard           General transfer comment: Min guard for safety and increased time to achieve upright posture due to pain.    Ambulation/Gait Ambulation/Gait assistance: Supervision Gait Distance (Feet): 165 Feet Assistive device: Rolling walker (2 wheels) Gait Pattern/deviations: Step-through pattern, Step-to pattern, Decreased stance time - right, Trunk flexed, Antalgic Gait velocity: reduced     General Gait Details: Pt required cues for closer proximity to RW and upright posture.     Balance Overall balance assessment: Needs assistance Sitting-balance support: No upper extremity supported, Feet supported Sitting balance-Leahy Scale: Fair Sitting balance - Comments: sitting in recliner   Standing balance support: Bilateral upper extremity supported, During functional activity Standing balance-Leahy Scale: Fair Standing balance comment: with RW support                            Cognition Arousal/Alertness: Awake/alert Behavior During Therapy: WFL for tasks assessed/performed Overall Cognitive Status: Within Functional Limits for tasks assessed                                          Exercises Total Joint Exercises Ankle Circles/Pumps: AROM, Both, Seated, 5 reps Quad Sets: AROM, Seated, Both, 5 reps Short Arc Quad: AROM, Seated, Right, 5 reps Heel Slides: AAROM, Right, Seated, 5 reps (with use of  gait belt) Hip ABduction/ADduction: AROM, Right, 10 reps, Standing Long Arc Quad: AROM, Seated, Right, 5 reps Knee Flexion: Standing, AROM, Right, 5 reps Marching in Standing: AROM, Right, Standing, 5 reps Standing Hip Extension: AROM, Right, 5 reps, Standing    General Comments General comments (skin integrity, edema, etc.): no  s/sx of distress during session. Pt provided HEP and plan to review exercises next session.      Pertinent Vitals/Pain Pain Assessment Pain Assessment: 0-10 Pain Score: 7  Faces Pain Scale: Hurts even more Pain Location: R hip Pain Descriptors / Indicators: Discomfort, Grimacing, Operative site guarding Pain Intervention(s): Limited activity within patient's tolerance, Monitored during session, Ice applied, Repositioned     PT Goals (current goals can now be found in the care plan section) Acute Rehab PT Goals Patient Stated Goal: to be independent PT Goal Formulation: With patient Time For Goal Achievement: 07/14/22 Potential to Achieve Goals: Good Progress towards PT goals: Progressing toward goals    Frequency    7X/week      PT Plan Current plan remains appropriate       AM-PAC PT "6 Clicks" Mobility   Outcome Measure  Help needed turning from your back to your side while in a flat bed without using bedrails?: A Little Help needed moving from lying on your back to sitting on the side of a flat bed without using bedrails?: A Little Help needed moving to and from a bed to a chair (including a wheelchair)?: A Little Help needed standing up from a chair using your arms (e.g., wheelchair or bedside chair)?: A Little Help needed to walk in hospital room?: A Little Help needed climbing 3-5 steps with a railing? : A Lot 6 Click Score: 17    End of Session Equipment Utilized During Treatment: Gait belt Activity Tolerance: Patient tolerated treatment well Patient left: in chair;with call bell/phone within reach;with chair alarm set Nurse Communication: Mobility status PT Visit Diagnosis: Unsteadiness on feet (R26.81);Other abnormalities of gait and mobility (R26.89);Muscle weakness (generalized) (M62.81);Difficulty in walking, not elsewhere classified (R26.2);Pain Pain - Right/Left: Right Pain - part of body: Hip     Time: [id number] PT Time Calculation (min) (ACUTE  ONLY): 32 min  Charges:  $Gait Training: 8-22 mins $Therapeutic Exercise: 8-22 mins                     Michelle Nasuti, PTA Acute Rehabilitation Services Secure Chat Preferred  [phone]    Michelle Nasuti 07/01/2022, 2:52 PM

## 2022-07-01 NOTE — TOC Progression Note (Signed)
Transition of Care [facility]) - Progression Note    Patient Details  Name: Daniel [name] MRN: [id number] Date of Birth: 1950/07/30  [facility] CM/SW Contact  Sharin Mons, RN Phone Number: 07/01/2022, 9:47 AM  Clinical Narrative:          - s/p R total hip arthroplasty, 2/8  NCM @ bedside to discuss disposition needs. Pt states from home alone. No friend / family  support to assist with care once d/c. States he  is currently unable to care for self independently at  home given his  current physical needs and fall risk. Patient expressed and is agreeable to SNF placement at time of discharge. Patient reports preference for  [facility] SNF/Rehab. NCM discussed insurance authorization process and  will provided Medicare SNF ratings list. Patient expressed being hopeful for rehab and to feel better soon. No further questions reported at this time. RNCM to continue to follow and assist with discharge planning needs.    Expected Discharge Plan: [place] Barriers to Discharge: Continued Medical Work up  Expected Discharge Plan and Services                                               Social Determinants of Health (SDOH) Interventions SDOH Screenings   Food Insecurity: No Food Insecurity (06/30/2022)  Housing: Low Risk  (06/30/2022)  Transportation Needs: No Transportation Needs (06/30/2022)  Utilities: Not At Risk (06/30/2022)  Depression (PHQ2-9): Low Risk  (10/28/2019)  Tobacco Use: Low Risk  (06/30/2022)    Readmission Risk Interventions     No data to display

## 2022-07-01 NOTE — NC FL2 (Signed)
[place] LEVEL OF CARE FORM     IDENTIFICATION  Patient Name: Daniel [name] Birthdate: [birth year] Sex: male Admission Date (Current Location): 06/30/2022  [facility] and [place] Number:  Herbalist and Address:  [facility], [place] [address] [place], [place] [address]      Provider Number: [id number]  Attending Physician Name and Address:  Mcarthur Rossetti,*  Relative Name and Phone Number:  Llana Aliment   [phone]    Current Level of Care: Hospital Recommended Level of Care: [place] Prior Approval Number:    Date Approved/Denied:   PASRR Number: [id number]  Discharge Plan: SNF    Current Diagnoses: Patient Active Problem List   Diagnosis Date Noted   Status post total replacement of right hip 06/30/2022   Unilateral primary osteoarthritis, right hip 05/02/2022   Low testosterone in male 04/11/2022   [facility] joint pain 10/02/2021   Greater trochanteric bursitis of left hip 10/20/2020   Right lateral epicondylitis 06/26/2018   Nonallopathic lesion of thoracic region 12/04/2017   PAF (paroxysmal atrial fibrillation) ([place]) 10/31/2016   Chest pain due to coronary artery disease ([place]) 10/30/2016   Greater trochanteric bursitis of right hip 10/18/2016   Synovitis of left knee 07/05/2016   Degenerative disc disease, cervical 01/04/2016   Shoulder bursitis 07/10/2015   Polyarthralgia 07/10/2015   Episodic tension-type headache, not intractable 05/07/2015   Acute meniscal tear of right knee 05/06/2015   Neck pain of over 3 months duration 11/17/2014   Nonallopathic lesion of sacral region 11/17/2014   Nonallopathic lesion of lumbosacral region 11/17/2014   Nonallopathic lesion-rib cage 11/17/2014   Nonallopathic lesion of cervical region 11/17/2014   Left elbow pain 11/04/2014   Postconcussion syndrome 02/14/2014   Head injury 01/06/2014   BMI 31.0-31.9,adult 04/24/2013   DDD (degenerative disc  disease), lumbosacral 04/19/2011   Diverticulosis of colon (without mention of hemorrhage) 10/11/2010   Routine general medical examination at a health care facility 09/22/2010    Orientation RESPIRATION BLADDER Height & Weight     Self, Time, Situation, Place  Normal Continent Weight: 212 lb (96.2 kg) Height:  5' 7"$  (170.2 cm)  BEHAVIORAL SYMPTOMS/MOOD NEUROLOGICAL BOWEL NUTRITION STATUS      Continent Diet (see discharge summary)  AMBULATORY STATUS COMMUNICATION OF NEEDS Skin   Supervision Verbally Surgical wounds                       Personal Care Assistance Level of Assistance  Bathing, Feeding, Dressing Bathing Assistance: Independent Feeding assistance: Independent Dressing Assistance: Independent     Functional Limitations Info  Sight, Hearing, Speech Sight Info: Adequate Hearing Info: Adequate Speech Info: Adequate    SPECIAL CARE FACTORS FREQUENCY  PT (By licensed PT), OT (By licensed OT)     PT Frequency: 5x week OT Frequency: 5x week            Contractures Contractures Info: Not present    Additional Factors Info  Code Status, Allergies Code Status Info: full Allergies Info: Durezol (Difluprednate)           Current Medications (07/01/2022):  This is the current hospital active medication list Current Facility-Administered Medications  Medication Dose Route Frequency Provider Last Rate Last Admin   0.9 %  sodium chloride infusion   Intravenous Continuous Mcarthur Rossetti, MD 75 mL/hr at 06/30/22 1120 New Bag at 06/30/22 1120   acetaminophen (TYLENOL) tablet 325-650 mg  325-650 mg Oral Q6H  PRN Mcarthur Rossetti, MD       alum & mag hydroxide-simeth (MAALOX/MYLANTA) 200-200-20 MG/5ML suspension 30 mL  30 mL Oral Q4H PRN Mcarthur Rossetti, MD       amitriptyline (ELAVIL) tablet 25 mg  25 mg Oral QHS Mcarthur Rossetti, MD   25 mg at 06/30/22 2217   aspirin chewable tablet 81 mg  81 mg Oral BID Mcarthur Rossetti, MD    81 mg at 07/01/22 0809   cholecalciferol (VITAMIN D3) 25 MCG (1000 UNIT) tablet 1,000 Units  1,000 Units Oral Daily Mcarthur Rossetti, MD   1,000 Units at 07/01/22 0809   diphenhydrAMINE (BENADRYL) 12.5 MG/5ML elixir 12.5-25 mg  12.5-25 mg Oral Q4H PRN Mcarthur Rossetti, MD       docusate sodium (COLACE) capsule 100 mg  100 mg Oral BID Mcarthur Rossetti, MD   100 mg at 07/01/22 0809   doxycycline (VIBRAMYCIN) 50 MG capsule 50 mg  50 mg Oral Daily Mcarthur Rossetti, MD   50 mg at 07/01/22 0817   ezetimibe (ZETIA) tablet 10 mg  10 mg Oral Daily Mcarthur Rossetti, MD   10 mg at 07/01/22 0810   HYDROmorphone (DILAUDID) injection 0.5-1 mg  0.5-1 mg Intravenous Q4H PRN Mcarthur Rossetti, MD       HYDROmorphone (DILAUDID) tablet 2-3 mg  2-3 mg Oral Q4H PRN Mcarthur Rossetti, MD   2 mg at 07/01/22 0809   menthol-cetylpyridinium (CEPACOL) lozenge 3 mg  1 lozenge Oral PRN Mcarthur Rossetti, MD       Or   phenol (CHLORASEPTIC) mouth spray 1 spray  1 spray Mouth/Throat PRN Mcarthur Rossetti, MD       methocarbamol (ROBAXIN) tablet 500 mg  500 mg Oral Q6H PRN Mcarthur Rossetti, MD   500 mg at 06/30/22 2303   Or   methocarbamol (ROBAXIN) 500 mg in dextrose 5 % 50 mL IVPB  500 mg Intravenous Q6H PRN Mcarthur Rossetti, MD       metoCLOPramide (REGLAN) tablet 5-10 mg  5-10 mg Oral Q8H PRN Mcarthur Rossetti, MD       Or   metoCLOPramide (REGLAN) injection 5-10 mg  5-10 mg Intravenous Q8H PRN Mcarthur Rossetti, MD       ondansetron [facility]) tablet 4 mg  4 mg Oral Q6H PRN Mcarthur Rossetti, MD       Or   ondansetron [facility]) injection 4 mg  4 mg Intravenous Q6H PRN Mcarthur Rossetti, MD       oxyCODONE (Oxy IR/ROXICODONE) immediate release tablet 5-10 mg  5-10 mg Oral Q4H PRN Mcarthur Rossetti, MD   10 mg at 06/30/22 2302   pantoprazole (PROTONIX) EC tablet 40 mg  40 mg Oral Daily Mcarthur Rossetti, MD   40 mg at  06/30/22 1120     Discharge Medications: Please see discharge summary for a list of discharge medications.  Relevant Imaging Results:  Relevant Lab Results:   Additional Information SSN: [id number]  Joanne Chars, LCSW

## 2022-07-01 NOTE — TOC Progression Note (Signed)
Transition of Care [facility]) - Progression Note    Patient Details  Name: Daniel [name] MRN: [id number] Date of Birth: [birth year]  Transition of [facility]) CM/SW Contact  Joanne Chars, LCSW Phone Number: 07/01/2022, 11:13 AM  Clinical Narrative:  Pt is traditional medicare, admitted under observation status with recommendation for SNF.  Pt is [facility]C ACO, CSW confirmed with [facility] post acute care email that pt is eligible for SNF waiver--this was confirmed by email.  Referral sent out to eligible SNF facilities.  Pt requesting [facility] and CSW reached out to [facility] to review.       Expected Discharge Plan: [employer] Barriers to Discharge: Continued Medical Work up  Expected Discharge Plan and Services                                               Social Determinants of Health (SDOH) Interventions SDOH Screenings   Food Insecurity: No Food Insecurity (06/30/2022)  Housing: Low Risk  (06/30/2022)  Transportation Needs: No Transportation Needs (06/30/2022)  Utilities: Not At Risk (06/30/2022)  Depression (PHQ2-9): Low Risk  (10/28/2019)  Tobacco Use: Low Risk  (06/30/2022)    Readmission Risk Interventions     No data to display

## 2022-07-01 NOTE — Progress Notes (Signed)
Subjective: 1 Day Post-Op Procedure(s) (LRB): RIGHT TOTAL HIP ARTHROPLASTY ANTERIOR APPROACH (Right) Patient reports pain as moderate.    Objective: Vital signs in last 24 hours: Temp:  [97.5 F (36.4 C)-98.5 F (36.9 C)] 98.5 F (36.9 C) (02/09 0408) Pulse Rate:  [56-96] 78 (02/09 0408) Resp:  [12-18] 17 (02/09 0408) BP: (95-161)/(72-100) 140/73 (02/09 0408) SpO2:  [94 %-100 %] 96 % (02/09 0408)  Intake/Output from previous day: 02/08 0701 - 02/09 0700 In: 1567.7 [P.O.:480; I.V.:1037.7; IV Piggyback:50] Out: 1400 [Urine:1250; Blood:150] Intake/Output this shift: No intake/output data recorded.  Recent Labs    07/01/22 0312  HGB 13.1   Recent Labs    07/01/22 0312  WBC 11.0*  RBC 4.22  HCT 39.3  PLT 223   Recent Labs    07/01/22 0312  NA 134*  K 4.3  CL 100  CO2 26  BUN 10  CREATININE 0.75  GLUCOSE 143*  CALCIUM 8.8*   No results for input(s): "LABPT", "INR" in the last 72 hours.  Sensation intact distally Intact pulses distally Dorsiflexion/Plantar flexion intact Incision: scant drainage   Assessment/Plan: 1 Day Post-Op Procedure(s) (LRB): RIGHT TOTAL HIP ARTHROPLASTY ANTERIOR APPROACH (Right) Up with therapy  The patient does live at home alone but there is close by support.  We need to make sure that he is safe to transition to home environment as opposed to short-term skilled nursing.    Mcarthur Rossetti 07/01/2022, 6:33 AM

## 2022-07-01 NOTE — Progress Notes (Signed)
Physical Therapy Treatment Patient Details Name: Daniel [name] MRN: [id number] DOB: 03-Oct-1950 Today's Date: 07/01/2022   History of Present Illness Pt is a 72 y.o. male who presented 06/30/22 for direct anterior approach right THA. PMH: afib, anxiety    PT Comments    Pt was received sitting in recliner and agreeable to therapy. Pt was able to progress gait distance with supervision this session, however continues to demonstrate antalgic gait pattern and reduced RLE WB. Pt expressed concerns about returning home without assistance and reports possible need for low frequency post-acute rehab. After discussion with pt and case worker, pt agrees to assess progress and comfort returning home after participating in therapy over the weekend. Pt reports the RW he has access to at home is a bari-RW, so after discussion with supervising PT Anessa P, DME recommendation was updated to standard RW to better fit pt's needs. Pt continues to benefit from PT services to progress toward functional mobility goals.      Recommendations for follow up therapy are one component of a multi-disciplinary discharge planning process, led by the attending physician.  Recommendations may be updated based on patient status, additional functional criteria and insurance authorization.  Follow Up Recommendations  Follow physician's recommendations for discharge plan and follow up therapies     Assistance Recommended at Discharge PRN  Patient can return home with the following A little help with bathing/dressing/bathroom;Assistance with cooking/housework;Assist for transportation;Help with stairs or ramp for entrance   Equipment Recommendations  Rolling walker (2 wheels)    Recommendations for Other Services       Precautions / Restrictions Precautions Precautions: Fall Restrictions Weight Bearing Restrictions: Yes RLE Weight Bearing: Weight bearing as tolerated Other Position/Activity Restrictions: no hip precautions  orders (case manager noted that his surgeon does not want his THA pts performing hip abduction exercises)     Mobility  Bed Mobility               General bed mobility comments: Pt sitting in recliner on arrival    Transfers Overall transfer level: Needs assistance Equipment used: Rolling walker (2 wheels) Transfers: Sit to/from Stand Sit to Stand: Min guard           General transfer comment: Min guard for safety and pt demonstrating safe hand placement without cues    Ambulation/Gait Ambulation/Gait assistance: Min guard, Supervision Gait Distance (Feet): 300 Feet Assistive device: Rolling walker (2 wheels) Gait Pattern/deviations: Step-through pattern, Step-to pattern, Decreased stance time - right, Trunk flexed, Antalgic       General Gait Details: Pt initially demonstrating step-to pattern progressing to step-through with heavy reliance on RW throughout. Pt initially requiring min guard for safety progressing to supervision with cues for closer proximity to RW.       Balance Overall balance assessment: Needs assistance Sitting-balance support: No upper extremity supported, Feet supported Sitting balance-Leahy Scale: Fair Sitting balance - Comments: sitting in recliner   Standing balance support: Bilateral upper extremity supported, During functional activity Standing balance-Leahy Scale: Fair Standing balance comment: with RW support, but able to retrieve glasses out of cabinet with no UE support without LOB                            Cognition Arousal/Alertness: Awake/alert Behavior During Therapy: WFL for tasks assessed/performed Overall Cognitive Status: Within Functional Limits for tasks assessed  Exercises Total Joint Exercises Hip ABduction/ADduction: AROM, Right, 10 reps, Standing Marching in Standing: AROM, Both, 10 reps Standing Hip Extension: AROM, Right, 10 reps     General Comments General comments (skin integrity, edema, etc.): no s/sx of distress during session. Pt provided HEP and plan to review exercises next session.      Pertinent Vitals/Pain Pain Assessment Pain Assessment: Faces Faces Pain Scale: Hurts even more Pain Location: R hip Pain Descriptors / Indicators: Discomfort, Grimacing, Operative site guarding Pain Intervention(s): Limited activity within patient's tolerance, Monitored during session, Ice applied, Premedicated before session, Repositioned     PT Goals (current goals can now be found in the care plan section) Acute Rehab PT Goals Patient Stated Goal: to be independent PT Goal Formulation: With patient Time For Goal Achievement: 07/14/22 Potential to Achieve Goals: Good Progress towards PT goals: Progressing toward goals    Frequency    7X/week      PT Plan Current plan remains appropriate       AM-PAC PT "6 Clicks" Mobility   Outcome Measure  Help needed turning from your back to your side while in a flat bed without using bedrails?: A Little Help needed moving from lying on your back to sitting on the side of a flat bed without using bedrails?: A Little Help needed moving to and from a bed to a chair (including a wheelchair)?: A Little Help needed standing up from a chair using your arms (e.g., wheelchair or bedside chair)?: A Little Help needed to walk in hospital room?: A Little Help needed climbing 3-5 steps with a railing? : A Lot 6 Click Score: 17    End of Session Equipment Utilized During Treatment: Gait belt Activity Tolerance: Patient tolerated treatment well Patient left: in chair;with call bell/phone within reach;with chair alarm set Nurse Communication: Mobility status PT Visit Diagnosis: Unsteadiness on feet (R26.81);Other abnormalities of gait and mobility (R26.89);Muscle weakness (generalized) (M62.81);Difficulty in walking, not elsewhere classified (R26.2);Pain Pain - Right/Left:  Right Pain - part of body: Hip     Time: [id number] PT Time Calculation (min) (ACUTE ONLY): 29 min  Charges:  $Gait Training: 23-37 mins                     Michelle Nasuti, PTA Acute Rehabilitation Services Secure Chat Preferred  [phone]    Michelle Nasuti 07/01/2022, 11:06 AM

## 2022-07-01 NOTE — Plan of Care (Signed)
  Problem: Activity: Goal: Ability to tolerate increased activity will improve Outcome: Progressing   Problem: Clinical Measurements: Goal: Postoperative complications will be avoided or minimized Outcome: Progressing   Problem: Pain Management: Goal: Pain level will decrease with appropriate interventions Outcome: Progressing

## 2022-07-01 NOTE — TOC Progression Note (Addendum)
Transition of Care [facility]) - Progression Note    Patient Details  Name: Daniel [name] MRN: [id number] Date of Birth: [birth year]  [facility]) CM/SW Contact  Joanne Chars, LCSW Phone Number: 07/01/2022, 1:46 PM  Clinical Narrative:    [facility] full, cannot offer bed.  [facility] does offer.  CSW spoke with pt, provided bed offers and medicare choice document.  Pt had bad experience at [facility] with a family member and will not go there.  Discussed other options with medicare waiver: [facility], [facility], [facility].  Pt will review these options, interested in [facility] and [facility].    CSW reached out to [facility] and [facility] to review.    1445: both [facility] and [facility] have offered beds.  Pt is also walking 370 feet with mobility team.   Expected Discharge Plan: Skilled Nursing Facility Barriers to Discharge: Continued Medical Work up  Expected Discharge Plan and Services                                               Social Determinants of Health (SDOH) Interventions SDOH Screenings   Food Insecurity: No Food Insecurity (06/30/2022)  Housing: Low Risk  (06/30/2022)  Transportation Needs: No Transportation Needs (06/30/2022)  Utilities: Not At Risk (06/30/2022)  Depression (PHQ2-9): Low Risk  (10/28/2019)  Tobacco Use: Low Risk  (06/30/2022)    Readmission Risk Interventions     No data to display

## 2022-07-02 DIAGNOSIS — I2510 Atherosclerotic heart disease of native coronary artery without angina pectoris: Secondary | ICD-10-CM | POA: Diagnosis present

## 2022-07-02 DIAGNOSIS — M1611 Unilateral primary osteoarthritis, right hip: Secondary | ICD-10-CM | POA: Diagnosis present

## 2022-07-02 DIAGNOSIS — F419 Anxiety disorder, unspecified: Secondary | ICD-10-CM | POA: Diagnosis present

## 2022-07-02 DIAGNOSIS — Z888 Allergy status to other drugs, medicaments and biological substances status: Secondary | ICD-10-CM | POA: Diagnosis not present

## 2022-07-02 DIAGNOSIS — I480 Paroxysmal atrial fibrillation: Secondary | ICD-10-CM | POA: Diagnosis present

## 2022-07-02 DIAGNOSIS — Z833 Family history of diabetes mellitus: Secondary | ICD-10-CM | POA: Diagnosis not present

## 2022-07-02 DIAGNOSIS — Z8261 Family history of arthritis: Secondary | ICD-10-CM | POA: Diagnosis not present

## 2022-07-02 DIAGNOSIS — M25751 Osteophyte, right hip: Secondary | ICD-10-CM | POA: Diagnosis present

## 2022-07-02 DIAGNOSIS — M25551 Pain in right hip: Secondary | ICD-10-CM | POA: Diagnosis present

## 2022-07-02 DIAGNOSIS — Z751 Person awaiting admission to adequate facility elsewhere: Secondary | ICD-10-CM | POA: Diagnosis not present

## 2022-07-02 DIAGNOSIS — Z8249 Family history of ischemic heart disease and other diseases of the circulatory system: Secondary | ICD-10-CM | POA: Diagnosis not present

## 2022-07-02 MED ORDER — OXYCODONE HCL 5 MG PO TABS: 5.0000 mg | ORAL_TABLET | ORAL | 0 refills | Status: DC | PRN

## 2022-07-02 MED ORDER — METHOCARBAMOL 500 MG PO TABS: 500.0000 mg | ORAL_TABLET | Freq: Four times a day (QID) | ORAL | 0 refills | Status: DC | PRN

## 2022-07-02 MED ORDER — ASPIRIN 81 MG PO CHEW: 81.0000 mg | CHEWABLE_TABLET | Freq: Two times a day (BID) | ORAL | 0 refills | Status: AC

## 2022-07-02 NOTE — Plan of Care (Signed)
  Problem: Clinical Measurements: Goal: Postoperative complications will be avoided or minimized Outcome: Progressing   Problem: Pain Management: Goal: Pain level will decrease with appropriate interventions Outcome: Progressing   Problem: Clinical Measurements: Goal: Will remain free from infection Outcome: Progressing

## 2022-07-02 NOTE — Progress Notes (Signed)
Physical Therapy Treatment Patient Details Name: Daniel [name] MRN: [id number] DOB: November 21, 1950 Today's Date: 07/02/2022   History of Present Illness Pt is a 72 y.o. male who presented 06/30/22 for direct anterior approach right THA. PMH: afib, anxiety    PT Comments    Pt received in supine, sleeping but easily awoken and agreeable to therapy session with emphasis on gait, transfer and stair training. Pt performed bed mobility with up to maxA from flat bed without use of rails (per his home set-up) and transfers with up to minA from low height surfaces. Pt mostly Supervision for gait with RW, needs safety cues and progressed to Supervision for curb step practice using RW to simulate home threshold step. Pt able to demo back use of gait belt as leg lifter and to adjust it for PTA to use as belt after pt transferred to L side of bed. Pt continues to benefit from PT services to progress toward functional mobility goals.   Recommendations for follow up therapy are one component of a multi-disciplinary discharge planning process, led by the attending physician.  Recommendations may be updated based on patient status, additional functional criteria and insurance authorization.  Follow Up Recommendations  Follow physician's recommendations for discharge plan and follow up therapies     Assistance Recommended at Discharge PRN  Patient can return home with the following A little help with bathing/dressing/bathroom;Assistance with cooking/housework;Assist for transportation;Help with stairs or ramp for entrance   Equipment Recommendations  Rolling walker (2 wheels)    Recommendations for Other Services OT consult     Precautions / Restrictions Precautions Precautions: Fall Restrictions Weight Bearing Restrictions: Yes RLE Weight Bearing: Weight bearing as tolerated     Mobility  Bed Mobility Overal bed mobility: Needs Assistance Bed Mobility: Supine to Sit     Supine to sit: Max assist      General bed mobility comments: MaxA from flat bed without rails toward his L side. Pt attempted without assist but was unable to elevate trunk without maxA and needed mod/maxA via bed pad to scoot hips forward to foot flat. Pt with posterior LOB while attempting to scoot unassisted.    Transfers Overall transfer level: Needs assistance Equipment used: Rolling walker (2 wheels) Transfers: Sit to/from Stand Sit to Stand: Min guard, Min assist           General transfer comment: min guard from elevated surface, minA to lowest height, cues needed for foot placement to decrease his pain    Ambulation/Gait Ambulation/Gait assistance: Supervision Gait Distance (Feet): 150 Feet (including x3 standing breaks) Assistive device: Rolling walker (2 wheels) Gait Pattern/deviations: Step-to pattern, Decreased stance time - right, Trunk flexed, Antalgic Gait velocity: grossly <0.3 m/s     General Gait Details: Pt required cues for closer proximity to RW and upright posture frequently, gait pattern very antalgic. No acute s/sx distress   Stairs Stairs: Yes Stairs assistance: Supervision, Min guard Stair Management: No rails, Step to pattern, Forwards, With walker Number of Stairs: 2 General stair comments: 4" curb step in room x2 trials   Wheelchair Mobility    Modified Rankin (Stroke Patients Only)       Balance Overall balance assessment: Needs assistance Sitting-balance support: No upper extremity supported, Feet supported Sitting balance-Leahy Scale: Fair Sitting balance - Comments: sitting in recliner   Standing balance support: Bilateral upper extremity supported, During functional activity Standing balance-Leahy Scale: Fair Standing balance comment: with RW support  Cognition Arousal/Alertness: Awake/alert Behavior During Therapy: WFL for tasks assessed/performed Overall Cognitive Status: Within Functional Limits for tasks  assessed                                 General Comments: Mildly impulsive, pt attempting curb step in room despite PTA telling him to wait while she obtained ice for him. Otherwise WFL, good awareness of deficits.        Exercises Total Joint Exercises Ankle Circles/Pumps: AROM, Both, 10 reps, Supine Heel Slides: AAROM, Right, 10 reps, Supine Other Exercises Other Exercises: standing RLE AROM: hamstring curls x5 reps ea    General Comments General comments (skin integrity, edema, etc.): VSS per chart review, no acute s/sx distress or dizziness reported with functional mobility tasks      Pertinent Vitals/Pain Pain Assessment Pain Assessment: Faces Faces Pain Scale: Hurts even more Pain Location: R hip, esp with bed mobility Pain Descriptors / Indicators: Discomfort, Grimacing, Operative site guarding Pain Intervention(s): Monitored during session, Repositioned, Premedicated before session, Ice applied     PT Goals (current goals can now be found in the care plan section) Acute Rehab PT Goals Patient Stated Goal: to be independent, less pain PT Goal Formulation: With patient Time For Goal Achievement: 07/14/22 Progress towards PT goals: Progressing toward goals    Frequency    7X/week      PT Plan Current plan remains appropriate       AM-PAC PT "6 Clicks" Mobility   Outcome Measure  Help needed turning from your back to your side while in a flat bed without using bedrails?: A Lot Help needed moving from lying on your back to sitting on the side of a flat bed without using bedrails?: A Lot Help needed moving to and from a bed to a chair (including a wheelchair)?: A Little Help needed standing up from a chair using your arms (e.g., wheelchair or bedside chair)?: A Little Help needed to walk in hospital room?: A Little Help needed climbing 3-5 steps with a railing? : A Little 6 Click Score: 16    End of Session Equipment Utilized During  Treatment: Gait belt Activity Tolerance: Patient tolerated treatment well;Patient limited by pain Patient left: in chair;with call bell/phone within reach;with chair alarm set;Other (comment) (iceman donned to his R hip) Nurse Communication: Mobility status PT Visit Diagnosis: Unsteadiness on feet (R26.81);Other abnormalities of gait and mobility (R26.89);Muscle weakness (generalized) (M62.81);Difficulty in walking, not elsewhere classified (R26.2);Pain Pain - Right/Left: Right Pain - part of body: Hip     Time: [id number] PT Time Calculation (min) (ACUTE ONLY): 42 min  Charges:  $Gait Training: 23-37 mins $Therapeutic Activity: 8-22 mins                     Chontel Warning P., PTA Acute Rehabilitation Services Secure Chat Preferred 9a-5:30pm Office: [place] 07/02/2022, 12:11 PM

## 2022-07-02 NOTE — Progress Notes (Signed)
Patient ID: Daniel [name], male   DOB: 17-Feb-1951, 72 y.o.   MRN: [id number] The patient is now postoperative day 2 status post a right total hip arthroplasty to treat significant osteoarthritis of his right hip.  He is Scientist, clinical (histocompatibility and immunogenetics).  He lives completely alone and has no assistance to help him in his home at all.  With that being said short-term skilled nursing has been recommended.  The patient is currently admitted under observation.  Today is Saturday.  He has been seen by social work and an T[place] 2 has been completed.  The patient reports that, per social work, his options are limited since he is under "observation".  Obviously, the patient will be here until at least Monday.  From my standpoint, I can change him to an inpatient admission and we will go from there.  However, I will still leave this up to utilization review because there are times when I put in the order for inpatient admission and it triggers some type of warning that I cannot do that and then the next day once the patient stays even another day I am asked to make the switch to the patient being an inpatient instead of observation.  Given the confusing nature of this, I will leave this up to utilization as needed but they have my permission is a voice order to change this patient to inpatient status when they feel that that is appropriate.  Again, from my standpoint he will be here until Monday while we are waiting skilled nursing placement.

## 2022-07-02 NOTE — Progress Notes (Signed)
Physical Therapy Treatment Patient Details Name: Daniel [name] MRN: [id number] DOB: 09-Jul-1950 Today's Date: 07/02/2022   History of Present Illness Pt is a 72 y.o. male who presented 06/30/22 for direct anterior approach right THA. PMH: afib, anxiety    PT Comments    Pt received in recliner, agreeable to therapy session and requesting additional session to work on [employer] training as nursing staff busy at that time and pt needing assistance to bathroom/for safety with transfers. Pt needing up to modA to stand from lower couch/bench height surface without arm rests and minA/min guard for transfers to elevated chair height with use of arm rests. Pt performed household distance gait trials x2 with seated rest break due to pain/fatigue in between and pt needing safety cues for RW proximity and improved step sequencing for energy conservation. Pt continues to benefit from PT services to progress toward functional mobility goals.    Recommendations for follow up therapy are one component of a multi-disciplinary discharge planning process, led by the attending physician.  Recommendations may be updated based on patient status, additional functional criteria and insurance authorization.  Follow Up Recommendations  Follow physician's recommendations for discharge plan and follow up therapies     Assistance Recommended at Discharge PRN  Patient can return home with the following A little help with bathing/dressing/bathroom;Assistance with cooking/housework;Assist for transportation;Help with stairs or ramp for entrance   Equipment Recommendations  Rolling walker (2 wheels) (he may benefit from adding a detachable rail to his bed frame for ease of bed mobility)    Recommendations for Other Services OT consult     Precautions / Restrictions Precautions Precautions: Fall Restrictions Weight Bearing Restrictions: Yes RLE Weight Bearing: Weight bearing as tolerated     Mobility  Bed  Mobility Overal bed mobility: Needs Assistance Bed Mobility: Supine to Sit     Supine to sit: Max assist     General bed mobility comments: pt defers, wanting to sit up in chair until after dinner time.    Transfers Overall transfer level: Needs assistance Equipment used: Rolling walker (2 wheels) Transfers: Sit to/from Stand Sit to Stand: Min assist, Mod assist           General transfer comment: min guard to stand from elevated chair (pillow on seat to raise height), minA for stand>sit to lower bench surface (to simulate pt sitting on couch/bench) and modA for sit>stand from lower bench height without arm rests.    Ambulation/Gait Ambulation/Gait assistance: Supervision Gait Distance (Feet): 110 Feet (x2 with ~4-5 minute seated break on bench) Assistive device: Rolling walker (2 wheels) Gait Pattern/deviations: Step-to pattern, Decreased stance time - right, Trunk flexed, Antalgic Gait velocity: grossly <0.3 m/s     General Gait Details: Pt required cues for closer proximity to RW and upright posture frequently, gait pattern very antalgic. No acute s/sx distress other than moderate pain.   Stairs Stairs: Yes Stairs assistance: Supervision, Min guard Stair Management: No rails, Step to pattern, Forwards, With walker Number of Stairs: 2 General stair comments: 4" curb step in room x2 trials   Wheelchair Mobility    Modified Rankin (Stroke Patients Only)       Balance Overall balance assessment: Needs assistance Sitting-balance support: No upper extremity supported, Feet supported Sitting balance-Leahy Scale: Fair Sitting balance - Comments: EOB initially poor progressing to fair after ~2 mins during AM session once pt scooted far enough forward but pt needed 1-2 UE support on bed surface; bench and recliner surfaces pt with fair  balance with single UE support   Standing balance support: Bilateral upper extremity supported, During functional activity Standing  balance-Leahy Scale: Fair Standing balance comment: with RW support                            Cognition Arousal/Alertness: Awake/alert Behavior During Therapy: WFL for tasks assessed/performed Overall Cognitive Status: Within Functional Limits for tasks assessed                                 General Comments: Pt following instructions well in PM session, chair alarm on pre/post session for pt safety given difficulty with transfers/bed mobility when unassisted and moderate fall risk. Pt educated about why therapist using this alarm.        Exercises Total Joint Exercises Ankle Circles/Pumps: AROM, Both, 10 reps, Supine Heel Slides: AAROM, Right, Supine, 5 reps Hip ABduction/ADduction: AAROM, Right, 5 reps, Supine Other Exercises Other Exercises: IS x 5 reps ~2,000 mL    General Comments General comments (skin integrity, edema, etc.): No acute s/sx distress other than moderate pain with gait/transfers moderate to severe pain; pt reports ice is helping to decrease his pain      Pertinent Vitals/Pain Pain Assessment Pain Assessment: Faces Faces Pain Scale: Hurts even more Pain Location: R hip, esp with bed mobility Pain Descriptors / Indicators: Discomfort, Grimacing, Operative site guarding Pain Intervention(s): Limited activity within patient's tolerance, Monitored during session, Repositioned, Ice applied     PT Goals (current goals can now be found in the care plan section) Acute Rehab PT Goals Patient Stated Goal: to be independent, less pain PT Goal Formulation: With patient Time For Goal Achievement: 07/14/22 Progress towards PT goals: Progressing toward goals    Frequency    7X/week      PT Plan Current plan remains appropriate       AM-PAC PT "6 Clicks" Mobility   Outcome Measure  Help needed turning from your back to your side while in a flat bed without using bedrails?: A Lot Help needed moving from lying on your back to  sitting on the side of a flat bed without using bedrails?: A Lot Help needed moving to and from a bed to a chair (including a wheelchair)?: A Little Help needed standing up from a chair using your arms (e.g., wheelchair or bedside chair)?: A Lot (with arm rests a little, without arm rests "a lot" from lower chair height) Help needed to walk in hospital room?: A Little Help needed climbing 3-5 steps with a railing? : A Little 6 Click Score: 15    End of Session Equipment Utilized During Treatment: Gait belt Activity Tolerance: Patient tolerated treatment well;Patient limited by pain Patient left: in chair;with call bell/phone within reach;with chair alarm set;Other (comment) (iceman donned to his R hip) Nurse Communication: Mobility status PT Visit Diagnosis: Unsteadiness on feet (R26.81);Other abnormalities of gait and mobility (R26.89);Muscle weakness (generalized) (M62.81);Difficulty in walking, not elsewhere classified (R26.2);Pain Pain - Right/Left: Right Pain - part of body: Hip     Time: 1410-1444 PT Time Calculation (min) (ACUTE ONLY): 34 min  Charges:  $Gait Training: 8-22 mins $Therapeutic Activity: 8-22 mins                     Kemuel Buchmann P., PTA Acute Rehabilitation Services Secure Chat Preferred 9a-5:30pm Office: [place] 07/02/2022, 2:57  PM

## 2022-07-02 NOTE — Plan of Care (Signed)
  Problem: Clinical Measurements: Goal: Postoperative complications will be avoided or minimized Outcome: Progressing   Problem: Activity: Goal: Ability to tolerate increased activity will improve Outcome: Progressing   Problem: Activity: Goal: Ability to avoid complications of mobility impairment will improve Outcome: Progressing

## 2022-07-03 NOTE — Progress Notes (Signed)
Physical Therapy Treatment Patient Details Name: Daniel [name] MRN: [id number] DOB: [birth year] Today's Date: 07/03/2022   History of Present Illness Pt is a 72 y.o. male who presented 06/30/22 for direct anterior approach right THA. PMH: afib, anxiety    PT Comments    Patient progressing well towards PT goals. Pt had just finished taking a shower prior to PT arrival but still eager to work with therapy. Session focused on gait training, transfers and there ex. Requires Min guard to stand from recliner with arm rests but difficulty with uncontrolled descent into chair. Tolerated there ex of RLE in seated position. Continues to require rest breaks during ambulation due to pain, fatigue and weakness. Motivated to participate in therapy. Reports trouble with bed mobility so will continue to work on this next session. Will continue to follow.    Recommendations for follow up therapy are one component of a multi-disciplinary discharge planning process, led by the attending physician.  Recommendations may be updated based on patient status, additional functional criteria and insurance authorization.  Follow Up Recommendations  Follow physician's recommendations for discharge plan and follow up therapies     Assistance Recommended at Discharge PRN  Patient can return home with the following A little help with bathing/dressing/bathroom;Assistance with cooking/housework;Assist for transportation;Help with stairs or ramp for entrance   Equipment Recommendations  Rolling walker (2 wheels)    Recommendations for Other Services       Precautions / Restrictions Precautions Precautions: Fall Restrictions Weight Bearing Restrictions: Yes RLE Weight Bearing: Weight bearing as tolerated Other Position/Activity Restrictions: no hip precautions orders (case manager noted that his surgeon does not want his THA pts performing hip abduction exercises)     Mobility  Bed Mobility                General bed mobility comments: Standing at doorway upon PT Arrival.    Transfers Overall transfer level: Needs assistance Equipment used: Rolling walker (2 wheels) Transfers: Sit to/from Stand Sit to Stand: Min guard           General transfer comment: Min guard to stand from chair x1, some difficulty and uncontrolled descent into chair on both occasions. Good demo of hand placement.    Ambulation/Gait Ambulation/Gait assistance: Supervision Gait Distance (Feet): 120 Feet (x2 with a few rest breasks) Assistive device: Rolling walker (2 wheels) Gait Pattern/deviations: Step-to pattern, Decreased stance time - right, Trunk flexed, Antalgic Gait velocity: decreased Gait velocity interpretation: <1.8 ft/sec, indicate of risk for recurrent falls   General Gait Details: Slow antalgic like gait with a few standing rest breaks due to fatigue/pain.   Stairs             Wheelchair Mobility    Modified Rankin (Stroke Patients Only)       Balance Overall balance assessment: Needs assistance Sitting-balance support: Feet supported, No upper extremity supported Sitting balance-Leahy Scale: Good     Standing balance support: During functional activity, Bilateral upper extremity supported Standing balance-Leahy Scale: Poor Standing balance comment: with RW support                            Cognition Arousal/Alertness: Awake/alert Behavior During Therapy: WFL for tasks assessed/performed Overall Cognitive Status: Within Functional Limits for tasks assessed  Exercises Total Joint Exercises Ankle Circles/Pumps: AROM, Both, 10 reps, Seated Quad Sets: AROM, Seated, Both, 10 reps Towel Squeeze: AROM, Both, 10 reps, Seated Hip ABduction/ADduction: AAROM, Right, Seated, 5 reps Long Arc Quad: AROM, Seated, Right, 5 reps Other Exercises Other Exercises: modified heel raises x20 in seated position     General Comments General comments (skin integrity, edema, etc.): VSS on RA.      Pertinent Vitals/Pain Pain Assessment Pain Assessment: 0-10 Pain Score: 8  Pain Location: R hip Pain Descriptors / Indicators: Sore, Operative site guarding Pain Intervention(s): Monitored during session, Repositioned, Patient requesting pain meds-RN notified, Limited activity within patient's tolerance    Home Living                          Prior Function            PT Goals (current goals can now be found in the care plan section) Progress towards PT goals: Progressing toward goals    Frequency    7X/week      PT Plan Current plan remains appropriate    Co-evaluation              AM-PAC PT "6 Clicks" Mobility   Outcome Measure  Help needed turning from your back to your side while in a flat bed without using bedrails?: A Little Help needed moving from lying on your back to sitting on the side of a flat bed without using bedrails?: A Lot Help needed moving to and from a bed to a chair (including a wheelchair)?: A Little Help needed standing up from a chair using your arms (e.g., wheelchair or bedside chair)?: A Little Help needed to walk in hospital room?: A Little Help needed climbing 3-5 steps with a railing? : A Little 6 Click Score: 17    End of Session Equipment Utilized During Treatment: Gait belt Activity Tolerance: Patient tolerated treatment well Patient left: in chair;with call bell/phone within reach Nurse Communication: Mobility status;Patient requests pain meds PT Visit Diagnosis: Unsteadiness on feet (R26.81);Other abnormalities of gait and mobility (R26.89);Muscle weakness (generalized) (M62.81);Difficulty in walking, not elsewhere classified (R26.2);Pain Pain - Right/Left: Right Pain - part of body: Hip     Time: 1210-1234 PT Time Calculation (min) (ACUTE ONLY): 24 min  Charges:  $Gait Training: 8-22 mins $Therapeutic Exercise: 8-22 mins                      Marisa Severin, PT, DPT Acute Rehabilitation Services Secure chat preferred Office [place] 07/03/2022, 3:05 PM

## 2022-07-03 NOTE — Progress Notes (Signed)
Occupational Therapy Treatment Patient Details Name: Daniel [name] MRN: [id number] DOB: [birth year] Today's Date: 07/03/2022   History of present illness Pt is a 72 y.o. male who presented 06/30/22 for direct anterior approach right THA. PMH: afib, anxiety   OT comments  Pt. Seen for skilled OT treatment session. Further review and education on A/E for LB dressing. Pt. Able to return demo of use min guard a.  Will only need reacher and a LH loofa sponge per his report and will have niece assist him with online purchase.  Plan to have pt. Complete ub/lb b/d next session with use of a/e prn.    Recommendations for follow up therapy are one component of a multi-disciplinary discharge planning process, led by the attending physician.  Recommendations may be updated based on patient status, additional functional criteria and insurance authorization.    Follow Up Recommendations  Follow physician's recommendations for discharge plan and follow up therapies     Assistance Recommended at Discharge Intermittent Supervision/Assistance  Patient can return home with the following  A little help with walking and/or transfers;A little help with bathing/dressing/bathroom;Assistance with cooking/housework;Assist for transportation;Help with stairs or ramp for entrance   Equipment Recommendations  None recommended by OT    Recommendations for Other Services      Precautions / Restrictions Precautions Precautions: Fall Restrictions Weight Bearing Restrictions: Yes RLE Weight Bearing: Weight bearing as tolerated Other Position/Activity Restrictions: no hip precautions orders (case manager noted that his surgeon does not want his THA pts performing hip abduction exercises)       Mobility Bed Mobility               General bed mobility comments: in chair beginning and end of session    Transfers Overall transfer level: Needs assistance Equipment used: Rolling walker (2 wheels) Transfers:  Sit to/from Stand, Bed to chair/wheelchair/BSC Sit to Stand: Min guard Stand pivot transfers: Min guard         General transfer comment: in room ambulation as pt. wanting a lot of ambulation each day. walked back and forth the length of the room x2. no lob, slow gait with step,step then move the rw gait pattern vs. normal step pattern     Balance                                           ADL either performed or assessed with clinical judgement   ADL Overall ADL's : Needs assistance/impaired                     Lower Body Dressing: Min guard;With adaptive equipment;Sitting/lateral leans Lower Body Dressing Details (indicate cue type and reason): Pt ed and demonstrating with AE with min guard A. Educating regarding where AE can be found               General ADL Comments: not interested in [facility] shoe horn or LH sponge wants a LH loofa. thinks a neighbor has a sock aide for him to borrow. interested in a reacher, will have his niece look on [place]  max encouragment to appreciate his progress and abilities.  some negative comments about his progess as he is discouraged he is having trouble getting RLE in/out of bed.  reviewed he is doing well and getting better each day.  has very high standards and goals for himself. wants to walk  4x a day ect.    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  Reports he lives alone and will need to complete all tasks at home by himself.    Pertinent Vitals/ Pain       Pain Assessment Pain Assessment: No/denies  pain  Home Living                                          Prior Functioning/Environment              Frequency  Min 2X/week        Progress Toward Goals  OT Goals(current goals can now be found in the care plan section)  Progress towards OT goals: Progressing toward goals     Plan Discharge plan remains appropriate    Co-evaluation                 AM-PAC OT "6 Clicks" Daily Activity     Outcome Measure   Help from another person eating meals?: None Help from another person taking care of personal grooming?: A Little Help from another person toileting, which includes using toliet, bedpan, or urinal?: A Little Help from another person bathing (including washing, rinsing, drying)?: A Little Help from another person to put on and taking off regular upper body clothing?: A Little Help from another person to put on and taking off regular lower body clothing?: A Little 6 Click Score: 19    End of Session Equipment Utilized During Treatment: Gait belt;Rolling walker (2 wheels);Other (comment)  OT Visit Diagnosis: Unsteadiness on feet (R26.81);Muscle weakness (generalized) (M62.81);Other abnormalities of gait and mobility (R26.89);Pain Pain - Right/Left: Right Pain - part of body: Hip   Activity Tolerance Patient tolerated treatment well   Patient Left in chair;with chair alarm set;with call bell/phone within reach   Nurse Communication          Time: [id number] OT Time Calculation (min): 27 min  Charges: OT General Charges $OT Visit: 1 Visit OT Treatments $Self Care/Home Management : 23-37 mins  Sonia Baller, COTA/L Acute Rehabilitation [phone]   Clearnce Sorrel Lorraine-COTA/L 07/03/2022, 12:32 PM

## 2022-07-03 NOTE — Progress Notes (Signed)
Subjective: 3 Days Post-Op Procedure(s) (LRB): RIGHT TOTAL HIP ARTHROPLASTY ANTERIOR APPROACH (Right) Patient reports pain as moderate.  Slow progress with PT. Lives alone.   Objective: Vital signs in last 24 hours: Temp:  [98.2 F (36.8 C)-98.7 F (37.1 C)] 98.7 F (37.1 C) (02/11 0741) Pulse Rate:  [76-97] 77 (02/11 0741) Resp:  [16-18] 16 (02/11 0650) BP: (129-143)/(69-84) 137/81 (02/11 0741) SpO2:  [94 %-98 %] 94 % (02/11 0741)  Intake/Output from previous day: No intake/output data recorded. Intake/Output this shift: No intake/output data recorded.  Recent Labs    07/01/22 0312  HGB 13.1   Recent Labs    07/01/22 0312  WBC 11.0*  RBC 4.22  HCT 39.3  PLT 223   Recent Labs    07/01/22 0312  NA 134*  K 4.3  CL 100  CO2 26  BUN 10  CREATININE 0.75  GLUCOSE 143*  CALCIUM 8.8*   No results for input(s): "LABPT", "INR" in the last 72 hours.  Right lower extremity: Dorsiflexion/Plantar flexion intact Incision: dressing C/D/I Compartment soft   Assessment/Plan: 3 Days Post-Op Procedure(s) (LRB): RIGHT TOTAL HIP ARTHROPLASTY ANTERIOR APPROACH (Right) Up with therapy Awaiting SNF placement     Shebra Muldrow 07/03/2022, 9:45 AM

## 2022-07-04 NOTE — Progress Notes (Signed)
Patient ID: Daniel [name], male   DOB: Dec 12, 1950, 72 y.o.   MRN: [id number] The patient's vital signs are stable and his right operative hip is stable.  If the appropriate short-term skilled nursing can be found today and made available, he can be discharged to short-term skilled nursing today.  I will also involve [name] who is our case Freight forwarder.

## 2022-07-04 NOTE — Progress Notes (Signed)
Physical Therapy Treatment Patient Details Name: Daniel [name] MRN: [id number] DOB: 1950-09-27 Today's Date: 07/04/2022   History of Present Illness Pt is a 72 y.o. male who presented 06/30/22 for direct anterior approach right THA. PMH: afib, anxiety    PT Comments    Pt was received sitting in recliner and agreeable to therapy. Pt participated in bed mobility trials with emphasis on RLE management. Pt with increased independence utilizing gait belt to raise RLE to bed. Pt was able to perform sit to supine without use of bed rails with increased time, effort, and verbal cues for technique. Pt continues to be limited by pain and fatigue with mobility tasks. Pt demonstrated improved eccentric control this session, however continues to lose control at end of descent due to pain. Pt continues to benefit from PT services to progress toward functional mobility goals.     Recommendations for follow up therapy are one component of a multi-disciplinary discharge planning process, led by the attending physician.  Recommendations may be updated based on patient status, additional functional criteria and insurance authorization.  Follow Up Recommendations  Follow physician's recommendations for discharge plan and follow up therapies     Assistance Recommended at Discharge PRN  Patient can return home with the following A little help with bathing/dressing/bathroom;Assistance with cooking/housework;Assist for transportation;Help with stairs or ramp for entrance   Equipment Recommendations  Rolling walker (2 wheels)    Recommendations for Other Services       Precautions / Restrictions Precautions Precautions: Fall Restrictions Weight Bearing Restrictions: Yes RLE Weight Bearing: Weight bearing as tolerated Other Position/Activity Restrictions: no hip precautions orders (case manager noted that his surgeon does not want his THA pts performing hip abduction exercises)     Mobility  Bed  Mobility Overal bed mobility: Needs Assistance Bed Mobility: Supine to Sit, Sit to Supine     Supine to sit: Min guard Sit to supine: Min assist, Min guard   General bed mobility comments: Pt able to complete 2 trials for improved RLE management with verbal cues for technique and sequence. Pt used LLE to raise RLE to bed on first trial with increased difficulty and requiring min A to raise RLE due to pain. Pt used gait belt to raise RLE on second trial progressing to min guard. Pt able to sit EOB on second trial without use of bed rail requiring increased time and effort.    Transfers Overall transfer level: Needs assistance Equipment used: Rolling walker (2 wheels) Transfers: Sit to/from Stand Sit to Stand: Min guard           General transfer comment: From recliner to RW x1 and lowered EOB to RW x1. Pt required verbal cues for RLE placement to decrease pain. Pt demonstrating improved eccentric control when sitting to recliner and EOB.    Ambulation/Gait Ambulation/Gait assistance: Supervision Gait Distance (Feet): 165 Feet Assistive device: Rolling walker (2 wheels) Gait Pattern/deviations: Step-through pattern, Trunk flexed, Antalgic, Step-to pattern       General Gait Details: Slow step-to pattern progressing to step-through pattern with heavy reliance on RW to decrease RLE WB.        Balance Overall balance assessment: Needs assistance Sitting-balance support: Feet supported, No upper extremity supported Sitting balance-Leahy Scale: Good Sitting balance - Comments: at EOB   Standing balance support: During functional activity, Bilateral upper extremity supported Standing balance-Leahy Scale: Fair Standing balance comment: with RW support  Cognition Arousal/Alertness: Awake/alert Behavior During Therapy: WFL for tasks assessed/performed Overall Cognitive Status: Within Functional Limits for tasks assessed                                           Exercises      General Comments General comments (skin integrity, edema, etc.): VSS      Pertinent Vitals/Pain Pain Assessment Pain Assessment: 0-10 Pain Score: 6  Pain Location: R hip Pain Descriptors / Indicators: Sore, Operative site guarding Pain Intervention(s): Limited activity within patient's tolerance, Monitored during session, Repositioned, Ice applied     PT Goals (current goals can now be found in the care plan section) Acute Rehab PT Goals Patient Stated Goal: to be independent, less pain PT Goal Formulation: With patient Time For Goal Achievement: 07/14/22 Potential to Achieve Goals: Good Progress towards PT goals: Progressing toward goals    Frequency    7X/week      PT Plan Current plan remains appropriate       AM-PAC PT "6 Clicks" Mobility   Outcome Measure  Help needed turning from your back to your side while in a flat bed without using bedrails?: A Little Help needed moving from lying on your back to sitting on the side of a flat bed without using bedrails?: A Little Help needed moving to and from a bed to a chair (including a wheelchair)?: A Little Help needed standing up from a chair using your arms (e.g., wheelchair or bedside chair)?: A Little Help needed to walk in hospital room?: A Little Help needed climbing 3-5 steps with a railing? : A Little 6 Click Score: 18    End of Session Equipment Utilized During Treatment: Gait belt Activity Tolerance: Patient tolerated treatment well Patient left: in chair;with call bell/phone within reach Nurse Communication: Mobility status PT Visit Diagnosis: Unsteadiness on feet (R26.81);Other abnormalities of gait and mobility (R26.89);Muscle weakness (generalized) (M62.81);Difficulty in walking, not elsewhere classified (R26.2);Pain Pain - Right/Left: Right Pain - part of body: Hip     Time: [id number] PT Time Calculation (min) (ACUTE ONLY): 25  min  Charges:  $Gait Training: 8-22 mins $Therapeutic Activity: 8-22 mins                     Michelle Nasuti, PTA Acute Rehabilitation Services Secure Chat Preferred  [phone]    Michelle Nasuti 07/04/2022, 9:25 AM

## 2022-07-04 NOTE — Discharge Summary (Signed)
Patient ID: Daniel [name] MRN: [id number] DOB/AGE: 08-18-1950 72 y.o.  Admit date: 06/30/2022 Discharge date: 07/04/2022  Admission Diagnoses:  Principal Problem:   Unilateral primary osteoarthritis, right hip Active Problems:   Status post total replacement of right hip   Discharge Diagnoses:  Same  Past Medical History:  Diagnosis Date   Acne    Anxiety    DDD (degenerative disc disease), lumbosacral    chronic back pain   Diverticulosis of colon (without mention of hemorrhage)    colo 09/2010   History of atrial fibrillation 10/30/2016   Low back pain radiating to both legs     Surgeries: Procedure(s): RIGHT TOTAL HIP ARTHROPLASTY ANTERIOR APPROACH on 06/30/2022   Consultants:   Discharged Condition: Improved  Hospital Course: Daniel [name] is an 72 y.o. male who was admitted 06/30/2022 for operative treatment ofUnilateral primary osteoarthritis, right hip. Patient has severe unremitting pain that affects sleep, daily activities, and work/hobbies. After pre-op clearance the patient was taken to the operating room on 06/30/2022 and underwent  Procedure(s): RIGHT TOTAL HIP ARTHROPLASTY ANTERIOR APPROACH.    Patient was given perioperative antibiotics:  Anti-infectives (From admission, onward)    Start     Dose/Rate Route Frequency Ordered Stop   07/01/22 1000  doxycycline (VIBRAMYCIN) 50 MG capsule 50 mg       Note to Pharmacy: May take an additional 50 mg capsule as needed for rosacea flare up.     50 mg Oral Daily 06/30/22 1038     06/30/22 1400  ceFAZolin (ANCEF) IVPB 1 g/50 mL premix        1 g 100 mL/hr over 30 Minutes Intravenous Every 6 hours 06/30/22 1038 06/30/22 2115   06/30/22 0600  ceFAZolin (ANCEF) IVPB 2g/100 mL premix        2 g 200 mL/hr over 30 Minutes Intravenous On call to O.R. 06/30/22 [id number] 06/30/22 0743        Patient was given sequential compression devices, early ambulation, and chemoprophylaxis to prevent DVT.  Patient benefited maximally from  hospital stay and there were no complications.    Recent vital signs: Patient Vitals for the past 24 hrs:  BP Temp Temp src Pulse Resp SpO2  07/04/22 0553 119/81 98.1 F (36.7 C) Oral 81 15 96 %  07/03/22 2021 124/80 100.1 F (37.8 C) Oral 89 16 99 %     Recent laboratory studies: No results for input(s): "WBC", "HGB", "HCT", "PLT", "NA", "K", "CL", "CO2", "BUN", "CREATININE", "GLUCOSE", "INR", "CALCIUM" in the last 72 hours.  Invalid input(s): "PT", "2"   Discharge Medications:   Allergies as of 07/04/2022       Reactions   Durezol [difluprednate] Swelling   These eye drops cause swelling        Medication List     STOP taking these medications    traMADol 50 MG tablet Commonly known as: ULTRAM       TAKE these medications    acetaminophen 500 MG tablet Commonly known as: TYLENOL Take 500-1,000 mg by mouth 2 (two) times daily as needed for mild pain.   amitriptyline 25 MG tablet Commonly known as: ELAVIL Take 1 tablet (25 mg total) by mouth at bedtime as needed for sleep. What changed: when to take this   aspirin 81 MG chewable tablet Chew 1 tablet (81 mg total) by mouth 2 (two) times daily.   cyanocobalamin 1000 MCG tablet Commonly known as: VITAMIN B12 Take 1,000 mcg by mouth daily.   doxycycline 50 MG  capsule Commonly known as: MONODOX Take 50 mg by mouth daily. May take an additional 50 mg capsule as needed for rosacea flare up.   ezetimibe 10 MG tablet Commonly known as: ZETIA Take 10 mg by mouth daily.   hydrOXYzine 10 MG tablet Commonly known as: ATARAX TAKE 1 TABLET BY MOUTH THREE TIMES A DAY AS NEEDED   methocarbamol 500 MG tablet Commonly known as: ROBAXIN Take 1 tablet (500 mg total) by mouth every 6 (six) hours as needed for muscle spasms. What changed: reasons to take this   MULTIVITAMIN PO Take 1 tablet by mouth at bedtime.   oxyCODONE 5 MG immediate release tablet Commonly known as: Oxy IR/ROXICODONE Take 1-2 tablets (5-10 mg  total) by mouth every 4 (four) hours as needed for moderate pain (pain score 4-6).   PROSACEA EX Apply 1 Application topically as needed (rosacea).   Vitamin D-1000 Max St 25 MCG (1000 UT) tablet Generic drug: Cholecalciferol Take 1,000 Units by mouth daily.        Diagnostic Studies: DG Pelvis Portable  Result Date: 06/30/2022 CLINICAL DATA:  Status post right total hip arthroplasty. EXAM: PORTABLE PELVIS 1 VIEWS; DG C-ARM 1-60 MIN-NO REPORT COMPARISON:  None Available. FINDINGS: There is no evidence of pelvic fracture or diastasis. No pelvic bone lesions are seen. Right total hip arthroplasty noted. Dose: 1.3 mGy Fluoro time 19s IMPRESSION: 1. C-arm fluoro guidance provided. 2. Unremarkable single AP projection status post right total hip arthroplasty. Electronically Signed   By: Sammie Bench M.D.   On: 06/30/2022 09:37   DG HIP UNILAT WITH PELVIS 2-3 VIEWS RIGHT  Result Date: 06/30/2022 CLINICAL DATA:  Intraoperative imaging for right hip replacement. EXAM: DG HIP (WITH OR WITHOUT PELVIS) 2-3V RIGHT COMPARISON:  Plain films right hip 01/19/2022. FINDINGS: Two fluoroscopic spot views of the right hip demonstrate arthroplasty in place. The device is located. No fracture or other acute abnormality is seen. IMPRESSION: Intraoperative imaging for right hip replacement.  No acute finding. Electronically Signed   By: Inge Rise M.D.   On: 06/30/2022 08:53   DG C-Arm 1-60 Min-No Report  Result Date: 06/30/2022 Fluoroscopy was utilized by the requesting physician.  No radiographic interpretation.    Disposition: Discharge disposition: [facility]     Bernerd Limbo, MD Follow up.   Specialty: Family Medicine Contact information: [address] [place] [address] [phone]         [facility] Follow up.   Specialty: [place] Why: home health PT services will be provided  [place] information: [address] [place] [address] [phone]         Mcarthur Rossetti, MD Follow up in 2 week(s).   Specialty: Orthopedic Surgery Contact information: [address] [place] [address] [phone]                  Signed: Mcarthur Rossetti 07/04/2022, 7:54 AM

## 2022-07-04 NOTE — Progress Notes (Signed)
Occupational Therapy Treatment Patient Details Name: Daniel [name] MRN: [id number] DOB: 08-14-1950 Today's Date: 07/04/2022   History of present illness Pt is a 72 y.o. male who presented 06/30/22 for direct anterior approach right THA. PMH: afib, anxiety   OT comments  Patient continues to demo good gains with OT treatment. Patient performed bathing in shower with min guard for safety and required assistance for drying feet. Patient demonstrated good understanding of AE use with patient able to donn socks with sock aide with min verbal cues. Patient states he would like to continue with more rehab before returning home due to lives alone. Acute OT to continue to follow to address AE training for LB dressing and self care.    Recommendations for follow up therapy are one component of a multi-disciplinary discharge planning process, led by the attending physician.  Recommendations may be updated based on patient status, additional functional criteria and insurance authorization.    Follow Up Recommendations  Follow physician's recommendations for discharge plan and follow up therapies     Assistance Recommended at Discharge Intermittent Supervision/Assistance  Patient can return home with the following  A little help with walking and/or transfers;A little help with bathing/dressing/bathroom;Assistance with cooking/housework;Assist for transportation;Help with stairs or ramp for entrance   Equipment Recommendations  None recommended by OT    Recommendations for Other Services      Precautions / Restrictions Precautions Precautions: Fall Restrictions Weight Bearing Restrictions: Yes RLE Weight Bearing: Weight bearing as tolerated Other Position/Activity Restrictions: no hip precautions orders (case manager noted that his surgeon does not want his THA pts performing hip abduction exercises)       Mobility Bed Mobility Overal bed mobility: Needs Assistance Bed Mobility: Supine to Sit      Supine to sit: Min guard     General bed mobility comments: increased time due to pain    Transfers Overall transfer level: Needs assistance Equipment used: Rolling walker (2 wheels) Transfers: Sit to/from Stand Sit to Stand: Min guard           General transfer comment: from bed to shower and to recliner with min gaurd for safety, cues for hand placement     Balance Overall balance assessment: Needs assistance Sitting-balance support: Feet supported, No upper extremity supported Sitting balance-Leahy Scale: Good     Standing balance support: During functional activity, Bilateral upper extremity supported, Single extremity supported Standing balance-Leahy Scale: Fair Standing balance comment: used grab bars in shower for support                           ADL either performed or assessed with clinical judgement   ADL Overall ADL's : Needs assistance/impaired     Grooming: Wash/dry hands;Wash/dry face;Oral care;Brushing hair;Supervision/safety;Standing Grooming Details (indicate cue type and reason): at sink Upper Body Bathing: Supervision/ safety;Standing Upper Body Bathing Details (indicate cue type and reason): in shower Lower Body Bathing: Min guard;Sit to/from stand Lower Body Bathing Details (indicate cue type and reason): in shower Upper Body Dressing : Set up;Sitting Upper Body Dressing Details (indicate cue type and reason): change gown Lower Body Dressing: Min guard;With adaptive equipment;Sitting/lateral leans Lower Body Dressing Details (indicate cue type and reason): education on reacher and sock aide for LB dressing with min guard when standing         Tub/ Shower Transfer: Walk-in shower;Shower seat;Ambulation;Min guard     General ADL Comments: Patient performed shower standing in room with min guard  for safety    Extremity/Trunk Assessment              Vision       Perception     Praxis      Cognition  Arousal/Alertness: Awake/alert Behavior During Therapy: WFL for tasks assessed/performed Overall Cognitive Status: Within Functional Limits for tasks assessed                                          Exercises      Shoulder Instructions       General Comments VSS    Pertinent Vitals/ Pain       Pain Assessment Pain Assessment: Faces Faces Pain Scale: Hurts little more Pain Location: R hip Pain Descriptors / Indicators: Sore, Operative site guarding Pain Intervention(s): Limited activity within patient's tolerance, Monitored during session, Repositioned  Home Living                                          Prior Functioning/Environment              Frequency  Min 2X/week        Progress Toward Goals  OT Goals(current goals can now be found in the care plan section)  Progress towards OT goals: Progressing toward goals  Acute Rehab OT Goals Patient Stated Goal: continue with rehab to get strongrer for safe return home OT Goal Formulation: With patient Time For Goal Achievement: 07/15/22 Potential to Achieve Goals: Good ADL Goals Pt Will Perform Lower Body Dressing: with modified independence;sit to/from stand  Plan Discharge plan remains appropriate    Co-evaluation                 AM-PAC OT "6 Clicks" Daily Activity     Outcome Measure   Help from another person eating meals?: None Help from another person taking care of personal grooming?: A Little Help from another person toileting, which includes using toliet, bedpan, or urinal?: A Little Help from another person bathing (including washing, rinsing, drying)?: A Little Help from another person to put on and taking off regular upper body clothing?: A Little Help from another person to put on and taking off regular lower body clothing?: A Little 6 Click Score: 19    End of Session Equipment Utilized During Treatment: Rolling walker (2 wheels)  OT Visit  Diagnosis: Unsteadiness on feet (R26.81);Muscle weakness (generalized) (M62.81);Other abnormalities of gait and mobility (R26.89);Pain Pain - Right/Left: Right Pain - part of body: Hip   Activity Tolerance Patient tolerated treatment well   Patient Left in chair;with chair alarm set;with call bell/phone within reach   Nurse Communication Mobility status        Time: [id number] OT Time Calculation (min): 26 min  Charges: OT General Charges $OT Visit: 1 Visit OT Treatments $Self Care/Home Management : 23-37 mins  Lodema Hong, [place]  Office [place] 07/04/2022, 11:03 AM

## 2022-07-04 NOTE — TOC Progression Note (Addendum)
Transition of Care [facility]) - Progression Note    Patient Details  Name: Daniel [name] MRN: [id number] Date of Birth: January 21, 1951  [facility]) CM/SW Contact  Joanne Chars, LCSW Phone Number: 07/04/2022, 10:16 AM  Clinical Narrative:   Pt has been changed to inpatient status as of 2/10.  CSW messaged Rande Brunt to confirm that pt can still admit to SNF under the SNF waiver today and she confirmed that pt can do this.    CSW spoke with [facility].  They did have bed open up over the weekend and can accept pt.  Will need the medicare waiver, Barnett Applebaum notified and will send it.  CSW spoke with pt about this, he accepts this offer.  Discussed transportation options--pt reports his nephew can transport to [employer].  CSW confirmed with Lyndal Rainbow   Expected Discharge Plan: Skilled Nursing Facility Barriers to Discharge: Continued Medical Work up  Expected Discharge Plan and Services         Expected Discharge Date: 07/04/22                                     Social Determinants of Health (SDOH) Interventions SDOH Screenings   Food Insecurity: No Food Insecurity (06/30/2022)  Housing: Low Risk  (06/30/2022)  Transportation Needs: No Transportation Needs (06/30/2022)  Utilities: Not At Risk (06/30/2022)  Depression (PHQ2-9): Low Risk  (10/28/2019)  Tobacco Use: Low Risk  (06/30/2022)    Readmission Risk Interventions     No data to display

## 2022-07-04 NOTE — TOC Transition Note (Signed)
Transition of Care [facility]) - CM/SW Discharge Note   Patient Details  Name: Daniel [name] MRN: [id number] Date of Birth: July 18, 1950  [facility]) CM/SW Contact:  Joanne Chars, LCSW Phone Number: 07/04/2022, 11:17 AM   Clinical Narrative:   Pt discharging to [facility], room 119.  RN call report to [phone]. (Main # [phone])  Pt nephew Mitzi Hansen will provide transportation.  He will call once he arrives at main entrance and will need assistance getting pt into the vehicle.      Final next level of care: Skilled Nursing Facility Barriers to Discharge: Barriers Resolved   Patient Goals and CMS Choice      Discharge Placement                Patient chooses bed at:  [facility]) Patient to be transferred to facility by: nephew Mitzi Hansen Name of family member notified: nephew andrew Patient and family notified of of transfer: 07/04/22  Discharge Plan and Services Additional resources added to the After Visit Summary for                                       Social Determinants of Health (SDOH) Interventions SDOH Screenings   Food Insecurity: No Food Insecurity (06/30/2022)  Housing: Low Risk  (06/30/2022)  Transportation Needs: No Transportation Needs (06/30/2022)  Utilities: Not At Risk (06/30/2022)  Depression (PHQ2-9): Low Risk  (10/28/2019)  Tobacco Use: Low Risk  (06/30/2022)     Readmission Risk Interventions     No data to display

## 2022-07-06 ENCOUNTER — Other Ambulatory Visit: Payer: Self-pay | Admitting: *Deleted

## 2022-07-06 ENCOUNTER — Telehealth: Payer: Self-pay | Admitting: *Deleted

## 2022-07-06 NOTE — Telephone Encounter (Signed)
Ortho bundle D/C call attempted x 2. Will continue to attempt to reach.

## 2022-07-06 NOTE — Patient Outreach (Signed)
Mr. Ishee admitted to [facility] skilled nursing facility under the [facility] 3-Day [facility] Waiver on 07/04/22.  Mr. Moudry is followed by Ortho Bundle case manager.   Writer will follow for discharged date from SNF.   Marthenia Rolling, MSN, RN,BSN [place] Acute Care Coordinator [phone] (Direct dial)

## 2022-07-14 ENCOUNTER — Telehealth: Payer: Self-pay | Admitting: *Deleted

## 2022-07-14 ENCOUNTER — Ambulatory Visit (INDEPENDENT_AMBULATORY_CARE_PROVIDER_SITE_OTHER): Payer: Medicare Other | Admitting: Orthopaedic Surgery

## 2022-07-14 ENCOUNTER — Encounter: Payer: Self-pay | Admitting: Orthopaedic Surgery

## 2022-07-14 DIAGNOSIS — Z96641 Presence of right artificial hip joint: Secondary | ICD-10-CM

## 2022-07-14 NOTE — Progress Notes (Signed)
The patient is here to for his first postoperative visit status post a right total hip arthroplasty.  He is doing well overall.  His right hip incision looks good.  The staples were removed and Steri-Strips applied.  There is no significant seroma.  His leg lengths are equal.  His calf is soft.  He has been compliant with his baby aspirin twice a day.  He is already back in the gym exercising lightly.

## 2022-07-14 NOTE — Telephone Encounter (Signed)
Ortho bundle call completed. 

## 2022-07-25 ENCOUNTER — Encounter: Payer: Medicare Other | Admitting: Orthopaedic Surgery

## 2022-07-27 ENCOUNTER — Other Ambulatory Visit: Payer: Self-pay | Admitting: Orthopaedic Surgery

## 2022-07-27 ENCOUNTER — Telehealth: Payer: Self-pay | Admitting: *Deleted

## 2022-07-27 MED ORDER — METHOCARBAMOL 500 MG PO TABS: 500.0000 mg | ORAL_TABLET | Freq: Four times a day (QID) | ORAL | 0 refills | Status: DC | PRN

## 2022-07-27 MED ORDER — TRAMADOL HCL 50 MG PO TABS: 50.0000 mg | ORAL_TABLET | Freq: Two times a day (BID) | ORAL | 0 refills | Status: DC | PRN

## 2022-07-27 NOTE — Telephone Encounter (Signed)
Patient reports new pain along side of leg distal to hip incision that runs toward the knee. He was doing well and has completed therapy and has been walking and going to the gym. Using recumbent bike and also standing doing exercises like donkey kicks at kitchen sink. I explained that most likely he has overdone it slightly and he needs to rest, use ice or heat, use his muscle relaxer if needed and just walk some.He is having trouble sleeping due to pain over last several nights, so he asked if he could get an Rx for Tramadol, which he had 1 of and it helped, but has no more. Asked him to call back to be seen if this doesn't improve with rest, ice/heat, and pain relievers. He said he would. Message to MD. Can we refill muscle relaxer and Tramadol? Pharmacy is [facility] on chart. Thanks.

## 2022-07-28 ENCOUNTER — Other Ambulatory Visit: Payer: Self-pay | Admitting: *Deleted

## 2022-07-28 NOTE — Patient Outreach (Signed)
[place] Coordinator follow up.   Verified with Wilhemena Durie social worker, Mr. Cargile discharged from [facility] on 07/08/22. He is active with Ortho [facility] case manager.  No identifiable THN care coordination needs.   Marthenia Rolling, MSN, RN,BSN [place] Acute Care Coordinator [phone] (Direct dial)

## 2022-08-08 ENCOUNTER — Telehealth: Payer: Self-pay | Admitting: Orthopaedic Surgery

## 2022-08-08 ENCOUNTER — Telehealth: Payer: Self-pay

## 2022-08-08 ENCOUNTER — Other Ambulatory Visit: Payer: Self-pay | Admitting: Orthopaedic Surgery

## 2022-08-08 MED ORDER — TRAMADOL HCL 50 MG PO TABS: 50.0000 mg | ORAL_TABLET | Freq: Two times a day (BID) | ORAL | 0 refills | Status: AC | PRN

## 2022-08-08 NOTE — Telephone Encounter (Signed)
Request Refill of Tramadol Right THA 06/30/22 [facility]

## 2022-08-08 NOTE — Telephone Encounter (Signed)
Pt called requesting a call from [facility] or [facility]. Pt did not leave a reason for call back. Pt phone number is [phone].

## 2022-08-08 NOTE — Telephone Encounter (Signed)
Wants refill of Tramadol, I have already sent request to [facility]

## 2022-08-10 NOTE — Progress Notes (Signed)
Daniel [name] Daniel [name] [address] Phone: [phone] Subjective:   Daniel [name], am serving as a scribe for [name].  I'm seeing this patient by the request  of:  Bernerd Limbo, MD  CC: Right leg pain  [id number]  Daniel [name] is a 72 y.o. male coming in with complaint of back and neck pain. OMT 05/25/2022. R THR in February 2024 by Dr. Ninfa Linden. Patient states since surgery he has had pain in the R leg on the lateral side below the incision, the medial thigh, and in the R knee. Pain is throbbing, but feels better with ice. Has been on tramadol since surgery. If he doesn't take medication, pain is so intense he is unable to do anything.   Medications patient has been prescribed: Elavil, hydroxyzine  Taking: Intermittently         Reviewed prior external information including notes and imaging from previsou exam, outside providers and external EMR if available.   As well as notes that were available from care everywhere and other healthcare systems.  Past medical history, social, surgical and family history all reviewed in electronic medical record.  No pertanent information unless stated regarding to the chief complaint.   Past Medical History:  Diagnosis Date   Acne    Anxiety    DDD (degenerative disc disease), lumbosacral    chronic back pain   Diverticulosis of colon (without mention of hemorrhage)    colo 09/2010   History of atrial fibrillation 10/30/2016   Low back pain radiating to both legs     Allergies  Allergen Reactions   Durezol [Difluprednate] Swelling    These eye drops cause swelling     Review of Systems:  No headache, visual changes, nausea, vomiting, diarrhea, constipation, dizziness, abdominal pain, skin rash, fevers, chills, night sweats, weight loss, swollen lymph nodes, , joint swelling, chest pain, shortness of breath, mood changes. POSITIVE muscle aches, body aches  Objective   Blood pressure (!) 118/90, pulse 76, height 5\' 7"  (1.702 m), weight 209 lb (94.8 kg), SpO2 98 %.   General: No apparent distress alert and oriented x3 mood and affect normal, dressed appropriately.  HEENT: Pupils equal, extraocular movements intact  Respiratory: Patient's speak in full sentences and does not appear short of breath  Cardiovascular: No lower extremity edema, non tender, no erythema  Severely antalgic gait noted.  Using the aid of a cane to walk.  The patient does not have any swelling but does have pain over the lateral aspect of the hip as well as the lateral aspect of the thigh.  Patient's right knee does have trace effusion noted.  Crepitus noted.  Lateral tracking of the kneecap noted. Negative fulcrum test to the thigh   After informed written and verbal consent, patient was seated on exam table. Right knee was prepped with alcohol swab and utilizing anterolateral approach, patient's right knee space was injected with 4:1  marcaine 0.5%: Kenalog 40mg /dL. Patient tolerated the procedure well without immediate complications.       Assessment and Plan:  Degenerative joint disease of knee, right Patient given injection and tolerated the procedure well.  Chronic problem with exacerbation.  On x-rays only mild arthritic changes mostly of the patellofemoral joint.  I do think that this was secondary to compensating for the postsurgical changes.  No pain in the calf or any swelling of the lower extremities and makes me concerned of anything else at this time.  Discussed with patient to start increasing activity.  Patient is seen the orthopedic surgeon who did his hip.  Femur x-ray was unremarkable.  Seems to be walking better on the way out.  We would like to see him again for 6 weeks for further evaluation to make sure there is no lumbar radiculopathy is also contributing with patient having known degenerative disc disease.       The above documentation has been reviewed and is  accurate and complete Lyndal Pulley, DO          Note: This dictation was prepared with Dragon dictation along with smaller phrase technology. Any transcriptional errors that result from this process are unintentional.

## 2022-08-11 ENCOUNTER — Encounter: Payer: Self-pay | Admitting: Family Medicine

## 2022-08-11 ENCOUNTER — Ambulatory Visit (INDEPENDENT_AMBULATORY_CARE_PROVIDER_SITE_OTHER): Payer: Medicare Other | Admitting: Family Medicine

## 2022-08-11 ENCOUNTER — Other Ambulatory Visit: Payer: Self-pay | Admitting: Family Medicine

## 2022-08-11 ENCOUNTER — Encounter: Payer: Self-pay | Admitting: Orthopaedic Surgery

## 2022-08-11 ENCOUNTER — Ambulatory Visit (INDEPENDENT_AMBULATORY_CARE_PROVIDER_SITE_OTHER): Payer: Medicare Other

## 2022-08-11 ENCOUNTER — Ambulatory Visit (INDEPENDENT_AMBULATORY_CARE_PROVIDER_SITE_OTHER): Payer: Medicare Other | Admitting: Orthopaedic Surgery

## 2022-08-11 VITALS — BP 118/90 | HR 76 | Ht 67.0 in | Wt 209.0 lb

## 2022-08-11 DIAGNOSIS — M79604 Pain in right leg: Secondary | ICD-10-CM

## 2022-08-11 DIAGNOSIS — M25561 Pain in right knee: Secondary | ICD-10-CM

## 2022-08-11 DIAGNOSIS — M5137 Other intervertebral disc degeneration, lumbosacral region: Secondary | ICD-10-CM

## 2022-08-11 DIAGNOSIS — Z96641 Presence of right artificial hip joint: Secondary | ICD-10-CM

## 2022-08-11 DIAGNOSIS — M1711 Unilateral primary osteoarthritis, right knee: Secondary | ICD-10-CM | POA: Insufficient documentation

## 2022-08-11 MED ORDER — HYDROXYZINE HCL 10 MG PO TABS: 10.0000 mg | ORAL_TABLET | Freq: Three times a day (TID) | ORAL | 0 refills | Status: DC | PRN

## 2022-08-11 NOTE — Patient Instructions (Signed)
Injection today in knee See you again in 6 weeks for MSK

## 2022-08-11 NOTE — Progress Notes (Signed)
The patient is a 72 year old gentleman who is now 6 weeks status post a right total hip arthroplasty.  He has been having right knee pain as well as IT band pain along the lateral aspect of his thigh.  He actually saw [name] today and he did x-ray his right femur and knee.  There is on the canopy system as well.  He did receive a steroid injection in his right knee today as well.  He is ambulate with a cane.  We have refilled tramadol recently and I still feel like he needs to be on this and when he runs low he will let us know.  His right hip moves smoothly.  There is pain along the IT band at the mid thigh area and some proximal and some distal.  His knee shows no effusion.  There is slight varus malalignment.  I did review all of the x-rays from today of his right femur and knee and I see no complicating features with her hip replacement and his knee joint spaces still well-maintained.  I would like him to try Voltaren gel and stretching exercises over the IT band.  I am fine with him getting back to his regular exercise routine.  I would like to see him back in just 3 weeks for repeat exam but no x-rays are needed.  We would consider a steroid injection over the IT band if needed.

## 2022-08-11 NOTE — Assessment & Plan Note (Signed)
Patient given injection and tolerated the procedure well.  Chronic problem with exacerbation.  On x-rays only mild arthritic changes mostly of the patellofemoral joint.  I do think that this was secondary to compensating for the postsurgical changes.  No pain in the calf or any swelling of the lower extremities and makes me concerned of anything else at this time.  Discussed with patient to start increasing activity.  Patient is seen the orthopedic surgeon who did his hip.  Femur x-ray was unremarkable.  Seems to be walking better on the way out.  We would like to see him again for 6 weeks for further evaluation to make sure there is no lumbar radiculopathy is also contributing with patient having known degenerative disc disease.

## 2022-08-24 ENCOUNTER — Ambulatory Visit: Payer: Medicare Other | Admitting: Family Medicine

## 2022-09-01 ENCOUNTER — Encounter: Payer: Self-pay | Admitting: Orthopaedic Surgery

## 2022-09-01 ENCOUNTER — Ambulatory Visit (INDEPENDENT_AMBULATORY_CARE_PROVIDER_SITE_OTHER): Payer: Medicare Other | Admitting: Orthopaedic Surgery

## 2022-09-01 DIAGNOSIS — Z96641 Presence of right artificial hip joint: Secondary | ICD-10-CM

## 2022-09-01 MED ORDER — PREDNISONE 50 MG PO TABS: ORAL_TABLET | ORAL | 0 refills | Status: AC

## 2022-09-01 NOTE — Progress Notes (Signed)
The patient is now 2 months status post a right total hip arthroplasty.  He is still having definitely IT band pain along his right trochanter area and down toward the knee.  When he first stands up he is getting a catching sensation in his left hip.  Left hip was normal on x-ray.  On exam left hip moves smoothly and fluidly.  His right operative hip moves smoothly.  His pain is deftly along the IT band and trochanteric area on the right side.  I will start him on prednisone 50 mg daily for 5 days.  I would like to see him back in a month and I would consider a steroid injection then when he is far enough out from surgery if that is needed on the right side.  I did give him prescription for outpatient physical therapy in [place] with [facility].  Will have them work on any modalities that can help with this as well.  All questions and concerns were answered and addressed.  Will see him back in 4 weeks and I would like a standing AP pelvis at that visit.

## 2022-09-13 ENCOUNTER — Telehealth: Payer: Self-pay | Admitting: Orthopaedic Surgery

## 2022-09-13 NOTE — Telephone Encounter (Signed)
Received vm from [place] w/ [facility] requesting Op note and most recent progress note. I faxed to [phone], ph [phone]

## 2022-09-21 NOTE — Progress Notes (Unsigned)
[name] [address] [place] [address] Phone: [phone] Subjective:   Daniel [name], am serving as a scribe for Dr. Antoine Primas.  I'm seeing this patient by the request  of:  Tracey Harries, MD  CC: Back and neck pain follow-up  [name]  Daniel [name] is a 72 y.o. male coming in with complaint of back and neck pain. OMT 08/11/2022. Patient states doing well. Started PT. Getting the oral testosterone. No new concerns.  Medications patient has been prescribed: Elavil          Reviewed prior external information including notes and imaging from previsou exam, outside providers and external EMR if available.   As well as notes that were available from care everywhere and other healthcare systems.  Past medical history, social, surgical and family history all reviewed in electronic medical record.  No pertanent information unless stated regarding to the chief complaint.   Past Medical History:  Diagnosis Date   Acne    Anxiety    DDD (degenerative disc disease), lumbosacral    chronic back pain   Diverticulosis of colon (without mention of hemorrhage)    colo 09/2010   History of atrial fibrillation 10/30/2016   Low back pain radiating to both legs     Allergies  Allergen Reactions   Durezol [Difluprednate] Swelling    These eye drops cause swelling     Review of Systems:  No headache, visual changes, nausea, vomiting, diarrhea, constipation, dizziness, abdominal pain, skin rash, fevers, chills, night sweats, weight loss, swollen lymph nodes, body aches, joint swelling, chest pain, shortness of breath, mood changes. POSITIVE muscle aches  Objective  Blood pressure 122/72, pulse 87, height 5\' 7"  (1.702 m), weight 210 lb (95.3 kg), SpO2 97 %.   General: No apparent distress alert and oriented x3 mood and affect normal, dressed appropriately.  HEENT: Pupils equal, extraocular movements intact  Respiratory: Patient's  speak in full sentences and does not appear short of breath  Cardiovascular: No lower extremity edema, non tender, no erythema  Low back does have loss of lordosis.  Antalgic gait noted.  Patient is ambulatory.  Does have weakness with hip flexion and abduction of the right hip compared to the contralateral side.  Osteopathic findings  C2 flexed rotated and side bent right C5 flexed rotated and side bent left T5 extended rotated and side bent right inhaled rib L2 flexed rotated and side bent right L5 flexed rotated and side bent left Sacrum right on right       Assessment and Plan:  Status post total replacement of right hip Has weakness of the right hip and musculature.  Still has weakness of the right leg noted on exam today.  Encouraged him to continue with formal physical therapy which I think will be highly beneficial.  Patient will continue to work on core strengthening which also is necessary at this time.  Follow-up with me again in 6 to 8 weeks otherwise.    Nonallopathic problems  Decision today to treat with OMT was based on Physical Exam  After verbal consent patient was treated with HVLA, ME, FPR techniques in cervical, rib, thoracic, lumbar, and sacral  areas  Patient tolerated the procedure well with improvement in symptoms  Patient given exercises, stretches and lifestyle modifications  See medications in patient instructions if given  Patient will follow up in 4-8 weeks     The above documentation has been reviewed and is accurate and complete  Lyndal Pulley, DO         Note: This dictation was prepared with Dragon dictation along with smaller phrase technology. Any transcriptional errors that result from this process are unintentional.

## 2022-09-22 ENCOUNTER — Encounter: Payer: Self-pay | Admitting: Family Medicine

## 2022-09-22 ENCOUNTER — Ambulatory Visit (INDEPENDENT_AMBULATORY_CARE_PROVIDER_SITE_OTHER): Payer: Medicare Other | Admitting: Family Medicine

## 2022-09-22 VITALS — BP 122/72 | HR 87 | Ht 67.0 in | Wt 210.0 lb

## 2022-09-22 DIAGNOSIS — M9903 Segmental and somatic dysfunction of lumbar region: Secondary | ICD-10-CM | POA: Diagnosis not present

## 2022-09-22 DIAGNOSIS — Z96641 Presence of right artificial hip joint: Secondary | ICD-10-CM

## 2022-09-22 DIAGNOSIS — M9901 Segmental and somatic dysfunction of cervical region: Secondary | ICD-10-CM

## 2022-09-22 DIAGNOSIS — M9904 Segmental and somatic dysfunction of sacral region: Secondary | ICD-10-CM | POA: Diagnosis not present

## 2022-09-22 DIAGNOSIS — M9908 Segmental and somatic dysfunction of rib cage: Secondary | ICD-10-CM

## 2022-09-22 DIAGNOSIS — M9902 Segmental and somatic dysfunction of thoracic region: Secondary | ICD-10-CM

## 2022-09-22 NOTE — Patient Instructions (Signed)
No more surprise rashes See you again in 6 weeks

## 2022-09-22 NOTE — Assessment & Plan Note (Signed)
Has weakness of the right hip and musculature.  Still has weakness of the right leg noted on exam today.  Encouraged him to continue with formal physical therapy which I think will be highly beneficial.  Patient will continue to work on core strengthening which also is necessary at this time.  Follow-up with me again in 6 to 8 weeks otherwise.

## 2022-10-03 ENCOUNTER — Ambulatory Visit (INDEPENDENT_AMBULATORY_CARE_PROVIDER_SITE_OTHER): Payer: Medicare Other | Admitting: Orthopaedic Surgery

## 2022-10-03 ENCOUNTER — Other Ambulatory Visit (INDEPENDENT_AMBULATORY_CARE_PROVIDER_SITE_OTHER): Payer: Medicare Other

## 2022-10-03 ENCOUNTER — Encounter: Payer: Self-pay | Admitting: Orthopaedic Surgery

## 2022-10-03 DIAGNOSIS — Z96641 Presence of right artificial hip joint: Secondary | ICD-10-CM

## 2022-10-03 NOTE — Progress Notes (Signed)
The patient is well-known to me.  He is now about 14 weeks out from a right total hip arthroplasty.  I wanted to see him in the office today and get an x-ray of his pelvis.  I was concerned about his gait and how he looked in general at his last visit.  He is now in physical therapy again mainly for his back and his IT band.  He is followed regularly by Dr. Ayesha Mohair who is also providing some manipulations and that is actually helped him as well.  Today on exam his gait is much improved from when I saw him last time.  He gets out of a chair much easier and he walks without any significant limp at all.  His right operative hip also move smoothly.  There is some IT band pain to be expected but overall he looks better from my standpoint.  The standing AP pelvis shows a well-seated right total hip arthroplasty with no complicating features.  The left hip joint is well-maintained.  He will continue to increase his activities as comfort allows.  From my standpoint I do not need to see him back for 6 months unless there is issues.  Will have a final standing AP pelvis and lateral of his right hip at that visit.

## 2022-11-03 ENCOUNTER — Ambulatory Visit: Payer: Medicare Other | Admitting: Family Medicine

## 2022-11-18 NOTE — Progress Notes (Unsigned)
[name] [address] [place] [address] Phone: [phone] Subjective:   Daniel [name], am serving as a scribe for Dr. Antoine Primas.  I'm seeing this patient by the request  of:  Tracey Harries, MD  CC: Back and neck pain follow-up  [id number]  Darick Safer is a 72 y.o. male coming in with complaint of back and neck pain. OMT 09/22/2022. Patient states that he is back in PT for hip. Ices lower back.   Wants to talk about losing weight. Has given up alcohol.   Medications patient has been prescribed: Elavil, Hydroxizine, Tramadol  Taking:         Reviewed prior external information including notes and imaging from previsou exam, outside providers and external EMR if available.   As well as notes that were available from care everywhere and other healthcare systems.  Past medical history, social, surgical and family history all reviewed in electronic medical record.  No pertanent information unless stated regarding to the chief complaint.   Past Medical History:  Diagnosis Date   Acne    Anxiety    DDD (degenerative disc disease), lumbosacral    chronic back pain   Diverticulosis of colon (without mention of hemorrhage)    colo 09/2010   History of atrial fibrillation 10/30/2016   Low back pain radiating to both legs     Allergies  Allergen Reactions   Durezol [Difluprednate] Swelling    These eye drops cause swelling     Review of Systems:  No headache, visual changes, nausea, vomiting, diarrhea, constipation, dizziness, abdominal pain, skin rash, fevers, chills, night sweats, weight loss, swollen lymph nodes, body aches, joint swelling, chest pain, shortness of breath, mood changes. POSITIVE muscle aches  Objective  Blood pressure 110/70, pulse 63, height 5\' 7"  (1.702 m), weight 220 lb (99.8 kg), SpO2 96 %.   General: No apparent distress alert and oriented x3 mood and affect normal, dressed appropriately.  HEENT:  Pupils equal, extraocular movements intact  Respiratory: Patient's speak in full sentences and does not appear short of breath  Cardiovascular: No lower extremity edema, non tender, no erythema  Back exam does have some loss of lordosis noted.  Antalgic gait noted.  Patient has worsening pain with extension of the back noted. Poor core strength noted at the moment.  Antalgic gait may be worse than usual.   Osteopathic findings  C2 flexed rotated and side bent right C7 flexed rotated and side bent left T3 extended rotated and side bent right inhaled rib T8 extended rotated and side bent left L2 flexed rotated and side bent right Sacrum right on right       Assessment and Plan:  DDD (degenerative disc disease), lumbosacral Significant arthritic changes.  Do believe that the hip replacement has caused some different kinetics.  Do feel that physical therapy will be beneficial.  In addition to this though I do feel an unclear epidural will be helpful.  Patient has gained weight secondary to not being quite as active and will refer patient to also weight management.  Patient just needs some motivation and losing 20 to 30 pounds will be significantly beneficial.  Follow-up again in 6 to 8 weeks    Nonallopathic problems  Decision today to treat with OMT was based on Physical Exam  After verbal consent patient was treated with HVLA, ME, FPR techniques in cervical, rib, thoracic, lumbar, and sacral  areas  Patient tolerated the procedure well with improvement  in symptoms  Patient given exercises, stretches and lifestyle modifications  See medications in patient instructions if given  Patient will follow up in 4-8 weeks     The above documentation has been reviewed and is accurate and complete Judi Saa, DO         Note: This dictation was prepared with Dragon dictation along with smaller phrase technology. Any transcriptional errors that result from this process are  unintentional.

## 2022-11-21 ENCOUNTER — Ambulatory Visit (INDEPENDENT_AMBULATORY_CARE_PROVIDER_SITE_OTHER): Payer: Medicare Other | Admitting: Family Medicine

## 2022-11-21 ENCOUNTER — Encounter: Payer: Self-pay | Admitting: Family Medicine

## 2022-11-21 VITALS — BP 110/70 | HR 63 | Ht 67.0 in | Wt 220.0 lb

## 2022-11-21 DIAGNOSIS — M9903 Segmental and somatic dysfunction of lumbar region: Secondary | ICD-10-CM

## 2022-11-21 DIAGNOSIS — E669 Obesity, unspecified: Secondary | ICD-10-CM | POA: Diagnosis not present

## 2022-11-21 DIAGNOSIS — M9902 Segmental and somatic dysfunction of thoracic region: Secondary | ICD-10-CM

## 2022-11-21 DIAGNOSIS — M9901 Segmental and somatic dysfunction of cervical region: Secondary | ICD-10-CM | POA: Diagnosis not present

## 2022-11-21 DIAGNOSIS — M5137 Other intervertebral disc degeneration, lumbosacral region: Secondary | ICD-10-CM | POA: Diagnosis not present

## 2022-11-21 DIAGNOSIS — M51379 Other intervertebral disc degeneration, lumbosacral region without mention of lumbar back pain or lower extremity pain: Secondary | ICD-10-CM

## 2022-11-21 DIAGNOSIS — M9908 Segmental and somatic dysfunction of rib cage: Secondary | ICD-10-CM

## 2022-11-21 DIAGNOSIS — M9904 Segmental and somatic dysfunction of sacral region: Secondary | ICD-10-CM | POA: Diagnosis not present

## 2022-11-21 NOTE — Assessment & Plan Note (Signed)
Significant arthritic changes.  Do believe that the hip replacement has caused some different kinetics.  Do feel that physical therapy will be beneficial.  In addition to this though I do feel an unclear epidural will be helpful.  Patient has gained weight secondary to not being quite as active and will refer patient to also weight management.  Patient just needs some motivation and losing 20 to 30 pounds will be significantly beneficial.  Follow-up again in 6 to 8 weeks

## 2022-11-21 NOTE — Patient Instructions (Addendum)
Referral to Dr. Earlene Plater See me again in 6 weeks

## 2023-01-02 NOTE — Progress Notes (Unsigned)
[name] [address] [place] [address] Phone: [phone] Subjective:   Daniel [name], am serving as a scribe for Dr. Antoine Primas.  I'm seeing this patient by the request  of:  Tracey Harries, MD  CC: back and neck pain follow up   [name]  Daniel [name] is a 72 y.o. male coming in with complaint of back and neck pain. OMT 11/21/2022. Patient states that he has been the same as last visit.   Medications patient has been prescribed:None           Reviewed prior external information including notes and imaging from previsou exam, outside providers and external EMR if available.   As well as notes that were available from care everywhere and other healthcare systems.  Past medical history, social, surgical and family history all reviewed in electronic medical record.  No pertanent information unless stated regarding to the chief complaint.   Past Medical History:  Diagnosis Date   Acne    Anxiety    DDD (degenerative disc disease), lumbosacral    chronic back pain   Diverticulosis of colon (without mention of hemorrhage)    colo 09/2010   History of atrial fibrillation 10/30/2016   Low back pain radiating to both legs     Allergies  Allergen Reactions   Durezol [Difluprednate] Swelling    These eye drops cause swelling     Review of Systems:  No headache, visual changes, nausea, vomiting, diarrhea, constipation, dizziness, abdominal pain, skin rash, fevers, chills, night sweats, weight loss, swollen lymph nodes, body aches, joint swelling, chest pain, shortness of breath, mood changes. POSITIVE muscle aches  Objective  Blood pressure 124/84, pulse 70, height 5\' 7"  (1.702 m), weight 224 lb (101.6 kg), SpO2 97%.   General: No apparent distress alert and oriented x3 mood and affect normal, dressed appropriately.  HEENT: Pupils equal, extraocular movements intact  Respiratory: Patient's speak in full sentences and does  not appear short of breath  Cardiovascular: No lower extremity edema, non tender, no erythema  Patient does have some loss of lordosis of the lumbar spine.  Patient does have an antalgic gait secondary to the hip still.  He does have tightness noted of the hip flexors bilaterally.  Osteopathic findings C6 flexed rotated and side bent left T3 extended rotated and side bent right inhaled rib T8 extended rotated and side bent left L1 flexed rotated and side bent right Sacrum right on right     Assessment and Plan:  Status post total replacement of right hip Continues to have tightness noted.  Given Singulair to see if this will be beneficial for some of the potential inflammation that is contributing.  Discussed icing regimen.  Discussed core strengthening.  Still having difficulty with weight loss but unable to see weight management.  Low testosterone in male Last value was in the 500s.  I believe the patient is doing well where he is at the moment.  Still and that has not helped in the with the weight loss.  Has helped him with some of the energy though.  DDD (degenerative disc disease), lumbosacral Known degenerative disc disease, worsening pain can consider the possibility of epidurals if needed.  Hopefully though we can do things without the aggressive interventions.  Discussed with patient still the core strengthening and weight loss would be the most beneficial.  Will check on patient's referral to weight management.  Follow-up again in 6 to 8 weeks otherwise  Nonallopathic problems  Decision today to treat with OMT was based on Physical Exam  After verbal consent patient was treated with HVLA, ME, FPR techniques in cervical, rib, thoracic, lumbar, and sacral  areas  Patient tolerated the procedure well with improvement in symptoms  Patient given exercises, stretches and lifestyle modifications  See medications in patient instructions if given  Patient will follow up in 4-8  weeks    The above documentation has been reviewed and is accurate and complete Judi Saa, DO          Note: This dictation was prepared with Dragon dictation along with smaller phrase technology. Any transcriptional errors that result from this process are unintentional.

## 2023-01-03 ENCOUNTER — Encounter: Payer: Self-pay | Admitting: Family Medicine

## 2023-01-03 ENCOUNTER — Ambulatory Visit (INDEPENDENT_AMBULATORY_CARE_PROVIDER_SITE_OTHER): Payer: Medicare Other | Admitting: Family Medicine

## 2023-01-03 VITALS — BP 124/84 | HR 70 | Ht 67.0 in | Wt 224.0 lb

## 2023-01-03 DIAGNOSIS — M9908 Segmental and somatic dysfunction of rib cage: Secondary | ICD-10-CM

## 2023-01-03 DIAGNOSIS — M9901 Segmental and somatic dysfunction of cervical region: Secondary | ICD-10-CM | POA: Diagnosis not present

## 2023-01-03 DIAGNOSIS — Z96641 Presence of right artificial hip joint: Secondary | ICD-10-CM

## 2023-01-03 DIAGNOSIS — M9903 Segmental and somatic dysfunction of lumbar region: Secondary | ICD-10-CM | POA: Diagnosis not present

## 2023-01-03 DIAGNOSIS — M9904 Segmental and somatic dysfunction of sacral region: Secondary | ICD-10-CM

## 2023-01-03 DIAGNOSIS — M5137 Other intervertebral disc degeneration, lumbosacral region: Secondary | ICD-10-CM

## 2023-01-03 DIAGNOSIS — R7989 Other specified abnormal findings of blood chemistry: Secondary | ICD-10-CM | POA: Diagnosis not present

## 2023-01-03 DIAGNOSIS — M51379 Other intervertebral disc degeneration, lumbosacral region without mention of lumbar back pain or lower extremity pain: Secondary | ICD-10-CM

## 2023-01-03 DIAGNOSIS — M9902 Segmental and somatic dysfunction of thoracic region: Secondary | ICD-10-CM

## 2023-01-03 MED ORDER — MONTELUKAST SODIUM 10 MG PO TABS: 10.0000 mg | ORAL_TABLET | Freq: Every day | ORAL | 0 refills | Status: AC

## 2023-01-03 NOTE — Assessment & Plan Note (Signed)
Last value was in the 500s.  I believe the patient is doing well where he is at the moment.  Still and that has not helped in the with the weight loss.  Has helped him with some of the energy though.

## 2023-01-03 NOTE — Patient Instructions (Signed)
Start singulair 10mg   I will check with [facility] See me in 6-8 weeks

## 2023-01-03 NOTE — Assessment & Plan Note (Signed)
Known degenerative disc disease, worsening pain can consider the possibility of epidurals if needed.  Hopefully though we can do things without the aggressive interventions.  Discussed with patient still the core strengthening and weight loss would be the most beneficial.  Will check on patient's referral to weight management.  Follow-up again in 6 to 8 weeks otherwise

## 2023-01-03 NOTE — Assessment & Plan Note (Signed)
Continues to have tightness noted.  Given Singulair to see if this will be beneficial for some of the potential inflammation that is contributing.  Discussed icing regimen.  Discussed core strengthening.  Still having difficulty with weight loss but unable to see weight management.

## 2023-02-09 NOTE — Progress Notes (Signed)
[name] [address] [place] [address] Phone: [phone] Subjective:   Daniel [name], am serving as a scribe for Dr. Antoine Primas.  I'm seeing this patient by the request  of:  Tracey Harries, MD  CC: Low back and neck pain follow-up  [id number]  Daniel [name] is a 72 y.o. male coming in with complaint of back and neck pain. OMT 01/03/2023. Patient states that he is stiff today.   Medications patient has been prescribed: Hydroxizine  Taking:         Reviewed prior external information including notes and imaging from previsou exam, outside providers and external EMR if available.   As well as notes that were available from care everywhere and other healthcare systems.  Past medical history, social, surgical and family history all reviewed in electronic medical record.  No pertanent information unless stated regarding to the chief complaint.   Past Medical History:  Diagnosis Date   Acne    Anxiety    DDD (degenerative disc disease), lumbosacral    chronic back pain   Diverticulosis of colon (without mention of hemorrhage)    colo 09/2010   History of atrial fibrillation 10/30/2016   Low back pain radiating to both legs     Allergies  Allergen Reactions   Durezol [Difluprednate] Swelling    These eye drops cause swelling     Review of Systems:  No headache, visual changes, nausea, vomiting, diarrhea, constipation, dizziness, abdominal pain, skin rash, fevers, chills, night sweats, weight loss, swollen lymph nodes, body aches, joint swelling, chest pain, shortness of breath, mood changes. POSITIVE muscle aches  Objective  Blood pressure 128/78, pulse 62, height 5\' 7"  (1.702 m), weight 219 lb (99.3 kg), SpO2 98%.   General: No apparent distress alert and oriented x3 mood and affect normal, dressed appropriately.  HEENT: Pupils equal, extraocular movements intact  Respiratory: Patient's speak in full sentences and  does not appear short of breath  Cardiovascular: No lower extremity edema, non tender, no erythema  Low back has severe loss of lordosis.  Tenderness to palpation of the paraspinal musculature.  Patient does have tightness with straight leg test but no true radicular symptoms.  Tightness with FABER test as well.  Neurovascularly intact  Osteopathic findings  C2 flexed rotated and side bent right C6 flexed rotated and side bent left T3 extended rotated and side bent right inhaled rib T9 extended rotated and side bent left T11 flexed rotated and side bent right L2 flexed rotated and side bent right L3 flexed rotated and side bent left L5 flexed rotated and side bent right Sacrum right on right       Assessment and Plan:  DDD (degenerative disc disease), lumbosacral Continues to have significant tightness noted.  No significant change in medications but we may need to continue to monitor.  Discussed icing regimen and home exercises, discussed avoiding certain activities.  Increase certain core strengthening exercises I believe that would be helpful.  Follow-up with me again in 6 to 8 weeks otherwise.    Nonallopathic problems  Decision today to treat with OMT was based on Physical Exam  After verbal consent patient was treated with HVLA, ME, FPR techniques in cervical, rib, thoracic, lumbar, and sacral  areas  Patient tolerated the procedure well with improvement in symptoms  Patient given exercises, stretches and lifestyle modifications  See medications in patient instructions if given  Patient will follow up in 4-8 weeks  The above documentation has been reviewed and is accurate and complete Judi Saa, DO         Note: This dictation was prepared with Dragon dictation along with smaller phrase technology. Any transcriptional errors that result from this process are unintentional.

## 2023-02-13 ENCOUNTER — Ambulatory Visit (INDEPENDENT_AMBULATORY_CARE_PROVIDER_SITE_OTHER): Payer: Medicare Other | Admitting: Family Medicine

## 2023-02-13 ENCOUNTER — Encounter: Payer: Self-pay | Admitting: Family Medicine

## 2023-02-13 VITALS — BP 128/78 | HR 62 | Ht 67.0 in | Wt 219.0 lb

## 2023-02-13 DIAGNOSIS — M9902 Segmental and somatic dysfunction of thoracic region: Secondary | ICD-10-CM

## 2023-02-13 DIAGNOSIS — M9904 Segmental and somatic dysfunction of sacral region: Secondary | ICD-10-CM

## 2023-02-13 DIAGNOSIS — M5137 Other intervertebral disc degeneration, lumbosacral region: Secondary | ICD-10-CM | POA: Diagnosis not present

## 2023-02-13 DIAGNOSIS — M9903 Segmental and somatic dysfunction of lumbar region: Secondary | ICD-10-CM | POA: Diagnosis not present

## 2023-02-13 DIAGNOSIS — M9908 Segmental and somatic dysfunction of rib cage: Secondary | ICD-10-CM | POA: Diagnosis not present

## 2023-02-13 DIAGNOSIS — M9901 Segmental and somatic dysfunction of cervical region: Secondary | ICD-10-CM

## 2023-02-13 DIAGNOSIS — M51379 Other intervertebral disc degeneration, lumbosacral region without mention of lumbar back pain or lower extremity pain: Secondary | ICD-10-CM

## 2023-02-13 NOTE — Assessment & Plan Note (Signed)
Continues to have significant tightness noted.  No significant change in medications but we may need to continue to monitor.  Discussed icing regimen and home exercises, discussed avoiding certain activities.  Increase certain core strengthening exercises I believe that would be helpful.  Follow-up with me again in 6 to 8 weeks otherwise.

## 2023-02-13 NOTE — Patient Instructions (Signed)
Good to see you! See you again in 4-6 weeks

## 2023-03-10 NOTE — Progress Notes (Deleted)
  [name] [address] [place] [address] Phone: [phone] Subjective:    I'm seeing this patient by the request  of:  Tracey Harries, MD  CC:   [name]  Rodricus Dedrick is a 72 y.o. male coming in with complaint of back and neck pain. OMT 02/13/2023. Patient states   Medications patient has been prescribed: Singulair  Taking:         Reviewed prior external information including notes and imaging from previsou exam, outside providers and external EMR if available.   As well as notes that were available from care everywhere and other healthcare systems.  Past medical history, social, surgical and family history all reviewed in electronic medical record.  No pertanent information unless stated regarding to the chief complaint.   Past Medical History:  Diagnosis Date   Acne    Anxiety    DDD (degenerative disc disease), lumbosacral    chronic back pain   Diverticulosis of colon (without mention of hemorrhage)    colo 09/2010   History of atrial fibrillation 10/30/2016   Low back pain radiating to both legs     Allergies  Allergen Reactions   Durezol [Difluprednate] Swelling    These eye drops cause swelling     Review of Systems:  No headache, visual changes, nausea, vomiting, diarrhea, constipation, dizziness, abdominal pain, skin rash, fevers, chills, night sweats, weight loss, swollen lymph nodes, body aches, joint swelling, chest pain, shortness of breath, mood changes. POSITIVE muscle aches  Objective  There were no vitals taken for this visit.   General: No apparent distress alert and oriented x3 mood and affect normal, dressed appropriately.  HEENT: Pupils equal, extraocular movements intact  Respiratory: Patient's speak in full sentences and does not appear short of breath  Cardiovascular: No lower extremity edema, non tender, no erythema  Gait MSK:  Back   Osteopathic findings  C2 flexed rotated and side  bent right C6 flexed rotated and side bent left T3 extended rotated and side bent right inhaled rib T9 extended rotated and side bent left L2 flexed rotated and side bent right Sacrum right on right       Assessment and Plan:  No problem-specific Assessment & Plan notes found for this encounter.    Nonallopathic problems  Decision today to treat with OMT was based on Physical Exam  After verbal consent patient was treated with HVLA, ME, FPR techniques in cervical, rib, thoracic, lumbar, and sacral  areas  Patient tolerated the procedure well with improvement in symptoms  Patient given exercises, stretches and lifestyle modifications  See medications in patient instructions if given  Patient will follow up in 4-8 weeks             Note: This dictation was prepared with Dragon dictation along with smaller phrase technology. Any transcriptional errors that result from this process are unintentional.

## 2023-03-13 ENCOUNTER — Ambulatory Visit: Payer: Medicare Other | Admitting: Family Medicine

## 2023-03-30 NOTE — Progress Notes (Signed)
[name] [address] [place] [address] Phone: [phone] Subjective:   INadine [name], am serving as a scribe for Dr. Antoine Primas.  I'm seeing this patient by the request  of:  Tracey Harries, MD  CC: back and neck pain follow up   [name]  Daniel [name] is a 72 y.o. male coming in with complaint of back and neck pain. OMT on 02/13/2023. Patient states same per usual.  Going to [facility] for weight loss.        Reviewed prior external information including notes and imaging from previsou exam, outside providers and external EMR if available.   As well as notes that were available from care everywhere and other healthcare systems.  Past medical history, social, surgical and family history all reviewed in electronic medical record.  No pertanent information unless stated regarding to the chief complaint.   Past Medical History:  Diagnosis Date   Acne    Anxiety    DDD (degenerative disc disease), lumbosacral    chronic back pain   Diverticulosis of colon (without mention of hemorrhage)    colo 09/2010   History of atrial fibrillation 10/30/2016   Low back pain radiating to both legs     Allergies  Allergen Reactions   Durezol [Difluprednate] Swelling    These eye drops cause swelling     Review of Systems:  No headache, visual changes, nausea, vomiting, diarrhea, constipation, dizziness, abdominal pain, skin rash, fevers, chills, night sweats, weight loss, swollen lymph nodes, body aches, joint swelling, chest pain, shortness of breath, mood changes. POSITIVE muscle aches  Objective  Blood pressure 118/66, pulse 63, height 5\' 7"  (1.702 m), weight 214 lb (97.1 kg), SpO2 98%.   General: No apparent distress alert and oriented x3 mood and affect normal, dressed appropriately.  HEENT: Pupils equal, extraocular movements intact  Respiratory: Patient's speak in full sentences and does not appear short of breath   Cardiovascular: No lower extremity edema, non tender, no erythema  \Back exam does have some loss of lordosis noted.  Patient does have tightness noted in all planes.  Tightness with straight leg test but no true radicular symptoms.  Tightness with extension as well.  Osteopathic findings  C2 flexed rotated and side bent right C7 flexed rotated and side bent left T3 extended rotated and side bent right inhaled rib T9 extended rotated and side bent left L3 flexed rotated and side bent right Sacrum right on right       Assessment and Plan:  DDD (degenerative disc disease), lumbosacral Degenerative disc disease.  Discussed which activities to do and which ones to avoid.  Increase activity slowly.  Follow-up again in 6 to 8 weeks.  Doing limited sidebending bilaterally.    Nonallopathic problems  Decision today to treat with OMT was based on Physical Exam  After verbal consent patient was treated with HVLA, ME, FPR techniques in cervical, rib, thoracic, lumbar, and sacral  areas  Patient tolerated the procedure well with improvement in symptoms  Patient given exercises, stretches and lifestyle modifications  See medications in patient instructions if given  Patient will follow up in 4-8 weeks     The above documentation has been reviewed and is accurate and complete Judi Saa, DO         Note: This dictation was prepared with Dragon dictation along with smaller phrase technology. Any transcriptional errors that result from this process are unintentional.

## 2023-04-03 ENCOUNTER — Ambulatory Visit (INDEPENDENT_AMBULATORY_CARE_PROVIDER_SITE_OTHER): Payer: Medicare Other | Admitting: Family Medicine

## 2023-04-03 ENCOUNTER — Encounter: Payer: Self-pay | Admitting: Family Medicine

## 2023-04-03 ENCOUNTER — Telehealth: Payer: Self-pay | Admitting: Family Medicine

## 2023-04-03 VITALS — BP 118/66 | HR 63 | Ht 67.0 in | Wt 214.0 lb

## 2023-04-03 DIAGNOSIS — M9902 Segmental and somatic dysfunction of thoracic region: Secondary | ICD-10-CM | POA: Diagnosis not present

## 2023-04-03 DIAGNOSIS — M9908 Segmental and somatic dysfunction of rib cage: Secondary | ICD-10-CM | POA: Diagnosis not present

## 2023-04-03 DIAGNOSIS — M9903 Segmental and somatic dysfunction of lumbar region: Secondary | ICD-10-CM

## 2023-04-03 DIAGNOSIS — M9904 Segmental and somatic dysfunction of sacral region: Secondary | ICD-10-CM | POA: Diagnosis not present

## 2023-04-03 DIAGNOSIS — M9901 Segmental and somatic dysfunction of cervical region: Secondary | ICD-10-CM

## 2023-04-03 DIAGNOSIS — M51372 Other intervertebral disc degeneration, lumbosacral region with discogenic back pain and lower extremity pain: Secondary | ICD-10-CM

## 2023-04-03 NOTE — Patient Instructions (Signed)
When you know what the B word is tell me See you again in 7-8 weeks

## 2023-04-03 NOTE — Telephone Encounter (Signed)
Patient called to give [place] info on a med he is taking that is not in MyChart:  Bupropion HCL XL 150 mg  Rx'd by [facility] for weight loss, has been taking for 10 days with no weight loss.

## 2023-04-03 NOTE — Assessment & Plan Note (Signed)
Degenerative disc disease.  Discussed which activities to do and which ones to avoid.  Increase activity slowly.  Follow-up again in 6 to 8 weeks.  Doing limited sidebending bilaterally.

## 2023-04-05 ENCOUNTER — Encounter: Payer: Self-pay | Admitting: Orthopaedic Surgery

## 2023-04-05 ENCOUNTER — Other Ambulatory Visit (INDEPENDENT_AMBULATORY_CARE_PROVIDER_SITE_OTHER): Payer: Medicare Other

## 2023-04-05 ENCOUNTER — Ambulatory Visit (INDEPENDENT_AMBULATORY_CARE_PROVIDER_SITE_OTHER): Payer: Medicare Other | Admitting: Orthopaedic Surgery

## 2023-04-05 DIAGNOSIS — Z96641 Presence of right artificial hip joint: Secondary | ICD-10-CM

## 2023-04-05 NOTE — Progress Notes (Signed)
The patient is now 9 months status post a right total hip arthroplasty.  He is an active and young appearing 72 year old gentleman.  He reports that that hip is doing very well and he has good range of motion and strength with the right hip.  He denies any issues with his left hip.  He walks with a normal-appearing gait.  He did let me know has been dealing with some spine issues and is seeing sports medicine for this.  Both hips move smoothly and fluidly and are equal in her motion.  His leg lengths appear equal.  An AP pelvis and lateral of the right hip shows a well-seated total hip arthroplasty that does appear about a gram with no complicating features.  At this point follow-up for his right hip can be as needed.  He understands that if he does develop any issues at all to not hesitate to reach out to [place].  All questions concerns were addressed and answered.

## 2023-05-30 NOTE — Progress Notes (Signed)
 [name] [address] [place] [address] Phone: [phone] Subjective:   ISusannah Gully, am serving as a scribe for Dr. Arthea Claudene.  I'm seeing this patient by the request  of:  Pura Lenis, MD  CC: Low back pain follow-up  [id number]  Alois Colgan is a 73 y.o. male coming in with complaint of back and neck pain. OMT 04/03/2023. Saw Dr. Althia on 04/05/2023 for follow-up for R hip replacement. Patient states same per usual.  Medications patient has been prescribed: Singulair   Taking:         Reviewed prior external information including notes and imaging from previsou exam, outside providers and external EMR if available.   As well as notes that were available from care everywhere and other healthcare systems.  Past medical history, social, surgical and family history all reviewed in electronic medical record.  No pertanent information unless stated regarding to the chief complaint.   Past Medical History:  Diagnosis Date   Acne    Anxiety    DDD (degenerative disc disease), lumbosacral    chronic back pain   Diverticulosis of colon (without mention of hemorrhage)    colo 09/2010   History of atrial fibrillation 10/30/2016   Low back pain radiating to both legs     Allergies  Allergen Reactions   Durezol [Difluprednate] Swelling    These eye drops cause swelling     Review of Systems:  No headache, visual changes, nausea, vomiting, diarrhea, constipation, dizziness, abdominal pain, skin rash, fevers, chills, night sweats, weight loss, swollen lymph nodes, body aches, joint swelling, chest pain, shortness of breath, mood changes. POSITIVE muscle aches  Objective  Blood pressure 116/88, pulse 64, height 5' 7 (1.702 m), weight 213 lb (96.6 kg), SpO2 98%.   General: No apparent distress alert and oriented x3 mood and affect normal, dressed appropriately.  HEENT: Pupils equal, extraocular movements intact   Respiratory: Patient's speak in full sentences and does not appear short of breath  Cardiovascular: No lower extremity edema, non tender, no erythema  Gait relatively normal does have some significant loss of lordosis noted. MSK:  Back low back exam does have some loss lordosis noted.  Some tenderness to palpation in the paraspinal musculature.  Osteopathic findings  C4 flexed rotated and side bent left T4 extended rotated and side bent right inhaled rib T9 extended rotated and side bent left L4 flexed rotated and side bent right Sacrum right on right       Assessment and Plan:  DDD (degenerative disc disease), lumbosacral Patient is continuing to work on his core strength as well as weight loss.  Do think that this has been the most beneficial for him so far.  Patient does still have significant tightness and known underlying degenerative disc disease.  Will continue to monitor and if any more exacerbation will consider to Toradol  and Depo-Medrol .  No other significant medication changes at the moment.  Follow-up with me again in 6 to 8 weeks otherwise.    Nonallopathic problems  Decision today to treat with OMT was based on Physical Exam  After verbal consent patient was treated with HVLA, ME, FPR techniques in cervical, rib, thoracic, lumbar, and sacral  areas  Patient tolerated the procedure well with improvement in symptoms  Patient given exercises, stretches and lifestyle modifications  See medications in patient instructions if given  Patient will follow up in 4-8 weeks    The above documentation has been reviewed  and is accurate and complete Trinidad Ingle M Chael Urenda, DO          Note: This dictation was prepared with Dragon dictation along with smaller phrase technology. Any transcriptional errors that result from this process are unintentional.

## 2023-06-01 ENCOUNTER — Ambulatory Visit (INDEPENDENT_AMBULATORY_CARE_PROVIDER_SITE_OTHER): Payer: Medicare Other | Admitting: Family Medicine

## 2023-06-01 VITALS — BP 116/88 | HR 64 | Ht 67.0 in | Wt 213.0 lb

## 2023-06-01 DIAGNOSIS — M9904 Segmental and somatic dysfunction of sacral region: Secondary | ICD-10-CM | POA: Diagnosis not present

## 2023-06-01 DIAGNOSIS — M9908 Segmental and somatic dysfunction of rib cage: Secondary | ICD-10-CM | POA: Diagnosis not present

## 2023-06-01 DIAGNOSIS — M9902 Segmental and somatic dysfunction of thoracic region: Secondary | ICD-10-CM

## 2023-06-01 DIAGNOSIS — M51372 Other intervertebral disc degeneration, lumbosacral region with discogenic back pain and lower extremity pain: Secondary | ICD-10-CM | POA: Diagnosis not present

## 2023-06-01 DIAGNOSIS — M9903 Segmental and somatic dysfunction of lumbar region: Secondary | ICD-10-CM | POA: Diagnosis not present

## 2023-06-01 DIAGNOSIS — M9901 Segmental and somatic dysfunction of cervical region: Secondary | ICD-10-CM | POA: Diagnosis not present

## 2023-06-01 NOTE — Patient Instructions (Signed)
Do prescribed exercises at least 3x a week See you again in 6-8 weeks 

## 2023-06-02 ENCOUNTER — Encounter: Payer: Self-pay | Admitting: Family Medicine

## 2023-06-02 NOTE — Assessment & Plan Note (Signed)
 Patient is continuing to work on his core strength as well as weight loss.  Do think that this has been the most beneficial for him so far.  Patient does still have significant tightness and known underlying degenerative disc disease.  Will continue to monitor and if any more exacerbation will consider to Toradol  and Depo-Medrol .  No other significant medication changes at the moment.  Follow-up with me again in 6 to 8 weeks otherwise.

## 2023-07-17 NOTE — Progress Notes (Unsigned)
 [name] [address] [place] [address] Phone: [phone] Subjective:   Daniel [name], am serving as a scribe for Dr. Antoine Primas.  I'm seeing this patient by the request  of:  Tracey Harries, MD  CC: Back and neck pain  [name]  Daniel [name] is a 73 y.o. male coming in with complaint of back and neck pain. OMT 06/01/2023. Patient states that his pain has been the worst that it has been in a while. Picked upa relcliner. Pain throughout the entire lower back. Pain wlll radiate down L glute. Motrin and biofreeze for pain.  Patient states that it is quite severe overall.  Affecting daily activities.  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  Did move a recliner and thinks this is the cause of it.  Medications patient has been prescribed: None          Reviewed prior external information including notes and imaging from previsou exam, outside providers and external EMR if available.   As well as notes that were available from care everywhere and other healthcare systems.  Past medical history, social, surgical and family history all reviewed in electronic medical record.  No pertanent information unless stated regarding to the chief complaint.   Past Medical History:  Diagnosis Date   Acne    Anxiety    DDD (degenerative disc disease), lumbosacral    chronic back pain   Diverticulosis of colon (without mention of hemorrhage)    colo 09/2010   History of atrial fibrillation 10/30/2016   Low back pain radiating to both legs     Allergies  Allergen Reactions   Durezol [Difluprednate] Swelling    These eye drops cause swelling     Review of Systems:  No headache, visual changes, nausea, vomiting, diarrhea, constipation, dizziness, abdominal pain, skin rash, fevers, chills, night sweats, weight loss, swollen lymph nodes, body aches, joint swelling, chest pain, shortness of breath, mood changes. POSITIVE muscle  aches  Objective  Blood pressure 112/84, pulse 65, height 5\' 7"  (1.702 m), weight 210 lb (95.3 kg), SpO2 98%.   General: No apparent distress alert and oriented x3 mood and affect normal, dressed appropriately.  HEENT: Pupils equal, extraocular movements intact  Respiratory: Patient's speak in full sentences and does not appear short of breath  Cardiovascular: No lower extremity edema, non tender, no erythema  Gait MSK:  Back does have some loss lordosis noted.  Some tenderness to palpation in the paraspinal musculature.  Patient does have significant loss of lordosis.  Difficulty with any type of extension or flexion of the back.  Osteopathic findings  C2 flexed rotated and side bent right C6 flexed rotated and side bent left T3 extended rotated and side bent right inhaled rib T9 extended rotated and side bent left L2 flexed rotated and side bent right  Sacrum right on right     Assessment and Plan:  DDD (degenerative disc disease), lumbosacral Unfortunately I do think we have an exacerbation and do think that there is some spinal stenosis contributing.  Attempted osteopathic manipulation within the difficulty and was only unable to do more a muscle energy today.  Toradol and Depo-Medrol given today for the exacerbation and do feel an epidural could be beneficial.  We discussed with patient that icing regimen and home exercises otherwise.  Increase activity slowly.  Follow-up again in 6 to 8 weeks otherwise.    Nonallopathic problems  Decision today to treat with OMT  was based on Physical Exam  After verbal consent patient was treated with  ME, FPR techniques in cervical, rib, thoracic, lumbar, and sacral  areas  Patient tolerated the procedure well with improvement in symptoms  Patient given exercises, stretches and lifestyle modifications  See medications in patient instructions if given  Patient will follow up in 4-8 weeks     The above documentation has been  reviewed and is accurate and complete Judi Saa, DO         Note: This dictation was prepared with Dragon dictation along with smaller phrase technology. Any transcriptional errors that result from this process are unintentional.

## 2023-07-18 ENCOUNTER — Ambulatory Visit (INDEPENDENT_AMBULATORY_CARE_PROVIDER_SITE_OTHER): Payer: Medicare Other | Admitting: Family Medicine

## 2023-07-18 ENCOUNTER — Encounter: Payer: Self-pay | Admitting: Family Medicine

## 2023-07-18 VITALS — BP 112/84 | HR 65 | Ht 67.0 in | Wt 210.0 lb

## 2023-07-18 DIAGNOSIS — M9901 Segmental and somatic dysfunction of cervical region: Secondary | ICD-10-CM

## 2023-07-18 DIAGNOSIS — M9903 Segmental and somatic dysfunction of lumbar region: Secondary | ICD-10-CM

## 2023-07-18 DIAGNOSIS — M9902 Segmental and somatic dysfunction of thoracic region: Secondary | ICD-10-CM

## 2023-07-18 DIAGNOSIS — M2550 Pain in unspecified joint: Secondary | ICD-10-CM

## 2023-07-18 DIAGNOSIS — M9908 Segmental and somatic dysfunction of rib cage: Secondary | ICD-10-CM

## 2023-07-18 DIAGNOSIS — M51372 Other intervertebral disc degeneration, lumbosacral region with discogenic back pain and lower extremity pain: Secondary | ICD-10-CM

## 2023-07-18 DIAGNOSIS — M9904 Segmental and somatic dysfunction of sacral region: Secondary | ICD-10-CM

## 2023-07-18 MED ORDER — KETOROLAC TROMETHAMINE 60 MG/2ML IM SOLN
60.0000 mg | Freq: Once | INTRAMUSCULAR | Status: AC
  Administered 2023-07-18: 60 mg via INTRAMUSCULAR

## 2023-07-18 MED ORDER — METHYLPREDNISOLONE ACETATE 80 MG/ML IJ SUSP
80.0000 mg | Freq: Once | INTRAMUSCULAR | Status: AC
  Administered 2023-07-18: 80 mg via INTRAMUSCULAR

## 2023-07-18 NOTE — Assessment & Plan Note (Signed)
 Unfortunately I do think we have an exacerbation and do think that there is some spinal stenosis contributing.  Attempted osteopathic manipulation within the difficulty and was only unable to do more a muscle energy today.  Toradol and Depo-Medrol given today for the exacerbation and do feel an epidural could be beneficial.  We discussed with patient that icing regimen and home exercises otherwise.  Increase activity slowly.  Follow-up again in 6 to 8 weeks otherwise.

## 2023-07-18 NOTE — Patient Instructions (Signed)
See me again in 3 weeks 

## 2023-07-24 ENCOUNTER — Telehealth: Payer: Self-pay | Admitting: Family Medicine

## 2023-07-24 MED ORDER — HYDROXYZINE HCL 10 MG PO TABS: 10.0000 mg | ORAL_TABLET | Freq: Three times a day (TID) | ORAL | 0 refills | Status: DC | PRN

## 2023-07-24 NOTE — Telephone Encounter (Signed)
 Pt requesting refill of hydroxyzine to [facility].

## 2023-07-24 NOTE — Telephone Encounter (Signed)
 done

## 2023-07-27 ENCOUNTER — Telehealth: Payer: Self-pay | Admitting: *Deleted

## 2023-07-27 NOTE — Telephone Encounter (Signed)
1 year Ortho bundle call completed. ?

## 2023-08-08 ENCOUNTER — Other Ambulatory Visit (HOSPITAL_COMMUNITY): Payer: Self-pay

## 2023-08-08 NOTE — Progress Notes (Unsigned)
 [name] [address] [place] [address] Phone: [phone] Subjective:   Daniel [name], am serving as a scribe for Dr. Antoine Primas.  I'm seeing this patient by the request  of:  Tracey Harries, MD  CC: Back and neck pain  [id number]  Daniel [name] is a 73 y.o. male coming in with complaint of back and neck pain. OMT on 07/18/2023. Patient states thlat he is a lot better today than last visit.   Medications patient has been prescribed: Atarax  Taking: Intermittently     Working with physician for weight loss and has a goal weight of 185 pounds.    Reviewed prior external information including notes and imaging from previsou exam, outside providers and external EMR if available.   As well as notes that were available from care everywhere and other healthcare systems.  Past medical history, social, surgical and family history all reviewed in electronic medical record.  No pertanent information unless stated regarding to the chief complaint.   Past Medical History:  Diagnosis Date   Acne    Anxiety    DDD (degenerative disc disease), lumbosacral    chronic back pain   Diverticulosis of colon (without mention of hemorrhage)    colo 09/2010   History of atrial fibrillation 10/30/2016   Low back pain radiating to both legs     Allergies  Allergen Reactions   Durezol [Difluprednate] Swelling    These eye drops cause swelling     Review of Systems:  No headache, visual changes, nausea, vomiting, diarrhea, constipation, dizziness, abdominal pain, skin rash, fevers, chills, night sweats, weight loss, swollen lymph nodes, body aches, joint swelling, chest pain, shortness of breath, mood changes. POSITIVE muscle aches  Objective  Blood pressure 122/62, pulse 69, height 5\' 7"  (1.702 m), SpO2 99%.   General: No apparent distress alert and oriented x3 mood and affect normal, dressed appropriately.  HEENT: Pupils equal, extraocular  movements intact  Respiratory: Patient's speak in full sentences and does not appear short of breath  Cardiovascular: No lower extremity edema, non tender, no erythema  Gait relatively normal. MSK:  Back does have some loss lordosis noted.  Still some limited range of motion in all planes.  Negative straight leg test.  Only 5 degrees of extension of the back. Patient has lost weight and is doing well. Osteopathic findings  C2 flexed rotated and side bent right C6 flexed rotated and side bent left T3 extended rotated and side bent right inhaled rib T9 extended rotated and side bent left L2 flexed rotated and side bent right L3 flexed rotated and side bent left Sacrum right on right       Assessment and Plan:  DDD (degenerative disc disease), lumbosacral Patient is much better after the exacerbation that he had last time.  Discussed icing regimen and home exercises, discussed which activities to do and which ones to avoid.  Increase activity slowly.  Discussed icing regimen.  Follow-up with me again in 6 to 8 weeks otherwise.  Will continue to monitor with patient putting up a new ceiling in the near future that could cause another exacerbation if were not careful.  Patient knows to take breaks regularly.    Nonallopathic problems  Decision today to treat with OMT was based on Physical Exam  After verbal consent patient was treated with HVLA, ME, FPR techniques in cervical, rib, thoracic, lumbar, and sacral  areas  Patient tolerated the procedure well with improvement  in symptoms  Patient given exercises, stretches and lifestyle modifications  See medications in patient instructions if given  Patient will follow up in 4-8 weeks     The above documentation has been reviewed and is accurate and complete Judi Saa, DO         Note: This dictation was prepared with Dragon dictation along with smaller phrase technology. Any transcriptional errors that result from this  process are unintentional.

## 2023-08-09 ENCOUNTER — Ambulatory Visit (INDEPENDENT_AMBULATORY_CARE_PROVIDER_SITE_OTHER): Payer: Medicare Other | Admitting: Family Medicine

## 2023-08-09 ENCOUNTER — Encounter: Payer: Self-pay | Admitting: Family Medicine

## 2023-08-09 VITALS — BP 122/62 | HR 69 | Ht 67.0 in

## 2023-08-09 DIAGNOSIS — M9902 Segmental and somatic dysfunction of thoracic region: Secondary | ICD-10-CM | POA: Diagnosis not present

## 2023-08-09 DIAGNOSIS — M9901 Segmental and somatic dysfunction of cervical region: Secondary | ICD-10-CM | POA: Diagnosis not present

## 2023-08-09 DIAGNOSIS — M9904 Segmental and somatic dysfunction of sacral region: Secondary | ICD-10-CM | POA: Diagnosis not present

## 2023-08-09 DIAGNOSIS — M51372 Other intervertebral disc degeneration, lumbosacral region with discogenic back pain and lower extremity pain: Secondary | ICD-10-CM

## 2023-08-09 DIAGNOSIS — M9903 Segmental and somatic dysfunction of lumbar region: Secondary | ICD-10-CM

## 2023-08-09 DIAGNOSIS — M9908 Segmental and somatic dysfunction of rib cage: Secondary | ICD-10-CM

## 2023-08-09 NOTE — Assessment & Plan Note (Addendum)
 Patient is much better after the exacerbation that he had last time.  Discussed icing regimen and home exercises, discussed which activities to do and which ones to avoid.  Increase activity slowly.  Discussed icing regimen.  Follow-up with me again in 6 to 8 weeks otherwise.  Will continue to monitor with patient putting up a new ceiling in the near future that could cause another exacerbation if were not careful.  Patient knows to take breaks regularly.

## 2023-08-09 NOTE — Patient Instructions (Signed)
See you again in 4-6 weeks 

## 2023-09-11 NOTE — Progress Notes (Unsigned)
 [name] [address] [place] [address] Phone: [phone] Subjective:   Daniel [name], am serving as a scribe for Dr. Ronnell [name].  I'Daniel seeing this patient by the request  of:  Daniel Ina, MD  CC: back and neck pain follow up   [name]  Daniel [name] is a 73 y.o. male coming in with complaint of back and neck pain. OMT on 08/09/2023. Patient states that he did a lot of yard work and back is doing well. Stiff in mornings.   Medications patient has been prescribed: hydroxyzine   Taking:      On Zepbound for weight loss- down 20# per notes    Reviewed prior external information including notes and imaging from previsou exam, outside providers and external EMR if available.   As well as notes that were available from care everywhere and other healthcare systems.  Past medical history, social, surgical and family history all reviewed in electronic medical record.  No pertanent information unless stated regarding to the chief complaint.   Past Medical History:  Diagnosis Date   Acne    Anxiety    DDD (degenerative disc disease), lumbosacral    chronic back pain   Diverticulosis of colon (without mention of hemorrhage)    colo 09/2010   History of atrial fibrillation 10/30/2016   Low back pain radiating to both legs     Allergies  Allergen Reactions   Durezol [Difluprednate] Swelling    These eye drops cause swelling     Review of Systems:  No headache, visual changes, nausea, vomiting, diarrhea, constipation, dizziness, abdominal pain, skin rash, fevers, chills, night sweats, weight loss, swollen lymph nodes, body aches, joint swelling, chest pain, shortness of breath, mood changes. POSITIVE muscle aches  Objective  Blood pressure 120/78, pulse 66, height 5\' 7"  (1.702 Daniel), weight 200 lb (90.7 kg), SpO2 99%.   General: No apparent distress alert and oriented x3 mood and affect normal, dressed appropriately.  HEENT:  Pupils equal, extraocular movements intact  Respiratory: Patient's speak in full sentences and does not appear short of breath  Cardiovascular: No lower extremity edema, non tender, no erythema  Gait normal overall MSK:  Back tightness in the low back with some loss lordosis noted.  Some tenderness to palpation in different range of motion.  Tightness with extension of the back.  Osteopathic findings  C2 flexed rotated and side bent right C7 flexed rotated and side bent right T3 extended rotated and side bent right inhaled rib T9 extended rotated and side bent left L2 flexed rotated and side bent right L5 flexed rotated and side bent left Sacrum right on right       Assessment and Plan:  DDD (degenerative disc disease), lumbosacral Follow-up stable.  Patient send weight 290.  But that has been significantly beneficial.  Discussed icing regimen and home exercises, patient significantly seen continue to work on his weight loss.  Discussed icing regimen.  Follow-up again in 6 weeks otherwise.    Nonallopathic problems  Decision today to treat with OMT was based on Physical Exam  After verbal consent patient was treated with HVLA, ME, FPR techniques in cervical, rib, thoracic, lumbar, and sacral  areas  Patient tolerated the procedure well with improvement in symptoms  Patient given exercises, stretches and lifestyle modifications  See medications in patient instructions if given  Patient will follow up in 4-8 weeks    The above documentation has been reviewed and is accurate and  complete Daniel [name] Daniel Rivers Hamrick, DO          Note: This dictation was prepared with Dragon dictation along with smaller phrase technology. Any transcriptional errors that result from this process are unintentional.

## 2023-09-13 ENCOUNTER — Encounter: Payer: Self-pay | Admitting: Family Medicine

## 2023-09-13 ENCOUNTER — Ambulatory Visit (INDEPENDENT_AMBULATORY_CARE_PROVIDER_SITE_OTHER): Admitting: Family Medicine

## 2023-09-13 VITALS — BP 120/78 | HR 66 | Ht 67.0 in | Wt 200.0 lb

## 2023-09-13 DIAGNOSIS — M9903 Segmental and somatic dysfunction of lumbar region: Secondary | ICD-10-CM | POA: Diagnosis not present

## 2023-09-13 DIAGNOSIS — M9902 Segmental and somatic dysfunction of thoracic region: Secondary | ICD-10-CM | POA: Diagnosis not present

## 2023-09-13 DIAGNOSIS — M9901 Segmental and somatic dysfunction of cervical region: Secondary | ICD-10-CM | POA: Diagnosis not present

## 2023-09-13 DIAGNOSIS — M9904 Segmental and somatic dysfunction of sacral region: Secondary | ICD-10-CM

## 2023-09-13 DIAGNOSIS — M51372 Other intervertebral disc degeneration, lumbosacral region with discogenic back pain and lower extremity pain: Secondary | ICD-10-CM | POA: Diagnosis not present

## 2023-09-13 DIAGNOSIS — M9908 Segmental and somatic dysfunction of rib cage: Secondary | ICD-10-CM

## 2023-09-13 NOTE — Assessment & Plan Note (Signed)
 Follow-up stable.  Patient send weight 290.  But that has been significantly beneficial.  Discussed icing regimen and home exercises, patient significantly seen continue to work on his weight loss.  Discussed icing regimen.  Follow-up again in 6 weeks otherwise.

## 2023-09-13 NOTE — Patient Instructions (Signed)
 Great to see you Hope birds dont come back See me in 6 weeks

## 2023-10-24 NOTE — Progress Notes (Unsigned)
 [facility] [address] [place] [address] Phone: [phone] Subjective:   IBryan Caprio, am serving as a scribe for Dr. Ronnell Coins.  I'm seeing this patient by the request  of:  Alfredia Ina, MD  CC: Low back and neck pain follow-up  [name]  Navin Dogan is a 73 y.o. male coming in with complaint of back and neck pain. OMT on 09/13/2023. Patient states same per usual. Not sleeping very well.  Medications patient has been prescribed:   Taking:         Reviewed prior external information including notes and imaging from previsou exam, outside providers and external EMR if available.   As well as notes that were available from care everywhere and other healthcare systems.  Past medical history, social, surgical and family history all reviewed in electronic medical record.  No pertanent information unless stated regarding to the chief complaint.   Past Medical History:  Diagnosis Date   Acne    Anxiety    DDD (degenerative disc disease), lumbosacral    chronic back pain   Diverticulosis of colon (without mention of hemorrhage)    colo 09/2010   History of atrial fibrillation 10/30/2016   Low back pain radiating to both legs     Allergies  Allergen Reactions   Durezol [Difluprednate] Swelling    These eye drops cause swelling     Review of Systems:  No headache, visual changes, nausea, vomiting, diarrhea, constipation, dizziness, abdominal pain, skin rash, fevers, chills, night sweats, weight loss, swollen lymph nodes, body aches, joint swelling, chest pain, shortness of breath, mood changes. POSITIVE muscle aches  Objective  Blood pressure 124/82, pulse 69, height 5\' 7"  (1.702 m), weight 197 lb (89.4 kg), SpO2 98%.   General: No apparent distress alert and oriented x3 mood and affect normal, dressed appropriately.  HEENT: Pupils equal, extraocular movements intact  Respiratory: Patient's speak in full sentences  and does not appear short of breath  Cardiovascular: No lower extremity edema, non tender, no erythema  Gait MSK:  Back improvement noted in the back overall.  Still has some tightness with Veldon German right greater than left.  Patient is able to get out of a seated position significantly easier than last exam.  Osteopathic findings  C1 flexed rotated and side bent right C7 flexed rotated and side bent left T3 extended rotated and side bent right inhaled rib T9 extended rotated and side bent left L2 flexed rotated and side bent right L4 flexed rotated and side bent left Sacrum right on right       Assessment and Plan:  Degenerative disc disease, cervical Degenerative disc disease, continue with the home exercises and icing regimen.  Patient has done great with weight loss.  Increase activity slowly.  Follow-up again in 6 to 8 weeks otherwise.  DDD (degenerative disc disease), lumbosacral Does have some loss of lordosis.  Has been doing better overall with the weight loss.  We discussed icing regimen and home exercises.  Increase activity slowly.  No change in medicine but does have the Zanaflex  for any type of breakthrough pain.    Nonallopathic problems  Decision today to treat with OMT was based on Physical Exam  After verbal consent patient was treated with HVLA, ME, FPR techniques in cervical, rib, thoracic, lumbar, and sacral  areas  Patient tolerated the procedure well with improvement in symptoms  Patient given exercises, stretches and lifestyle modifications  See medications in patient instructions  if given  Patient will follow up in 4-8 weeks     The above documentation has been reviewed and is accurate and complete Quintell Bonnin M Jayshon Dommer, DO         Note: This dictation was prepared with Dragon dictation along with smaller phrase technology. Any transcriptional errors that result from this process are unintentional.

## 2023-10-25 ENCOUNTER — Ambulatory Visit (INDEPENDENT_AMBULATORY_CARE_PROVIDER_SITE_OTHER): Admitting: Family Medicine

## 2023-10-25 ENCOUNTER — Encounter: Payer: Self-pay | Admitting: Family Medicine

## 2023-10-25 VITALS — BP 124/82 | HR 69 | Ht 67.0 in | Wt 197.0 lb

## 2023-10-25 DIAGNOSIS — M51372 Other intervertebral disc degeneration, lumbosacral region with discogenic back pain and lower extremity pain: Secondary | ICD-10-CM

## 2023-10-25 DIAGNOSIS — M9903 Segmental and somatic dysfunction of lumbar region: Secondary | ICD-10-CM | POA: Diagnosis not present

## 2023-10-25 DIAGNOSIS — M9904 Segmental and somatic dysfunction of sacral region: Secondary | ICD-10-CM | POA: Diagnosis not present

## 2023-10-25 DIAGNOSIS — M5030 Other cervical disc degeneration, unspecified cervical region: Secondary | ICD-10-CM | POA: Diagnosis not present

## 2023-10-25 DIAGNOSIS — F5101 Primary insomnia: Secondary | ICD-10-CM | POA: Diagnosis not present

## 2023-10-25 DIAGNOSIS — M9908 Segmental and somatic dysfunction of rib cage: Secondary | ICD-10-CM | POA: Diagnosis not present

## 2023-10-25 DIAGNOSIS — M9901 Segmental and somatic dysfunction of cervical region: Secondary | ICD-10-CM | POA: Diagnosis not present

## 2023-10-25 DIAGNOSIS — M9902 Segmental and somatic dysfunction of thoracic region: Secondary | ICD-10-CM

## 2023-10-25 DIAGNOSIS — G4700 Insomnia, unspecified: Secondary | ICD-10-CM | POA: Insufficient documentation

## 2023-10-25 MED ORDER — TRAZODONE HCL 50 MG PO TABS: 25.0000 mg | ORAL_TABLET | Freq: Every evening | ORAL | 0 refills | Status: DC | PRN

## 2023-10-25 NOTE — Assessment & Plan Note (Signed)
 Does have some loss of lordosis.  Has been doing better overall with the weight loss.  We discussed icing regimen and home exercises.  Increase activity slowly.  No change in medicine but does have the Zanaflex  for any type of breakthrough pain.

## 2023-10-25 NOTE — Patient Instructions (Addendum)
 Trazodone  50mg  to take a night if 2 hydroxyzine  doesn't work Be careful of deer horns See you again in 2 months

## 2023-10-25 NOTE — Assessment & Plan Note (Signed)
 New problem, tried trazodone .  Prescribed and discussed potential side effects

## 2023-10-25 NOTE — Assessment & Plan Note (Signed)
 Degenerative disc disease, continue with the home exercises and icing regimen.  Patient has done great with weight loss.  Increase activity slowly.  Follow-up again in 6 to 8 weeks otherwise.

## 2023-12-20 ENCOUNTER — Other Ambulatory Visit: Payer: Self-pay | Admitting: Family Medicine

## 2023-12-21 ENCOUNTER — Telehealth: Payer: Self-pay | Admitting: Family Medicine

## 2023-12-21 NOTE — Progress Notes (Signed)
 [name] [address] [place] [address] Phone: [phone] Subjective:   Daniel [name], am serving as a scribe for Dr. Arthea Claudene.  I'[name] seeing this patient by the request  of:  Pura Lenis, MD  CC: Low back exam shows some loss of doses  YEP:Daniel [name]  Daniel [name] is a 73 y.o. male coming in with complaint of back and neck pain. OMT on 10/25/2023. Patient states that he is stiff in the mornings. 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  Has been having to feel okay.  Using a lot of mechanical tools.  Medications patient has been prescribed: Trazodone   Taking:         Reviewed prior external information including notes and imaging from previsou exam, outside providers and external EMR if available.   As well as notes that were available from care everywhere and other healthcare systems.  Past medical history, social, surgical and family history all reviewed in electronic medical record.  No pertanent information unless stated regarding to the chief complaint.   Past Medical History:  Diagnosis Date   Acne    Anxiety    DDD (degenerative disc disease), lumbosacral    chronic back pain   Diverticulosis of colon (without mention of hemorrhage)    colo 09/2010   History of atrial fibrillation 10/30/2016   Low back pain radiating to both legs     Allergies  Allergen Reactions   Durezol [Difluprednate] Swelling    These eye drops cause swelling     Review of Systems:  No headache, visual changes, nausea, vomiting, diarrhea, constipation, dizziness, abdominal pain, skin rash, fevers, chills, night sweats, weight loss, swollen lymph nodes, body aches, joint swelling, chest pain, shortness of breath, mood changes. POSITIVE muscle aches  Objective  Blood pressure 110/74, pulse (!) 58, height 5' 7 (1.702 [name]), weight 195 lb (88.5 kg), SpO2 98%.   General: No apparent distress alert and oriented x3 mood and affect  normal, dressed appropriately.  HEENT: Pupils equal, extraocular movements intact  Respiratory: Patient's speak in full sentences and does not appear short of breath  Cardiovascular: No lower extremity edema, non tender, no erythema  Gait MSK:  Back does have some loss lordosis noted.  Some tenderness to palpation in the paraspinal musculature.  Tightness right greater than left.  Osteopathic findings  C2 flexed rotated and side bent right C6 flexed rotated and side bent left T3 extended rotated and side bent right inhaled rib T9 extended rotated and side bent left L2 flexed rotated and side bent right L4 flexed rotated and side bent right Sacrum right on right       Assessment and Plan:  DDD (degenerative disc disease), lumbosacral Continues tightness in.  Has been dealing with a well over 13 years at this moment.  Still not stopping them completely.  Has significant tightness in the mornings patient states.  Discussed with patient about the possible need for another epidural.  Patient wants to hold on that at this moment.  Discussed icing regimen of home exercises, increase activity slowly otherwise.  Continue to work on core strengthening.  Follow-up with me again in 6 to 8 weeks    Nonallopathic problems  Decision today to treat with OMT was based on Physical Exam  After verbal consent patient was treated with HVLA, ME, FPR techniques in cervical, rib, thoracic, lumbar, and sacral  areas, only muscle energy on the cervical region.  Patient tolerated  the procedure well with improvement in symptoms  Patient given exercises, stretches and lifestyle modifications  See medications in patient instructions if given  Patient will follow up in 4-8 weeks     The above documentation has been reviewed and is accurate and complete Daniel [name] Daniel Razzano, DO         Note: This dictation was prepared with Dragon dictation along with smaller phrase technology. Any transcriptional  errors that result from this process are unintentional.

## 2023-12-21 NOTE — Telephone Encounter (Signed)
 Filled earlier today

## 2023-12-21 NOTE — Telephone Encounter (Signed)
 Patient called and said that his pharmacy sent a refill request for trazodone  and he was just ensuring that we received it from the pharmacy. Please advise.

## 2023-12-26 ENCOUNTER — Encounter: Payer: Self-pay | Admitting: Family Medicine

## 2023-12-26 ENCOUNTER — Ambulatory Visit (INDEPENDENT_AMBULATORY_CARE_PROVIDER_SITE_OTHER): Admitting: Family Medicine

## 2023-12-26 VITALS — BP 110/74 | HR 58 | Ht 67.0 in | Wt 195.0 lb

## 2023-12-26 DIAGNOSIS — M9901 Segmental and somatic dysfunction of cervical region: Secondary | ICD-10-CM

## 2023-12-26 DIAGNOSIS — M9902 Segmental and somatic dysfunction of thoracic region: Secondary | ICD-10-CM

## 2023-12-26 DIAGNOSIS — M9908 Segmental and somatic dysfunction of rib cage: Secondary | ICD-10-CM

## 2023-12-26 DIAGNOSIS — M9903 Segmental and somatic dysfunction of lumbar region: Secondary | ICD-10-CM

## 2023-12-26 DIAGNOSIS — M9904 Segmental and somatic dysfunction of sacral region: Secondary | ICD-10-CM | POA: Diagnosis not present

## 2023-12-26 DIAGNOSIS — M51372 Other intervertebral disc degeneration, lumbosacral region with discogenic back pain and lower extremity pain: Secondary | ICD-10-CM

## 2023-12-26 NOTE — Patient Instructions (Signed)
 Good to see you You are doing amazing on the weight See me in 6-7 weeks

## 2023-12-26 NOTE — Assessment & Plan Note (Signed)
 Continues tightness in.  Has been dealing with a well over 13 years at this moment.  Still not stopping them completely.  Has significant tightness in the mornings patient states.  Discussed with patient about the possible need for another epidural.  Patient wants to hold on that at this moment.  Discussed icing regimen of home exercises, increase activity slowly otherwise.  Continue to work on core strengthening.  Follow-up with me again in 6 to 8 weeks

## 2024-01-01 ENCOUNTER — Other Ambulatory Visit: Payer: Self-pay

## 2024-01-01 ENCOUNTER — Telehealth: Payer: Self-pay | Admitting: Family Medicine

## 2024-01-01 DIAGNOSIS — M5416 Radiculopathy, lumbar region: Secondary | ICD-10-CM

## 2024-01-01 NOTE — Telephone Encounter (Signed)
 Patient called asking if another epidural could be ordered for him? He said that after this weekend and installing a rug, his back is really bothering him so he would like to proceed with the injection.

## 2024-01-01 NOTE — Telephone Encounter (Signed)
 Left message for patient that epidural order was placed.

## 2024-01-08 NOTE — Discharge Instructions (Signed)

## 2024-01-09 ENCOUNTER — Ambulatory Visit
Admission: RE | Admit: 2024-01-09 | Discharge: 2024-01-09 | Disposition: A | Discharge status: Home / Self Care | Source: Ambulatory Visit | Attending: Family Medicine | Admitting: Family Medicine

## 2024-01-09 DIAGNOSIS — M5416 Radiculopathy, lumbar region: Secondary | ICD-10-CM

## 2024-01-09 MED ORDER — IOPAMIDOL (ISOVUE-M 200) INJECTION 41%
1.0000 mL | Freq: Once | INTRAMUSCULAR | Status: AC
  Administered 2024-01-09: 1 mL via EPIDURAL

## 2024-01-09 MED ORDER — METHYLPREDNISOLONE ACETATE 40 MG/ML INJ SUSP (RADIOLOG
80.0000 mg | Freq: Once | INTRAMUSCULAR | Status: AC
  Administered 2024-01-09: 80 mg via EPIDURAL

## 2024-01-17 ENCOUNTER — Other Ambulatory Visit: Payer: Self-pay | Admitting: Family Medicine

## 2024-02-08 NOTE — Progress Notes (Deleted)
  [name] [address] [place] [address] Phone: [phone] Subjective:    I'm seeing this patient by the request  of:  Pura Lenis, MD  CC:   YEP:[id number]  Daniel [name] is a 73 y.o. male coming in with complaint of back and neck pain. OMT 12/26/2023. Patient states   Medications patient has been prescribed: Trazadone  Taking:         Reviewed prior external information including notes and imaging from previsou exam, outside providers and external EMR if available.   As well as notes that were available from care everywhere and other healthcare systems.  Past medical history, social, surgical and family history all reviewed in electronic medical record.  No pertanent information unless stated regarding to the chief complaint.   Past Medical History:  Diagnosis Date   Acne    Anxiety    DDD (degenerative disc disease), lumbosacral    chronic back pain   Diverticulosis of colon (without mention of hemorrhage)    colo 09/2010   History of atrial fibrillation 10/30/2016   Low back pain radiating to both legs     Allergies  Allergen Reactions   Durezol [Difluprednate] Swelling    These eye drops cause swelling     Review of Systems:  No headache, visual changes, nausea, vomiting, diarrhea, constipation, dizziness, abdominal pain, skin rash, fevers, chills, night sweats, weight loss, swollen lymph nodes, body aches, joint swelling, chest pain, shortness of breath, mood changes. POSITIVE muscle aches  Objective  There were no vitals taken for this visit.   General: No apparent distress alert and oriented x3 mood and affect normal, dressed appropriately.  HEENT: Pupils equal, extraocular movements intact  Respiratory: Patient's speak in full sentences and does not appear short of breath  Cardiovascular: No lower extremity edema, non tender, no erythema  Gait MSK:  Back   Osteopathic findings  C2 flexed rotated and side  bent right C6 flexed rotated and side bent left T3 extended rotated and side bent right inhaled rib T9 extended rotated and side bent left L2 flexed rotated and side bent right Sacrum right on right       Assessment and Plan:  No problem-specific Assessment & Plan notes found for this encounter.    Nonallopathic problems  Decision today to treat with OMT was based on Physical Exam  After verbal consent patient was treated with HVLA, ME, FPR techniques in cervical, rib, thoracic, lumbar, and sacral  areas  Patient tolerated the procedure well with improvement in symptoms  Patient given exercises, stretches and lifestyle modifications  See medications in patient instructions if given  Patient will follow up in 4-8 weeks    The above documentation has been reviewed and is accurate and complete Daniel Poulton M Ignatz Deis, DO          Note: This dictation was prepared with Dragon dictation along with smaller phrase technology. Any transcriptional errors that result from this process are unintentional.

## 2024-02-12 ENCOUNTER — Ambulatory Visit: Admitting: Family Medicine

## 2024-02-28 NOTE — Progress Notes (Unsigned)
 [name] [address] [place] [address] Phone: [phone] Subjective:   Daniel [name], am serving as a scribe for Dr. Arthea [name].  I'm seeing this patient by the request  of:  Daniel Lenis, MD  CC: Low back pain follow-up  YEP:Dlagzrupcz  Daniel [name] is a 73 y.o. male coming in with complaint of back and neck pain. OMT 12/26/2023. Patient states last week had a bunch of pain on R groin area. Had some trouble lifting leg.  Medications patient has been prescribed: Trazadone  Taking: Continue on his weight loss journey         Reviewed prior external information including notes and imaging from previsou exam, outside providers and external EMR if available.   As well as notes that were available from care everywhere and other healthcare systems.  Past medical history, social, surgical and family history all reviewed in electronic medical record.  No pertanent information unless stated regarding to the chief complaint.   Past Medical History:  Diagnosis Date   Acne    Anxiety    DDD (degenerative disc disease), lumbosacral    chronic back pain   Diverticulosis of colon (without mention of hemorrhage)    colo 09/2010   History of atrial fibrillation 10/30/2016   Low back pain radiating to both legs     Allergies  Allergen Reactions   Durezol [Difluprednate] Swelling    These eye drops cause swelling     Review of Systems:  No headache, visual changes, nausea, vomiting, diarrhea, constipation, dizziness, abdominal pain, skin rash, fevers, chills, night sweats, weight loss, swollen lymph nodes, body aches, joint swelling, chest pain, shortness of breath, mood changes. POSITIVE muscle aches  Objective  Blood pressure 104/66, pulse 69, height 5' 7 (1.702 m), weight 199 lb (90.3 kg), SpO2 97%.   General: No apparent distress alert and oriented x3 mood and affect normal, dressed appropriately.  HEENT: Pupils equal,  extraocular movements intact  Respiratory: Patient's speak in full sentences and does not appear short of breath  Cardiovascular: No lower extremity edema, non tender, no erythema  MSK:  Back low back does have some loss lordosis. Tenderness to palpation over the greater trochanteric area.  Positive Deri noted.  Osteopathic findings  C2 flexed rotated and side bent right C7 flexed rotated and side bent left T3 extended rotated and side bent right inhaled rib T9 extended rotated and side bent left L2 flexed rotated and side bent right L4 flexed rotated and side bent left Sacrum right on right     Assessment and Plan:  Greater trochanteric bursitis of right hip Patient is getting recurrent greater trochanteric bursitis.  Has been sometime since patient has had this problem.  We discussed the potential for another injection with that being greater than 2 years.  Patient wants to try icing and home exercises.  Worsening pain will consider the possibility of injection at follow-up in 2 months.  Follow-up again in 6 to 8 weeks  DDD (degenerative disc disease), lumbosacral Known arthritic changes, differential includes lumbar radiculopathy.  Discussed icing regimen and home exercises.  Discussed which activities to do and which ones to avoid.  Increase activity slowly.  Follow-up again in 6 to 8 weeks otherwise.    Nonallopathic problems  Decision today to treat with OMT was based on Physical Exam  After verbal consent patient was treated with HVLA, ME, FPR techniques in cervical, rib, thoracic, lumbar, and sacral  areas  Patient  tolerated the procedure well with improvement in symptoms  Patient given exercises, stretches and lifestyle modifications  See medications in patient instructions if given  Patient will follow up in 4-8 weeks     The above documentation has been reviewed and is accurate and complete Daniel Comes M Fancy Dunkley, DO         Note: This dictation was prepared  with Dragon dictation along with smaller phrase technology. Any transcriptional errors that result from this process are unintentional.

## 2024-03-04 ENCOUNTER — Encounter: Payer: Self-pay | Admitting: Family Medicine

## 2024-03-04 ENCOUNTER — Ambulatory Visit: Admitting: Family Medicine

## 2024-03-04 VITALS — BP 104/66 | HR 69 | Ht 67.0 in | Wt 199.0 lb

## 2024-03-04 DIAGNOSIS — M9903 Segmental and somatic dysfunction of lumbar region: Secondary | ICD-10-CM | POA: Diagnosis not present

## 2024-03-04 DIAGNOSIS — M51372 Other intervertebral disc degeneration, lumbosacral region with discogenic back pain and lower extremity pain: Secondary | ICD-10-CM

## 2024-03-04 DIAGNOSIS — M9901 Segmental and somatic dysfunction of cervical region: Secondary | ICD-10-CM

## 2024-03-04 DIAGNOSIS — M9902 Segmental and somatic dysfunction of thoracic region: Secondary | ICD-10-CM

## 2024-03-04 DIAGNOSIS — M7061 Trochanteric bursitis, right hip: Secondary | ICD-10-CM

## 2024-03-04 DIAGNOSIS — M9908 Segmental and somatic dysfunction of rib cage: Secondary | ICD-10-CM | POA: Diagnosis not present

## 2024-03-04 DIAGNOSIS — M9904 Segmental and somatic dysfunction of sacral region: Secondary | ICD-10-CM | POA: Diagnosis not present

## 2024-03-04 NOTE — Assessment & Plan Note (Signed)
 Known arthritic changes, differential includes lumbar radiculopathy.  Discussed icing regimen and home exercises.  Discussed which activities to do and which ones to avoid.  Increase activity slowly.  Follow-up again in 6 to 8 weeks otherwise.

## 2024-03-04 NOTE — Assessment & Plan Note (Signed)
 Patient is getting recurrent greater trochanteric bursitis.  Has been sometime since patient has had this problem.  We discussed the potential for another injection with that being greater than 2 years.  Patient wants to try icing and home exercises.  Worsening pain will consider the possibility of injection at follow-up in 2 months.  Follow-up again in 6 to 8 weeks

## 2024-03-04 NOTE — Patient Instructions (Signed)
 Do prescribed exercises at least 3x a week Ice after activity Check out the shoes See you again in 2-3 months

## 2024-03-12 ENCOUNTER — Telehealth: Payer: Self-pay

## 2024-03-12 ENCOUNTER — Other Ambulatory Visit: Payer: Self-pay

## 2024-03-12 MED ORDER — HYDROXYZINE HCL 10 MG PO TABS: 10.0000 mg | ORAL_TABLET | Freq: Three times a day (TID) | ORAL | 0 refills | Status: AC | PRN

## 2024-03-12 NOTE — Telephone Encounter (Signed)
 Attempted to call patient.  No answer.

## 2024-03-12 NOTE — Telephone Encounter (Signed)
Spoke with patient per Dr. Smith's recommendations. 

## 2024-03-20 ENCOUNTER — Telehealth: Payer: Self-pay | Admitting: Family Medicine

## 2024-03-20 ENCOUNTER — Other Ambulatory Visit: Payer: Self-pay

## 2024-03-20 MED ORDER — TRAZODONE HCL 50 MG PO TABS: 25.0000 mg | ORAL_TABLET | Freq: Every evening | ORAL | 0 refills | Status: DC | PRN

## 2024-03-20 NOTE — Telephone Encounter (Signed)
 Rx filled

## 2024-03-20 NOTE — Telephone Encounter (Signed)
 Pt calling for refill of Trazadone, states pharmacy sent fax request yesterday but I do not recall seeing this.

## 2024-04-04 ENCOUNTER — Telehealth: Payer: Self-pay | Admitting: Family Medicine

## 2024-04-04 ENCOUNTER — Other Ambulatory Visit: Payer: Self-pay

## 2024-04-04 DIAGNOSIS — M5416 Radiculopathy, lumbar region: Secondary | ICD-10-CM

## 2024-04-04 NOTE — Telephone Encounter (Signed)
Epidural ordered. Patient notified.  

## 2024-04-04 NOTE — Telephone Encounter (Signed)
 Patient asked if another epidural order could be put in for him?  Please advise.

## 2024-04-16 ENCOUNTER — Telehealth: Payer: Self-pay | Admitting: Family Medicine

## 2024-04-16 ENCOUNTER — Other Ambulatory Visit: Payer: Self-pay

## 2024-04-16 ENCOUNTER — Other Ambulatory Visit: Payer: Self-pay | Admitting: Family Medicine

## 2024-04-16 MED ORDER — TRAZODONE HCL 50 MG PO TABS: 25.0000 mg | ORAL_TABLET | Freq: Every evening | ORAL | 0 refills | Status: DC | PRN

## 2024-04-16 NOTE — Telephone Encounter (Signed)
 Patient called to check to see if we have received the refill request from his pharmacy for trazodone . He has enough to last until Thursday of this week. Please advise.

## 2024-04-26 NOTE — Discharge Instructions (Signed)

## 2024-04-29 ENCOUNTER — Inpatient Hospital Stay
Admission: RE | Admit: 2024-04-29 | Discharge: 2024-04-29 | Disposition: A | Source: Ambulatory Visit | Attending: Family Medicine | Admitting: Family Medicine

## 2024-04-29 DIAGNOSIS — M5416 Radiculopathy, lumbar region: Secondary | ICD-10-CM

## 2024-04-29 MED ORDER — METHYLPREDNISOLONE ACETATE 40 MG/ML INJ SUSP (RADIOLOG
80.0000 mg | Freq: Once | INTRAMUSCULAR | Status: AC
  Administered 2024-04-29: 80 mg via EPIDURAL

## 2024-04-29 MED ORDER — IOPAMIDOL (ISOVUE-M 200) INJECTION 41%
1.0000 mL | Freq: Once | INTRAMUSCULAR | Status: AC
  Administered 2024-04-29: 1 mL via EPIDURAL

## 2024-05-02 NOTE — Progress Notes (Signed)
 [name] [address] [place] [address] Phone: [phone] Subjective:   Daniel [name], am serving as a scribe for Dr. Arthea Claudene.  I'm seeing this patient by the request  of:  Pura Lenis, MD  CC: Back and neck pain follow-up  YEP:Dlagzrupcz  Daniel [name] is a 73 y.o. male coming in with complaint of back and neck pain. OMT 03/04/2024. Patient states that he had epidural last week. Back is doing great.   Medications patient has been prescribed: Hydroxizine, Trazadone  Taking:         Reviewed prior external information including notes and imaging from previsou exam, outside providers and external EMR if available.   As well as notes that were available from care everywhere and other healthcare systems.  Past medical history, social, surgical and family history all reviewed in electronic medical record.  No pertanent information unless stated regarding to the chief complaint.   Past Medical History:  Diagnosis Date   Acne    Anxiety    DDD (degenerative disc disease), lumbosacral    chronic back pain   Diverticulosis of colon (without mention of hemorrhage)    colo 09/2010   History of atrial fibrillation 10/30/2016   Low back pain radiating to both legs     Allergies[1]   Review of Systems:  No headache, visual changes, nausea, vomiting, diarrhea, constipation, dizziness, abdominal pain, skin rash, fevers, chills, night sweats, weight loss, swollen lymph nodes, body aches, joint swelling, chest pain, shortness of breath, mood changes. POSITIVE muscle aches  Objective  Blood pressure 120/82, pulse 63, height 5' 7 (1.702 m), weight 198 lb (89.8 kg), SpO2 99%.   General: No apparent distress alert and oriented x3 mood and affect normal, dressed appropriately.  HEENT: Pupils equal, extraocular movements intact  Respiratory: Patient's speak in full sentences and does not appear short of breath  Cardiovascular: No  lower extremity edema, non tender, no erythema  Gait MSK:  Back very mild loss of lordosis noted.  Tightness noted.  Mild over the greater trochanteric area in the gluteal area. Osteopathic findings  C2 flexed rotated and side bent right C6 flexed rotated and side bent left T3 extended rotated and side bent right inhaled rib T9 extended rotated and side bent left L2 flexed rotated and side bent right Sacrum right on right       Assessment and Plan:  DDD (degenerative disc disease), lumbosacral Significant improvement after the epidural.  Discussed icing regimen and home exercises, discussed which activities to do and which ones to avoid.  Increase activity slowly.  Discussed icing regimen.  Follow-up again in 2 to 3 months    Nonallopathic problems  Decision today to treat with OMT was based on Physical Exam  After verbal consent patient was treated with HVLA, ME, FPR techniques in cervical, rib, thoracic, lumbar, and sacral  areas  Patient tolerated the procedure well with improvement in symptoms  Patient given exercises, stretches and lifestyle modifications  See medications in patient instructions if given  Patient will follow up in 4-8 weeks      The above documentation has been reviewed and is accurate and complete Daniel [name] M Daniel Hoel, DO        Note: This dictation was prepared with Dragon dictation along with smaller phrase technology. Any transcriptional errors that result from this process are unintentional.            [1]  Allergies Allergen Reactions  Durezol [Difluprednate] Swelling    These eye drops cause swelling

## 2024-05-06 ENCOUNTER — Encounter: Payer: Self-pay | Admitting: Family Medicine

## 2024-05-06 ENCOUNTER — Ambulatory Visit: Admitting: Family Medicine

## 2024-05-06 VITALS — BP 120/82 | HR 63 | Ht 67.0 in | Wt 198.0 lb

## 2024-05-06 DIAGNOSIS — M51372 Other intervertebral disc degeneration, lumbosacral region with discogenic back pain and lower extremity pain: Secondary | ICD-10-CM

## 2024-05-06 DIAGNOSIS — M9908 Segmental and somatic dysfunction of rib cage: Secondary | ICD-10-CM

## 2024-05-06 DIAGNOSIS — M9903 Segmental and somatic dysfunction of lumbar region: Secondary | ICD-10-CM

## 2024-05-06 DIAGNOSIS — M9901 Segmental and somatic dysfunction of cervical region: Secondary | ICD-10-CM | POA: Diagnosis not present

## 2024-05-06 DIAGNOSIS — M9904 Segmental and somatic dysfunction of sacral region: Secondary | ICD-10-CM

## 2024-05-06 DIAGNOSIS — M9902 Segmental and somatic dysfunction of thoracic region: Secondary | ICD-10-CM

## 2024-05-06 NOTE — Assessment & Plan Note (Signed)
 Significant improvement after the epidural.  Discussed icing regimen and home exercises, discussed which activities to do and which ones to avoid.  Increase activity slowly.  Discussed icing regimen.  Follow-up again in 2 to 3 months

## 2024-05-06 NOTE — Patient Instructions (Signed)
 Great to see you Thank you for ruining my waistline See me in 8 weeks

## 2024-05-22 ENCOUNTER — Telehealth: Payer: Self-pay | Admitting: Family Medicine

## 2024-05-22 ENCOUNTER — Other Ambulatory Visit: Payer: Self-pay

## 2024-05-22 MED ORDER — TRAZODONE HCL 50 MG PO TABS: 25.0000 mg | ORAL_TABLET | Freq: Every evening | ORAL | 0 refills | Status: AC | PRN

## 2024-05-22 NOTE — Telephone Encounter (Signed)
"  Rx filled.   "

## 2024-05-22 NOTE — Telephone Encounter (Signed)
 Patient called requesting a refill on: traZODone  (DESYREL ) 50 MG tablet   Pharmacy: [facility]  (The pharmacy may be sending over a request as well.)

## 2024-06-28 NOTE — Progress Notes (Unsigned)
" °  [name] [address] [place] [address] Phone: [phone] Subjective:    I'm seeing this patient by the request  of:  Pura Lenis, MD  CC:   YEP:Dlagzrupcz  Daniel [name] is a 74 y.o. male coming in with complaint of back and neck pain. OMT 05/06/2024. Patient states   Medications patient has been prescribed: Hydroxizine, Trazadone  Taking:         Reviewed prior external information including notes and imaging from previsou exam, outside providers and external EMR if available.   As well as notes that were available from care everywhere and other healthcare systems.  Past medical history, social, surgical and family history all reviewed in electronic medical record.  No pertanent information unless stated regarding to the chief complaint.   Past Medical History:  Diagnosis Date   Acne    Anxiety    DDD (degenerative disc disease), lumbosacral    chronic back pain   Diverticulosis of colon (without mention of hemorrhage)    colo 09/2010   History of atrial fibrillation 10/30/2016   Low back pain radiating to both legs     Allergies[1]   Review of Systems:  No headache, visual changes, nausea, vomiting, diarrhea, constipation, dizziness, abdominal pain, skin rash, fevers, chills, night sweats, weight loss, swollen lymph nodes, body aches, joint swelling, chest pain, shortness of breath, mood changes. POSITIVE muscle aches  Objective  There were no vitals taken for this visit.   General: No apparent distress alert and oriented x3 mood and affect normal, dressed appropriately.  HEENT: Pupils equal, extraocular movements intact  Respiratory: Patient's speak in full sentences and does not appear short of breath  Cardiovascular: No lower extremity edema, non tender, no erythema  Gait MSK:  Back   Osteopathic findings  C2 flexed rotated and side bent right C6 flexed rotated and side bent left T3 extended rotated and  side bent right inhaled rib T9 extended rotated and side bent left L2 flexed rotated and side bent right Sacrum right on right       Assessment and Plan:  No problem-specific Assessment & Plan notes found for this encounter.    Nonallopathic problems  Decision today to treat with OMT was based on Physical Exam  After verbal consent patient was treated with HVLA, ME, FPR techniques in cervical, rib, thoracic, lumbar, and sacral  areas  Patient tolerated the procedure well with improvement in symptoms  Patient given exercises, stretches and lifestyle modifications  See medications in patient instructions if given  Patient will follow up in 4-8 weeks             Note: This dictation was prepared with Dragon dictation along with smaller phrase technology. Any transcriptional errors that result from this process are unintentional.            [1]  Allergies Allergen Reactions   Durezol [Difluprednate] Swelling    These eye drops cause swelling   "

## 2024-07-01 ENCOUNTER — Ambulatory Visit: Admitting: Family Medicine
